# Patient Record
Sex: Female | Born: 1950 | Race: White | Hispanic: No | Marital: Married | Smoking: Former smoker
Health system: Southern US, Community
[De-identification: ages and names within clinical notes are randomized; demographics above are authoritative.]

## PROBLEM LIST (undated history)

## (undated) DIAGNOSIS — I519 Heart disease, unspecified: Secondary | ICD-10-CM

## (undated) DIAGNOSIS — E079 Disorder of thyroid, unspecified: Secondary | ICD-10-CM

## (undated) HISTORY — PX: PACEMAKER PLACEMENT: SHX43

## (undated) HISTORY — PX: CARDIAC DEFIBRILLATOR PLACEMENT: SHX171

## (undated) HISTORY — DX: Heart disease, unspecified: I51.9

## (undated) HISTORY — DX: Disorder of thyroid, unspecified: E07.9

## (undated) HISTORY — PX: CARDIAC ELECTROPHYSIOLOGY STUDY AND ABLATION: SHX1294

---

## 2008-07-22 ENCOUNTER — Ambulatory Visit: Payer: Self-pay | Admitting: Diagnostic Radiology

## 2008-07-22 ENCOUNTER — Emergency Department (HOSPITAL_BASED_OUTPATIENT_CLINIC_OR_DEPARTMENT_OTHER): Admission: EM | Admit: 2008-07-22 | Discharge: 2008-07-22 | Payer: Self-pay | Admitting: Emergency Medicine

## 2009-05-12 LAB — TSH: TSH: 2.08 u[IU]/mL (ref 0.41–5.90)

## 2009-05-12 LAB — LIPID PANEL: Triglycerides: 91 mg/dL (ref 40–160)

## 2009-05-12 LAB — HEPATIC FUNCTION PANEL
ALT: 22 U/L (ref 7–35)
AST: 18 U/L (ref 13–35)

## 2010-06-05 DIAGNOSIS — M79609 Pain in unspecified limb: Secondary | ICD-10-CM | POA: Insufficient documentation

## 2010-06-05 DIAGNOSIS — G609 Hereditary and idiopathic neuropathy, unspecified: Secondary | ICD-10-CM | POA: Insufficient documentation

## 2010-06-05 LAB — HEMOGLOBIN A1C: Hgb A1c MFr Bld: 5.9 % (ref 4.0–6.0)

## 2010-06-06 DIAGNOSIS — E559 Vitamin D deficiency, unspecified: Secondary | ICD-10-CM | POA: Insufficient documentation

## 2011-12-02 ENCOUNTER — Ambulatory Visit (INDEPENDENT_AMBULATORY_CARE_PROVIDER_SITE_OTHER): Payer: Medicare Other | Admitting: Family Medicine

## 2011-12-02 ENCOUNTER — Encounter: Payer: Self-pay | Admitting: Family Medicine

## 2011-12-02 VITALS — BP 120/73 | HR 91 | Ht 64.0 in | Wt 237.0 lb

## 2011-12-02 DIAGNOSIS — I519 Heart disease, unspecified: Secondary | ICD-10-CM

## 2011-12-02 DIAGNOSIS — E559 Vitamin D deficiency, unspecified: Secondary | ICD-10-CM

## 2011-12-02 DIAGNOSIS — F341 Dysthymic disorder: Secondary | ICD-10-CM

## 2011-12-02 DIAGNOSIS — E039 Hypothyroidism, unspecified: Secondary | ICD-10-CM

## 2011-12-02 DIAGNOSIS — F418 Other specified anxiety disorders: Secondary | ICD-10-CM

## 2011-12-02 DIAGNOSIS — Z13220 Encounter for screening for lipoid disorders: Secondary | ICD-10-CM

## 2011-12-02 DIAGNOSIS — Z950 Presence of cardiac pacemaker: Secondary | ICD-10-CM

## 2011-12-02 MED ORDER — FLUOXETINE HCL 20 MG PO CAPS: 20.0000 mg | ORAL_CAPSULE | Freq: Every day | ORAL | Status: DC

## 2011-12-02 NOTE — Patient Instructions (Signed)
Please go for your labs in June. Fast for 8-9 hours before you go Schedule your welcome to medicare physical in June.

## 2011-12-02 NOTE — Progress Notes (Signed)
Subjective:    Patient ID: Robin Murillo, female    DOB: 03/24/1951, 61 y.o.   MRN: [id number]  HPI Says in Dec was having syncopal episodes and went to hosp and tranferred to [facility]. Had a cardioversion adn says she now has pacemeker and defibrillator.  Sees. Dr. Milas Gain at [facility].  Now has Medicare and would like to establish care.  Anxiety/Depression - Ran out of fluoxetine one month ago and has been irritable and hard to live with. They inc her xanax to TID when hosp in Dec says now just takes it about once a day.    Review of Systems  BP 120/73  Pulse 91  Ht 5\' 4"  (1.626 m)  Wt 237 lb (107.502 kg)  BMI 40.68 kg/m2    No Known Allergies  Past Medical History  Diagnosis Date  . Heart disease     pacemaker/defiberlator  . Thyroid disease     Past Surgical History  Procedure Date  . Pacemaker placement     06/2011 at [facility]  . Cardiac defibrillator placement     06/2011 at [facility]  . Cardiac electrophysiology study and ablation     06/2011    History   Social History  . Marital Status: Married    Spouse Name: Maurine Minister    Number of Children: 3  . Years of Education: GED   Occupational History  . disability    Social History Main Topics  . Smoking status: Former Smoker    Quit date: 04/04/2003  . Smokeless tobacco: Not on file  . Alcohol Use: No  . Drug Use: No  . Sexually Active: Not on file   Other Topics Concern  . Not on file   Social History Narrative   1 caffeine dirink per day. No regular exercise.     Family History  Problem Relation Age of Onset  . Heart attack Brother   . Heart disease Sister     Murmur  . Thyroid disease Sister     Outpatient Encounter Prescriptions as of 12/02/2011  Medication Sig Dispense Refill  . ALPRAZolam (XANAX) 0.5 MG tablet Take 0.5 mg by mouth 3 (three) times daily as needed.      Marland Kitchen aspirin 81 MG tablet Take 81 mg by mouth daily.      . Cholecalciferol (VITAMIN D PO) Take 2,000 Int'l Units  by mouth daily.       Marland Kitchen FLUoxetine (PROZAC) 20 MG capsule Take 1 capsule (20 mg total) by mouth daily.  30 capsule  6  . levothyroxine (SYNTHROID, LEVOTHROID) 112 MCG tablet Take 112 mcg by mouth daily.      . magnesium oxide (MAG-OX) 400 MG tablet Take 400 mg by mouth 2 (two) times daily.      . metoprolol (LOPRESSOR) 50 MG tablet Take 50 mg by mouth 2 (two) times daily.      Marland Kitchen DISCONTD: FLUoxetine (PROZAC) 20 MG capsule Take 20 mg by mouth daily.      Marland Kitchen DISCONTD: metoprolol succinate (TOPROL-XL) 50 MG 24 hr tablet Take 50 mg by mouth daily. Take with or immediately following a meal.              Objective:   Physical Exam  Constitutional: She is oriented to person, place, and time. She appears well-developed and well-nourished.  HENT:  Head: Normocephalic and atraumatic.  Neck: Neck supple. No thyromegaly present.  Cardiovascular: Normal rate, regular rhythm and normal heart sounds.  No carotid bruits.   Pulmonary/Chest: Effort normal and breath sounds normal.  Musculoskeletal: She exhibits no edema.  Lymphadenopathy:    She has no cervical adenopathy.  Neurological: She is alert and oriented to person, place, and time.  Skin: Skin is warm and dry.  Psychiatric: She has a normal mood and affect. Her behavior is normal.          Assessment & Plan:  Anxiety/Depression  -- will restart her fluxoetine. Adjust dose is needed at her f/u in June.  She uses her xanax about once a day on average  Hypothyroid - Due to recheck TSH. She is not sure if dose is correct or not.   Did encourage her to schedule a Medicare wellness exam so we can catch her up on her preventative care including mammogram and vaccines. The last time she had her office visit was about a year and half ago.  I will hopefully get records and the next month regarding her cardiac condition. We may end up needing to call [facility] cardiology to get better last records secondary define her condition.  She  also has a prior history of vitamin D deficiency. She was supposed to take 2000 international units daily. She is due to recheck that to that we can see if she would be a candidate for maintenance therapy.

## 2011-12-04 ENCOUNTER — Encounter: Payer: Self-pay | Admitting: Family Medicine

## 2011-12-04 DIAGNOSIS — E119 Type 2 diabetes mellitus without complications: Secondary | ICD-10-CM | POA: Insufficient documentation

## 2011-12-04 LAB — VITAMIN D 25 HYDROXY (VIT D DEFICIENCY, FRACTURES): Vit D, 25-Hydroxy: 15.1

## 2011-12-06 ENCOUNTER — Encounter: Payer: Self-pay | Admitting: *Deleted

## 2011-12-20 LAB — COMPLETE METABOLIC PANEL WITH GFR
Albumin: 4.2 g/dL (ref 3.5–5.2)
Alkaline Phosphatase: 221 U/L — ABNORMAL HIGH (ref 39–117)
CO2: 26 mEq/L (ref 19–32)
GFR, Est Non African American: 72 mL/min
Glucose, Bld: 97 mg/dL (ref 70–99)
Potassium: 4.4 mEq/L (ref 3.5–5.3)
Sodium: 139 mEq/L (ref 135–145)
Total Protein: 7.7 g/dL (ref 6.0–8.3)

## 2011-12-20 LAB — TSH: TSH: 3.23 u[IU]/mL (ref 0.350–4.500)

## 2011-12-20 LAB — VITAMIN D 25 HYDROXY (VIT D DEFICIENCY, FRACTURES): Vit D, 25-Hydroxy: 39 ng/mL (ref 30–89)

## 2011-12-20 LAB — LIPID PANEL: Total CHOL/HDL Ratio: 4 Ratio

## 2011-12-24 ENCOUNTER — Telehealth: Payer: Self-pay | Admitting: *Deleted

## 2011-12-24 ENCOUNTER — Ambulatory Visit (INDEPENDENT_AMBULATORY_CARE_PROVIDER_SITE_OTHER): Payer: Medicare Other | Admitting: Family Medicine

## 2011-12-24 ENCOUNTER — Encounter: Payer: Self-pay | Admitting: Family Medicine

## 2011-12-24 VITALS — BP 122/68 | HR 72 | Ht 64.0 in | Wt 235.0 lb

## 2011-12-24 DIAGNOSIS — Z0000 Encounter for general adult medical examination without abnormal findings: Secondary | ICD-10-CM

## 2011-12-24 DIAGNOSIS — Z1239 Encounter for other screening for malignant neoplasm of breast: Secondary | ICD-10-CM

## 2011-12-24 DIAGNOSIS — Z950 Presence of cardiac pacemaker: Secondary | ICD-10-CM | POA: Insufficient documentation

## 2011-12-24 DIAGNOSIS — F418 Other specified anxiety disorders: Secondary | ICD-10-CM

## 2011-12-24 DIAGNOSIS — Z1211 Encounter for screening for malignant neoplasm of colon: Secondary | ICD-10-CM

## 2011-12-24 DIAGNOSIS — Z9181 History of falling: Secondary | ICD-10-CM

## 2011-12-24 DIAGNOSIS — E039 Hypothyroidism, unspecified: Secondary | ICD-10-CM

## 2011-12-24 MED ORDER — AMBULATORY NON FORMULARY MEDICATION: Status: DC

## 2011-12-24 MED ORDER — ALPRAZOLAM 0.5 MG PO TABS: 0.5000 mg | ORAL_TABLET | Freq: Three times a day (TID) | ORAL | Status: DC | PRN

## 2011-12-24 MED ORDER — LEVOTHYROXINE SODIUM 112 MCG PO TABS: 112.0000 ug | ORAL_TABLET | Freq: Every day | ORAL | Status: DC

## 2011-12-24 NOTE — Progress Notes (Signed)
Subjective:    Robin Murillo is a 61 y.o. female who presents for Medicare Annual/Subsequent preventive examination.  Preventive Screening-Counseling & Management  Tobacco History  Smoking status  . Former Smoker  . Quit date: 04/04/2003  Smokeless tobacco  . Not on file     Problems Prior to Visit 1. Numbness in leg.   2. she is currently being followed by cardiology. She says she has a followup appointment in one to 2 weeks. They're monitoring her pacemaker.  Current Problems (verified) Patient Active Problem List  Diagnoses  . Hypothyroid  . Depression with anxiety  . Obesity, Class III, BMI 40-49.9 (morbid obesity)  . IFG (impaired fasting glucose)    Medications Prior to Visit Current Outpatient Prescriptions on File Prior to Visit  Medication Sig Dispense Refill  . ALPRAZolam (XANAX) 0.5 MG tablet Take 0.5 mg by mouth 3 (three) times daily as needed.      Marland Kitchen aspirin 81 MG tablet Take 81 mg by mouth daily.      . Cholecalciferol (VITAMIN D PO) Take 2,000 Int'l Units by mouth daily.       Marland Kitchen FLUoxetine (PROZAC) 20 MG capsule Take 1 capsule (20 mg total) by mouth daily.  30 capsule  6  . levothyroxine (SYNTHROID, LEVOTHROID) 112 MCG tablet Take 112 mcg by mouth daily.      . magnesium oxide (MAG-OX) 400 MG tablet Take 400 mg by mouth 2 (two) times daily.      . metoprolol (LOPRESSOR) 50 MG tablet Take 50 mg by mouth 2 (two) times daily.        Current Medications (verified) Current Outpatient Prescriptions  Medication Sig Dispense Refill  . ALPRAZolam (XANAX) 0.5 MG tablet Take 0.5 mg by mouth 3 (three) times daily as needed.      Marland Kitchen aspirin 81 MG tablet Take 81 mg by mouth daily.      . Cholecalciferol (VITAMIN D PO) Take 2,000 Int'l Units by mouth daily.       Marland Kitchen FLUoxetine (PROZAC) 20 MG capsule Take 1 capsule (20 mg total) by mouth daily.  30 capsule  6  . levothyroxine (SYNTHROID, LEVOTHROID) 112 MCG tablet Take 112 mcg by mouth daily.      . magnesium oxide  (MAG-OX) 400 MG tablet Take 400 mg by mouth 2 (two) times daily.      . metoprolol (LOPRESSOR) 50 MG tablet Take 50 mg by mouth 2 (two) times daily.         Allergies (verified) Review of patient's allergies indicates no known allergies.   PAST HISTORY  Family History Family History  Problem Relation Age of Onset  . Heart attack Brother   . Heart disease Sister     Murmur  . Thyroid disease Sister     Social History History  Substance Use Topics  . Smoking status: Former Smoker    Quit date: 04/04/2003  . Smokeless tobacco: Not on file  . Alcohol Use: No     Are there smokers in your home (other than you)? No  Risk Factors Current exercise habits: walks 2 x per [facility] for 30 min  Dietary issues discussed: none   Cardiac risk factors: obesity (BMI >= 30 kg/m2).  Depression Screen (Note: if answer to either of the following is "Yes", a more complete depression screening is indicated)   Over the past two weeks, have you felt down, depressed or hopeless? Yes  Over the past two weeks, have you felt little interest or pleasure  in doing things? Yes  Have you lost interest or pleasure in daily life? Yes  Do you often feel hopeless? Yes  Do you cry easily over simple problems? No  Activities of Daily Living In your present state of health, do you have any difficulty performing the following activities?:  Driving? No Managing money?  No Feeding yourself? No Getting from bed to chair? No Climbing a flight of stairs? No Preparing food and eating?: No Bathing or showering? No Getting dressed: No Getting to the toilet? No Using the toilet:No Moving around from place to place: No In the past year have you fallen or had a near fall?:No   Are you sexually active?  No  Do you have more than one partner?  No  Hearing Difficulties: No Do you often ask people to speak up or repeat themselves? No Do you experience ringing or noises in your ears? Yes Do you have difficulty  understanding soft or whispered voices? No   Do you feel that you have a problem with memory? Yes  Do you often misplace items? No  Do you feel safe at home?  Yes  Cognitive Testing  Alert? Yes  Normal Appearance?Yes  Oriented to person? Yes  Place? Yes   Time? Yes  Recall of three objects?  Yes  Can perform simple calculations? Yes  Displays appropriate judgment?Yes  Can read the correct time from a watch face?Yes    List the Names of Other Physician/Practitioners you currently use: 1.  She has a cardiologist. Not sure of name.  Indicate any recent Medical Services you may have received from other than [facility] providers in the past year (date may be approximate).  Immunization History  Administered Date(s) Administered  . Tdap 05/10/2008    Screening Tests Health Maintenance  Topic Date Due  . Colonoscopy  10/23/2000  . Zostavax  10/24/2010  . Mammogram  07/18/2011  . Influenza Vaccine  04/14/2012  . Tetanus/tdap  05/10/2018    All answers were reviewed with the patient and necessary referrals were made:  Robin Struthers, MD   12/24/2011   History reviewed: allergies, current medications, past family history, past medical history, past social history, past surgical history and problem list  Review of Systems A comprehensive review of systems was negative.    Objective:     Vision by Snellen chart: right eye:20/40, left eye:20/40  Body mass index is 40.34 kg/(m^2). BP 122/68  Pulse 72  Ht 5\' 4"  (1.626 m)  Wt 235 lb (106.595 kg)  BMI 40.34 kg/m2  SpO2 92%  BP 122/68  Pulse 72  Ht 5\' 4"  (1.626 m)  Wt 235 lb (106.595 kg)  BMI 40.34 kg/m2  SpO2 92% General appearance: alert, cooperative, appears stated age and mildly obese Head: Normocephalic, without obvious abnormality, atraumatic Eyes: conj clear, EOMi PEERLA Ears: normal TM's and external ear canals both ears Nose: Nares normal. Septum midline. Mucosa normal. No drainage or sinus tenderness. Throat:  lips, mucosa, and tongue normal; teeth and gums normal Neck: no adenopathy, no carotid bruit, no JVD, supple, symmetrical, trachea midline and thyroid not enlarged, symmetric, no tenderness/mass/nodules Back: symmetric, no curvature. ROM normal. No CVA tenderness. Lungs: clear to auscultation bilaterally Breasts: normal appearance, no masses or tenderness, top of the pacemaker on left upper chest. Incision well-healed. It is tender. Heart: regular rate and rhythm, S1, S2 normal, no murmur, click, rub or gallop Abdomen: soft, non-tender; bowel sounds normal; no masses,  no organomegaly Extremities: extremities normal, atraumatic, no cyanosis  or edema Pulses: 2+ and symmetric Skin: Skin color, texture, turgor normal. No rashes or lesions Lymph nodes: Cervical, supraclavicular, and axillary nodes normal. Neurologic: Alert and oriented X 3, normal strength and tone. Normal symmetric reflexes. Normal coordination and gait     Assessment:     Annual wellness medicare exam     Plan:     During the course of the visit the patient was educated and counseled about appropriate screening and preventive services including:    Screening mammography  Colorectal cancer screening  Shingles vaccine.  Prescription given to that she can get the vaccine at the pharmacy or Medicare part D.  Screen + for depression. PHQ- 9 score of 12 (moderate depression).  We discussed the options of counseling versus possibly a medication. I encouraged her strongly think about the counseling. She is going through some medical problems currently and her husband is as well Mrs. been very stressful for her. She says she will think about it. She does have Xanax to use as needed. Though she may benefit from an SSRI for her more depressive type symptoms but she wants to hold off at this time.  I aksed her to please have her cardioloist send records since we have none on file.   Diet review for nutrition referral? Yes ____   Not Indicated __x__   Patient Instructions (the written plan) was given to the patient.  Medicare Attestation I have personally reviewed: The patient's medical and social history Their use of alcohol, tobacco or illicit drugs Their current medications and supplements The patient's functional ability including ADLs,fall risks, home safety risks, cognitive, and hearing and visual impairment Diet and physical activities Evidence for depression or mood disorders  The patient's weight, height, BMI, and visual acuity have been recorded in the chart.  I have made referrals, counseling, and provided education to the patient based on review of the above and I have provided the patient with a written personalized care plan for preventive services.     Robin Bellanger, MD   12/24/2011

## 2011-12-24 NOTE — Patient Instructions (Addendum)
Please have your cardiologist send [place] a copy of the note.   We will call you with your mammogram.

## 2011-12-24 NOTE — Telephone Encounter (Signed)
Pt states that she needs refills on her xanax and synthroid. States they were previously filled by [facility]. Please advise if you will give refill. Pt states her provider there retired in January.

## 2011-12-24 NOTE — Telephone Encounter (Signed)
OK to fill

## 2012-02-25 ENCOUNTER — Telehealth: Payer: Self-pay | Admitting: *Deleted

## 2012-02-25 NOTE — Telephone Encounter (Signed)
Pt called at 11:20 am and states that she wants [place] to send an rx for an inhaler to pharmacy b/c she has been having extreme SHOB. Informed pt she would need an appt and I could get her in right after lunch. Pt stated she couldn't come today so we made appt for tomorrow. Pt has just called and cancelled appt for tomorrow.

## 2012-02-26 ENCOUNTER — Ambulatory Visit: Payer: BLUE CROSS/BLUE SHIELD | Admitting: Family Medicine

## 2012-03-24 ENCOUNTER — Other Ambulatory Visit: Payer: Self-pay | Admitting: Family Medicine

## 2012-06-22 ENCOUNTER — Other Ambulatory Visit: Payer: Self-pay | Admitting: Family Medicine

## 2012-06-30 ENCOUNTER — Other Ambulatory Visit: Payer: Self-pay | Admitting: Family Medicine

## 2012-09-18 ENCOUNTER — Other Ambulatory Visit: Payer: Self-pay | Admitting: Family Medicine

## 2012-09-21 ENCOUNTER — Other Ambulatory Visit: Payer: Self-pay

## 2012-09-21 MED ORDER — LEVOTHYROXINE SODIUM 112 MCG PO TABS: 112.0000 ug | ORAL_TABLET | Freq: Every day | ORAL | Status: DC

## 2012-09-21 NOTE — Telephone Encounter (Signed)
[name] called to schedule an appointment. Medication sent to pharmacy.

## 2012-09-25 ENCOUNTER — Ambulatory Visit: Payer: BLUE CROSS/BLUE SHIELD

## 2012-09-25 ENCOUNTER — Encounter: Payer: Self-pay | Admitting: Family Medicine

## 2012-09-25 ENCOUNTER — Ambulatory Visit (INDEPENDENT_AMBULATORY_CARE_PROVIDER_SITE_OTHER): Payer: Medicare Other | Admitting: Family Medicine

## 2012-09-25 VITALS — BP 118/62 | HR 73 | Ht 64.0 in | Wt 237.0 lb

## 2012-09-25 DIAGNOSIS — Z1231 Encounter for screening mammogram for malignant neoplasm of breast: Secondary | ICD-10-CM

## 2012-09-25 DIAGNOSIS — R7301 Impaired fasting glucose: Secondary | ICD-10-CM

## 2012-09-25 DIAGNOSIS — J309 Allergic rhinitis, unspecified: Secondary | ICD-10-CM

## 2012-09-25 DIAGNOSIS — E039 Hypothyroidism, unspecified: Secondary | ICD-10-CM

## 2012-09-25 MED ORDER — ALPRAZOLAM 0.5 MG PO TABS: 0.5000 mg | ORAL_TABLET | Freq: Three times a day (TID) | ORAL | Status: DC | PRN

## 2012-09-25 MED ORDER — FLUOXETINE HCL 20 MG PO CAPS: 20.0000 mg | ORAL_CAPSULE | Freq: Every day | ORAL | Status: DC

## 2012-09-25 MED ORDER — LEVOTHYROXINE SODIUM 112 MCG PO TABS: 112.0000 ug | ORAL_TABLET | Freq: Every day | ORAL | Status: DC

## 2012-09-25 MED ORDER — MOMETASONE FUROATE 50 MCG/ACT NA SUSP: 2.0000 | Freq: Every day | NASAL | Status: DC

## 2012-09-25 NOTE — Progress Notes (Signed)
  Subjective:    Patient ID: Robin Murillo, female    DOB: 17-Apr-1951, 62 y.o.   MRN: [id number]  HPI Hypothyroid - no weight, skin or hair changes.  Taking synthroid in the AM before breakfast on an empty stomach. No problems with the medication.  IFG - Due to recheck A1C. Brother is now diagnosed with DM.   Depression with anxiety- Needs refill on her xanax today. Overall she feels she's doing well and that her mood is stable. No recent symptoms or complaints.  AR- in the past given nasonex and worked well. Wants a rx.  She wants and if there's something stronger than Allegra that she can use. She has not been using it recently for allergies.   Review of Systems     Objective:   Physical Exam  Constitutional: She is oriented to person, place, and time. She appears well-developed and well-nourished.  HENT:  Head: Normocephalic and atraumatic.  Neck: Neck supple. No thyromegaly present.  Cardiovascular: Normal rate, regular rhythm and normal heart sounds.   Pulmonary/Chest: Effort normal and breath sounds normal.  Lymphadenopathy:    She has no cervical adenopathy.  Neurological: She is alert and oriented to person, place, and time.  Skin: Skin is warm and dry.  Psychiatric: She has a normal mood and affect. Her behavior is normal.          Assessment & Plan:  Hypothyroid - Recheck TSH.  She's doing well. His repeat TSH is normal then repeat in one year.  IFG - Due to recheck A1C today. Avoid concentrated sweets and watch portions on carbs.  Wil recheck in 6 months.   Lab Results  Component Value Date   HGBA1C 6.1 09/25/2012   Obesity - encouraged more regular exercise and weigh tloss.   AR- Can restart Allegra and will call in nasonex. Given coupon.   Depression with anxiety-doing well on her Xanax. I did refill it today. No changes to her regimen.  Overdue for mammogram. Ordered mammogram screening.  She declined a colonoscopy. We made a referral but then she said  she got scared and wanted to it. She was fearful because she has a pacemaker. I explained her that it is more light sedation but certainly we could to do the stool cards for now and discuss again at her well exam in June.

## 2012-09-25 NOTE — Patient Instructions (Addendum)
Check with your insurance to see if the shingles vaccine is covered.

## 2012-10-01 ENCOUNTER — Other Ambulatory Visit: Payer: Self-pay | Admitting: Family Medicine

## 2012-10-01 ENCOUNTER — Ambulatory Visit (INDEPENDENT_AMBULATORY_CARE_PROVIDER_SITE_OTHER): Payer: Medicare Other

## 2012-10-01 DIAGNOSIS — Z1239 Encounter for other screening for malignant neoplasm of breast: Secondary | ICD-10-CM

## 2012-10-01 DIAGNOSIS — Z1231 Encounter for screening mammogram for malignant neoplasm of breast: Secondary | ICD-10-CM

## 2012-10-01 MED ORDER — LEVOTHYROXINE SODIUM 125 MCG PO TABS: 125.0000 ug | ORAL_TABLET | Freq: Every day | ORAL | Status: DC

## 2012-10-02 ENCOUNTER — Telehealth: Payer: Self-pay | Admitting: *Deleted

## 2012-10-02 NOTE — Telephone Encounter (Signed)
Returning call to pt and she wanted to verify that her rx had been sent to [facility] in [place].Laureen Ochs, Viann Shove

## 2012-10-15 ENCOUNTER — Other Ambulatory Visit: Payer: Self-pay | Admitting: Family Medicine

## 2012-10-15 NOTE — Telephone Encounter (Signed)
Looked in meds and xanax seems early not due until 4/14. Please check behind med. Thanks. Barry Dienes, LPN

## 2012-10-15 NOTE — Telephone Encounter (Signed)
I agree. 90 she actually lost her way more than a month. The last prescription of 90 lasted her over 6 months. I will decline the medication.

## 2012-12-01 ENCOUNTER — Other Ambulatory Visit: Payer: Self-pay | Admitting: *Deleted

## 2012-12-01 ENCOUNTER — Telehealth: Payer: Self-pay | Admitting: *Deleted

## 2012-12-01 DIAGNOSIS — E039 Hypothyroidism, unspecified: Secondary | ICD-10-CM

## 2012-12-01 LAB — TSH: TSH: 0.788 u[IU]/mL (ref 0.350–4.500)

## 2012-12-01 NOTE — Telephone Encounter (Signed)
Order faxed to lab.Robin Murillo [place]

## 2012-12-02 NOTE — Progress Notes (Signed)
Quick Note:  All labs are normal. ______ 

## 2013-01-13 ENCOUNTER — Ambulatory Visit (INDEPENDENT_AMBULATORY_CARE_PROVIDER_SITE_OTHER): Payer: Medicare Other | Admitting: Family Medicine

## 2013-01-13 ENCOUNTER — Encounter: Payer: Self-pay | Admitting: Family Medicine

## 2013-01-13 ENCOUNTER — Other Ambulatory Visit: Payer: Self-pay | Admitting: Family Medicine

## 2013-01-13 VITALS — BP 126/73 | HR 86 | Ht 64.0 in | Wt 235.0 lb

## 2013-01-13 DIAGNOSIS — R7301 Impaired fasting glucose: Secondary | ICD-10-CM

## 2013-01-13 DIAGNOSIS — R0609 Other forms of dyspnea: Secondary | ICD-10-CM

## 2013-01-13 DIAGNOSIS — R0683 Snoring: Secondary | ICD-10-CM

## 2013-01-13 DIAGNOSIS — F341 Dysthymic disorder: Secondary | ICD-10-CM

## 2013-01-13 DIAGNOSIS — R5381 Other malaise: Secondary | ICD-10-CM

## 2013-01-13 DIAGNOSIS — I10 Essential (primary) hypertension: Secondary | ICD-10-CM

## 2013-01-13 DIAGNOSIS — F418 Other specified anxiety disorders: Secondary | ICD-10-CM

## 2013-01-13 DIAGNOSIS — Z0000 Encounter for general adult medical examination without abnormal findings: Secondary | ICD-10-CM

## 2013-01-13 DIAGNOSIS — R0989 Other specified symptoms and signs involving the circulatory and respiratory systems: Secondary | ICD-10-CM

## 2013-01-13 LAB — LIPID PANEL
Cholesterol: 176 mg/dL (ref 0–200)
Total CHOL/HDL Ratio: 3.5 Ratio

## 2013-01-13 LAB — COMPLETE METABOLIC PANEL WITH GFR
Albumin: 4.3 g/dL (ref 3.5–5.2)
Alkaline Phosphatase: 285 U/L — ABNORMAL HIGH (ref 39–117)
BUN: 11 mg/dL (ref 6–23)
CO2: 28 mEq/L (ref 19–32)
Calcium: 9.9 mg/dL (ref 8.4–10.5)
Chloride: 103 mEq/L (ref 96–112)
GFR, Est Non African American: 65 mL/min
Glucose, Bld: 90 mg/dL (ref 70–99)
Potassium: 5.1 mEq/L (ref 3.5–5.3)
Sodium: 140 mEq/L (ref 135–145)
Total Protein: 7.8 g/dL (ref 6.0–8.3)

## 2013-01-13 LAB — POCT GLYCOSYLATED HEMOGLOBIN (HGB A1C): Hemoglobin A1C: 5.8

## 2013-01-13 MED ORDER — FLUOXETINE HCL 40 MG PO CAPS: 40.0000 mg | ORAL_CAPSULE | Freq: Every day | ORAL | Status: DC

## 2013-01-13 NOTE — Progress Notes (Signed)
Subjective:    Robin Murillo is a 62 y.o. female who presents for Medicare Annual/Subsequent preventive examination.  Preventive Screening-Counseling & Management  Tobacco History  Smoking status  . Former Smoker  . Quit date: 04/04/2003  Smokeless tobacco  . Not on file     Problems Prior to Visit 1.   Current Problems (verified) Patient Active Problem List   Diagnosis Date Noted  . Pacemaker 12/24/2011  . Obesity, Class III, BMI 40-49.9 (morbid obesity) 12/04/2011  . IFG (impaired fasting glucose) 12/04/2011  . Hypothyroid 12/02/2011  . Depression with anxiety 12/02/2011    Medications Prior to Visit Current Outpatient Prescriptions on File Prior to Visit  Medication Sig Dispense Refill  . ALPRAZolam (XANAX) 0.5 MG tablet Take 1 tablet (0.5 mg total) by mouth 3 (three) times daily as needed.  90 tablet  0  . AMBULATORY NON FORMULARY MEDICATION Medication Name: Shingles vaccine IM x 1.  1 vial  0  . aspirin 81 MG tablet Take 81 mg by mouth daily.      . Cholecalciferol (VITAMIN D PO) Take 2,000 Int'l Units by mouth daily.       Marland Kitchen FLUoxetine (PROZAC) 20 MG capsule Take 1 capsule (20 mg total) by mouth daily.  90 capsule  1  . levothyroxine (SYNTHROID, LEVOTHROID) 125 MCG tablet TAKE 1 TABLET DAILY.  90 tablet  1  . magnesium oxide (MAG-OX) 400 MG tablet Take 400 mg by mouth 2 (two) times daily.      . metoprolol (LOPRESSOR) 50 MG tablet Take 50 mg by mouth 2 (two) times daily.      . mometasone (NASONEX) 50 MCG/ACT nasal spray Place 2 sprays into the nose daily.  17 g  12   No current facility-administered medications on file prior to visit.    Current Medications (verified) Current Outpatient Prescriptions  Medication Sig Dispense Refill  . ALPRAZolam (XANAX) 0.5 MG tablet Take 1 tablet (0.5 mg total) by mouth 3 (three) times daily as needed.  90 tablet  0  . AMBULATORY NON FORMULARY MEDICATION Medication Name: Shingles vaccine IM x 1.  1 vial  0  . aspirin 81 MG  tablet Take 81 mg by mouth daily.      . Cholecalciferol (VITAMIN D PO) Take 2,000 Int'l Units by mouth daily.       Marland Kitchen FLUoxetine (PROZAC) 20 MG capsule Take 1 capsule (20 mg total) by mouth daily.  90 capsule  1  . levothyroxine (SYNTHROID, LEVOTHROID) 125 MCG tablet TAKE 1 TABLET DAILY.  90 tablet  1  . magnesium oxide (MAG-OX) 400 MG tablet Take 400 mg by mouth 2 (two) times daily.      . metoprolol (LOPRESSOR) 50 MG tablet Take 50 mg by mouth 2 (two) times daily.      . mometasone (NASONEX) 50 MCG/ACT nasal spray Place 2 sprays into the nose daily.  17 g  12   No current facility-administered medications for this visit.     Allergies (verified) Review of patient's allergies indicates no known allergies.   PAST HISTORY  Family History Family History  Problem Relation Age of Onset  . Heart attack Brother   . Heart disease Sister     Murmur  . Thyroid disease Sister   . Diabetes Brother     Social History History  Substance Use Topics  . Smoking status: Former Smoker    Quit date: 04/04/2003  . Smokeless tobacco: Not on file  . Alcohol Use: No  Are there smokers in your home (other than you)? No  Risk Factors Current exercise habits: walking  Dietary issues discussed: none   Cardiac risk factors: obesity (BMI >= 30 kg/m2) and sedentary lifestyle.  Depression Screen (Note: if answer to either of the following is "Yes", a more complete depression screening is indicated)   Over the past two weeks, have you felt down, depressed or hopeless? Yes  Over the past two weeks, have you felt little interest or pleasure in doing things? Yes  Have you lost interest or pleasure in daily life? No  Do you often feel hopeless? No  Do you cry easily over simple problems? No  Activities of Daily Living In your present state of health, do you have any difficulty performing the following activities?:  Driving? No Managing money?  No Feeding yourself? No Getting from bed to  chair? Yes Climbing a flight of stairs? Yes Preparing food and eating?: Yes Bathing or showering? No Getting dressed: No Getting to the toilet? No Using the toilet:No Moving around from place to place: No In the past year have you fallen or had a near fall?:Yes   Are you sexually active?  No  Do you have more than one partner?  No  Hearing Difficulties: No Do you often ask people to speak up or repeat themselves? No Do you experience ringing or noises in your ears? No Do you have difficulty understanding soft or whispered voices? No   Do you feel that you have a problem with memory? Yes  Do you often misplace items? No  Do you feel safe at home?  Yes  Cognitive Testing  Alert? Yes  Normal Appearance?Yes  Oriented to person? Yes  Place? Yes   Time? Yes  Recall of three objects?  Yes  Can perform simple calculations? Yes  Displays appropriate judgment?Yes  Can read the correct time from a watch face?Yes   Advanced Directives have been discussed with the patient? No  List the Names of Other Physician/Practitioners you currently use: 1.  Dr Clovis Riley, [facility] cardiology)  Indicate any recent Medical Services you may have received from other than [facility] providers in the past year (date may be approximate).  Immunization History  Administered Date(s) Administered  . Td 05/10/2008  . Tdap 05/10/2008  . Zoster 10/01/2012    Screening Tests Health Maintenance  Topic Date Due  . Colonoscopy  09/25/2013  . Influenza Vaccine  03/15/2013  . Mammogram  10/02/2014  . Tetanus/tdap  05/10/2018  . Zostavax  Completed    All answers were reviewed with the patient and necessary referrals were made:  Jariel Drost, MD   01/13/2013   History reviewed: allergies, current medications, past family history, past medical history, past social history, past surgical history and problem list  Review of Systems A comprehensive review of systems was negative.    Objective:     Vision  done 8 mo ago.  Body mass index is 40.32 kg/(m^2). BP 126/73  Pulse 86  Ht 5\' 4"  (1.626 m)  Wt 235 lb (106.595 kg)  BMI 40.32 kg/m2  BP 126/73  Pulse 86  Ht 5\' 4"  (1.626 m)  Wt 235 lb (106.595 kg)  BMI 40.32 kg/m2  General Appearance:    Alert, cooperative, no distress, appears stated age  Head:    Normocephalic, without obvious abnormality, atraumatic  Eyes:    PERRL, conjunctiva/corneas clear, EOM's intact, both eyes  Ears:    Normal TM's and external ear canals, both ears  Nose:   Nares normal, septum midline, mucosa normal, no drainage    or sinus tenderness  Throat:   Lips, mucosa, and tongue normal; teeth and gums normal  Neck:   Supple, symmetrical, trachea midline, no adenopathy;    thyroid:  no enlargement/tenderness/nodules; no carotid   bruit or JVD  Back:     Symmetric, no curvature, ROM normal, no CVA tenderness  Lungs:     Clear to auscultation bilaterally, respirations unlabored  Chest Wall:    No tenderness or deformity   Heart:    Regular rate and rhythm, S1 and S2 normal, no murmur, rub   or gallop  Breast Exam:    No tenderness, masses, or nipple abnormality  Abdomen:     Soft, non-tender, bowel sounds active all four quadrants,    no masses, no organomegaly  Genitalia:    Not peformed  Rectal:    Not performed  Extremities:   Extremities normal, atraumatic, no cyanosis or edema  Pulses:   2+ and symmetric all extremities  Skin:   Skin color, texture, turgor normal, no rashes or lesions  Lymph nodes:   Cervical, supraclavicular, and axillary nodes normal  Neurologic:   CNII-XII intact, normal strength, sensation and reflexes    throughout       Assessment:     Annual Wellness Exam      Plan:     During the course of the visit the patient was educated and counseled about appropriate screening and preventive services including:    Colorectal cancer screening - she doesn't want to do the scolonoscopy so will give her school cards  Anxiety - her  anixety is uncontrolled and having some depressive sxs.  Will inc prozac to 40mg . Having to use her xanax more lately as her anxiety is uncontrolls. F/u in 4-6 weeks.   Snoring  - she wonders if she has sleep apnea.  STOP BANG questionnaire score of +5/8.  Next conference was 40.64 cm. Recommend referral for sleep study.  IFG- A1c is better today. Encouraged more walking and weight loss\  Need for advanced directives. Encouraged her to have been instructed in place.  Hyperthyroid-her TSH level was at goal in may  Pacemaker-has followup with cardiology later this month.   Former smoker, recommend spirometry.  Diet review for nutrition referral? Yes ____  Not Indicated __x_   Patient Instructions (the written plan) was given to the patient.  Medicare Attestation I have personally reviewed: The patient's medical and social history Their use of alcohol, tobacco or illicit drugs Their current medications and supplements The patient's functional ability including ADLs,fall risks, home safety risks, cognitive, and hearing and visual impairment Diet and physical activities Evidence for depression or mood disorders  The patient's weight, height, BMI, and visual acuity have been recorded in the chart.  I have made referrals, counseling, and provided education to the patient based on review of the above and I have provided the patient with a written personalized care plan for preventive services.     Chibuike Fleek, MD   01/13/2013

## 2013-01-13 NOTE — Patient Instructions (Addendum)
Keep up a regular exercise program and make sure you are eating a healthy diet Try to eat 4 servings of dairy a day, or if you are lactose intolerant take a calcium with vitamin D daily.  Your vaccines are up to date.   

## 2013-01-14 ENCOUNTER — Other Ambulatory Visit: Payer: Self-pay | Admitting: Family Medicine

## 2013-01-14 DIAGNOSIS — R748 Abnormal levels of other serum enzymes: Secondary | ICD-10-CM

## 2013-01-20 ENCOUNTER — Other Ambulatory Visit: Payer: Self-pay | Admitting: *Deleted

## 2013-01-20 DIAGNOSIS — R5383 Other fatigue: Secondary | ICD-10-CM

## 2013-01-20 DIAGNOSIS — R0683 Snoring: Secondary | ICD-10-CM

## 2013-01-20 DIAGNOSIS — I10 Essential (primary) hypertension: Secondary | ICD-10-CM

## 2013-01-20 DIAGNOSIS — R5381 Other malaise: Secondary | ICD-10-CM

## 2013-01-21 ENCOUNTER — Telehealth: Payer: Self-pay | Admitting: Family Medicine

## 2013-01-21 NOTE — Telephone Encounter (Signed)
Patient spouse called and stated that patient cancelled her appt with [facility] and wants to go to [facility] here in [place] so I have faxed the referral to their office at [facility] [phone] and requested they schedule with patient for a good appt time. Thanks

## 2013-01-28 LAB — AMYLASE: Amylase: 42 U/L (ref 0–105)

## 2013-01-29 LAB — PTH, INTACT AND CALCIUM: Calcium: 10 mg/dL (ref 8.4–10.5)

## 2013-02-03 ENCOUNTER — Other Ambulatory Visit: Payer: Self-pay | Admitting: *Deleted

## 2013-02-03 DIAGNOSIS — Z1211 Encounter for screening for malignant neoplasm of colon: Secondary | ICD-10-CM

## 2013-02-04 LAB — POC HEMOCCULT BLD/STL (HOME/3-CARD/SCREEN)
Card #1 Date: 7142014
Card #2 Date: 7162014
Card #2 Fecal Occult Blod, POC: NEGATIVE
Card #3 Date: 7172014
Card #3 Fecal Occult Blood, POC: NEGATIVE
Fecal Occult Blood, POC: NEGATIVE

## 2013-02-09 DIAGNOSIS — R0600 Dyspnea, unspecified: Secondary | ICD-10-CM | POA: Insufficient documentation

## 2013-02-10 ENCOUNTER — Encounter: Payer: Self-pay | Admitting: Family Medicine

## 2013-02-10 ENCOUNTER — Ambulatory Visit (INDEPENDENT_AMBULATORY_CARE_PROVIDER_SITE_OTHER): Payer: Medicare Other | Admitting: Family Medicine

## 2013-02-10 VITALS — BP 116/64 | HR 89 | Wt 234.0 lb

## 2013-02-10 DIAGNOSIS — E039 Hypothyroidism, unspecified: Secondary | ICD-10-CM

## 2013-02-10 DIAGNOSIS — R0609 Other forms of dyspnea: Secondary | ICD-10-CM

## 2013-02-10 DIAGNOSIS — F418 Other specified anxiety disorders: Secondary | ICD-10-CM

## 2013-02-10 DIAGNOSIS — F341 Dysthymic disorder: Secondary | ICD-10-CM

## 2013-02-10 DIAGNOSIS — E559 Vitamin D deficiency, unspecified: Secondary | ICD-10-CM

## 2013-02-10 DIAGNOSIS — R0683 Snoring: Secondary | ICD-10-CM

## 2013-02-10 NOTE — Progress Notes (Signed)
  Subjective:    Patient ID: Robin Murillo, female    DOB: 1950/12/24, 62 y.o.   MRN: [id number]  HPI Depression and Anxiety -- She is feeling better on the higher dose or prozac. No SE on the higher dose. She has not had ot use her xanax. She feels that her depressive symptoms are a little bit better.  Vit d def - hx of deficiency. Has been on 2000IU dialy. She wants to know if this is the correct dose or if she should try something different as she is almost out.  Nightime urinary incontinence - Will get urgency and not make it.  She will get up to urinate 3-4 times at night. Has been going on for 1-2 years. No fever or chills.  Occ leaks with coughing and sneezing. She has no problems initiating urination but says that her stream does not have a lot of force. But she says she's been that way her whole life. No dysuria or low back pain with this. No change in color or odor of the urine. She is a former smoker.  Has echo scheduled for next Friday for her SOB. Saw her cardiologist last week.  Note, she has her sleep study scheduled for next week.  Review of Systems     Objective:   Physical Exam  Constitutional: She is oriented to person, place, and time. She appears well-developed and well-nourished.  HENT:  Head: Normocephalic and atraumatic.  Right Ear: External ear normal.  Left Ear: External ear normal.  Nose: Nose normal.  Mouth/Throat: Oropharynx is clear and moist.  TMs and canals are clear.   Eyes: Conjunctivae and EOM are normal. Pupils are equal, round, and reactive to light.  Neck: Neck supple. No thyromegaly present.  Cardiovascular: Normal rate, regular rhythm and normal heart sounds.   Pulmonary/Chest: Effort normal and breath sounds normal. She has no wheezes.  Lymphadenopathy:    She has no cervical adenopathy.  Neurological: She is alert and oriented to person, place, and time.  Skin: Skin is warm and dry.  Psychiatric: She has a normal mood and affect.           Assessment & Plan:  Depression and anxiety-improved. Gad 7 score of 7, PHQ 9 score of  4 .  We'll continue current regimen for now. Call if any palms or side effects. Otherwise I would like to see her back in about 3 months for mood.   Hypothyroidism-due to recheck TSH today. Lab slip given.  Urge incontinence- we'll perform a urinalysis today. Explained that she can also work on limiting fluid intake at bedtime. Also can work on drinking less before bedtime and Kegel exercises to help give her a few more seconds to get to the bathroom so that she doesn't have any accidents. Given handout on this. We could refer her to more formal physical therapy for this if needed. There are medications that can be helpful as well but she is not interested in taking a pill at this time. And we can also consider sending her to urology for further evaluation to take a look at the bladder. Since it does sound like she also has decreased force of stream as well.   Vit d de - will recheck level today.  When runs out can change to maintaince dose .   Snoring-sleep study scheduled for next week. I'll have her followup once I have the results available. Thus I did not schedule a visit a specific followup time.

## 2013-02-10 NOTE — Patient Instructions (Addendum)
Kegel Exercises The goal of Kegel exercises is to isolate and exercise your pelvic floor muscles. These muscles act as a hammock that supports the rectum, vagina, small intestine, and uterus. As the muscles weaken, the hammock sags and these organs are displaced from their normal positions. Kegel exercises can strengthen your pelvic floor muscles and help you to improve bladder and bowel control, improve sexual response, and help reduce many problems and some discomfort during pregnancy. Kegel exercises can be done anywhere and at any time. HOW TO PERFORM KEGEL EXERCISES 1. Locate your pelvic floor muscles. To do this, squeeze (contract) the muscles that you use when you try to stop the flow of urine. You will feel a tightness in the vaginal area (women) and a tight lift in the rectal area (men and women). 2. When you begin, contract your pelvic muscles tight for 2 5 seconds, then relax them for 2 5 seconds. This is one set. Do 4 5 sets with a short pause in between. 3. Contract your pelvic muscles for 8 10 seconds, then relax them for 8 10 seconds. Do 4 5 sets. If you cannot contract your pelvic muscles for 8 10 seconds, try 5 7 seconds and work your way up to 8 10 seconds. Your goal is 4 5 sets of 10 contractions each day. Keep your stomach, buttocks, and legs relaxed during the exercises. Perform sets of both short and long contractions. Vary your positions. Perform these contractions 3 4 times per day. Perform sets while you are:   Lying in bed in the morning.  Standing at lunch.  Sitting in the late afternoon.  Lying in bed at night. You should do 40 50 contractions per day. Do not perform more Kegel exercises per day than recommended. Overexercising can cause muscle fatigue. Continue these exercises for for at least 15 20 weeks or as directed by your caregiver. Document Released: 06/17/2012 Document Reviewed: 03/26/2012 [facility] Patient Information 2014 [place], [place]. Urinary  Incontinence Your doctor wants you to have this information about urinary incontinence. This is the inability to keep urine in your body until you decide to release it. CAUSES  Prostate gland enlargement is a common cause of urinary incontinence. But there are many different causes for losing urinary control. They include:  Medicines.  Infections.  Prostate problems.  Surgery.  Neurological diseases.  Emotional factors. DIAGNOSIS  Evaluating the cause of incontinence is important in choosing the best treatment. This may require:  An ultrasound exam.  Kidney and bladder X-rays.  Cystoscopy. This is an exam of the bladder using a narrow scope. TREATMENT  For incontinent patients, normal daily hygiene and using changing pads or adult diapers regularly will prevent offensive odors and skin damage from the moisture. Changing your medicines may help control incontinence. Your caregiver may prescribe some medicines to help you regain control. Avoid caffeine. It can over-stimulate the bladder. Use the bathroom regularly. Try about every 2 to 3 hours even if you do not feel the need. Take time to empty your bladder completely. After urinating, wait a minute. Then try to urinate again. External devices used to catch urine or an indwelling urine catheter (Foley catheter) may be needed as well. Some prostate gland problems require surgery to correct. Call your caregiver for more information. Document Released: 08/08/2004 Document Revised: 09/23/2011 Document Reviewed: 08/03/2008 [facility] Patient Information 2014 [place], [place].

## 2013-02-21 ENCOUNTER — Encounter (HOSPITAL_BASED_OUTPATIENT_CLINIC_OR_DEPARTMENT_OTHER): Payer: Medicare Other

## 2013-02-22 ENCOUNTER — Encounter: Payer: Self-pay | Admitting: Family Medicine

## 2013-02-22 DIAGNOSIS — Z8679 Personal history of other diseases of the circulatory system: Secondary | ICD-10-CM | POA: Insufficient documentation

## 2013-02-26 ENCOUNTER — Encounter: Payer: Self-pay | Admitting: Family Medicine

## 2013-02-26 DIAGNOSIS — G4733 Obstructive sleep apnea (adult) (pediatric): Secondary | ICD-10-CM | POA: Insufficient documentation

## 2013-03-01 ENCOUNTER — Other Ambulatory Visit: Payer: Self-pay | Admitting: Family Medicine

## 2013-04-21 ENCOUNTER — Ambulatory Visit (INDEPENDENT_AMBULATORY_CARE_PROVIDER_SITE_OTHER): Payer: Medicare Other | Admitting: Family Medicine

## 2013-04-21 ENCOUNTER — Encounter: Payer: Self-pay | Admitting: Family Medicine

## 2013-04-21 VITALS — BP 112/76 | HR 96 | Temp 98.5°F | Wt 236.0 lb

## 2013-04-21 DIAGNOSIS — J069 Acute upper respiratory infection, unspecified: Secondary | ICD-10-CM

## 2013-04-21 DIAGNOSIS — J449 Chronic obstructive pulmonary disease, unspecified: Secondary | ICD-10-CM

## 2013-04-21 DIAGNOSIS — Z23 Encounter for immunization: Secondary | ICD-10-CM

## 2013-04-21 DIAGNOSIS — J441 Chronic obstructive pulmonary disease with (acute) exacerbation: Secondary | ICD-10-CM

## 2013-04-21 MED ORDER — AZITHROMYCIN 250 MG PO TABS: ORAL_TABLET | ORAL | Status: DC

## 2013-04-21 NOTE — Addendum Note (Signed)
Addended by: Deno Etienne on: 04/21/2013 01:29 PM   Modules accepted: Orders, SmartSet

## 2013-04-21 NOTE — Patient Instructions (Signed)
Okay to start an antibiotic this weekend if you're not better or sooner if you or suddenly feeling worse.If  You're not better in one week then please let us know.

## 2013-04-21 NOTE — Progress Notes (Signed)
  Subjective:    Patient ID: Robin Murillo, female    DOB: 1950-10-09, 62 y.o.   MRN: [id number]  HPI 5 days of fatigue, myalgias and cough. No fever, chills or sweats.  No sinus pressure.  + post nasal drip/thick.  Really ST last night  Not tried anything OTC. Let ear hurting some.  No GI sxs.  She was recently diagnosed with sleep apnea as well as COPD. She's been started on Advair and has insulin which she uses for rescue. She has not had an increase in shortness of breath since her symptoms started 5 days ago.   Review of Systems     Objective:   Physical Exam  Constitutional: She is oriented to person, place, and time. She appears well-developed and well-nourished.  HENT:  Head: Normocephalic and atraumatic.  Right Ear: External ear normal.  Left Ear: External ear normal.  Nose: Nose normal.  Mouth/Throat: Oropharynx is clear and moist.  TMs and canals are clear.   Eyes: Conjunctivae and EOM are normal. Pupils are equal, round, and reactive to light.  Neck: Neck supple. No thyromegaly present.  Cardiovascular: Normal rate, regular rhythm and normal heart sounds.   Pulmonary/Chest: Effort normal and breath sounds normal. She has no wheezes.  Lymphadenopathy:    She has no cervical adenopathy.  Neurological: She is alert and oriented to person, place, and time.  Skin: Skin is warm and dry.  Psychiatric: She has a normal mood and affect.          Assessment & Plan:  URI - likely viral but she does have underlying COPD which was recently diagnosed. We'll go ahead and give her prescription for azithromycin to fill the next couple days if she suddenly feels worse or she feels it's starting to affect her lungs. Or if she spikes a fever. Otherwise anchored to wait for this weekend before filling her back if she's not better. Call if not significantly better in one week where she feels that her COPD is exacerbated. She has up-to-date and new inhalers at home.  She also needs a form  completed for [employer]. Requesting that may not turn off her electricity since she does have CPAP which she uses for sleep apnea as well as being able to do more information from her pacemaker for her cardiologist.

## 2013-05-10 ENCOUNTER — Other Ambulatory Visit: Payer: Self-pay | Admitting: Family Medicine

## 2013-07-13 ENCOUNTER — Other Ambulatory Visit: Payer: Self-pay | Admitting: *Deleted

## 2013-07-13 MED ORDER — ALPRAZOLAM 0.5 MG PO TABS: 0.5000 mg | ORAL_TABLET | Freq: Three times a day (TID) | ORAL | Status: DC | PRN

## 2013-07-27 ENCOUNTER — Other Ambulatory Visit: Payer: Self-pay | Admitting: Family Medicine

## 2013-08-18 ENCOUNTER — Other Ambulatory Visit: Payer: Self-pay | Admitting: *Deleted

## 2013-08-18 MED ORDER — FLUOXETINE HCL 40 MG PO CAPS: ORAL_CAPSULE | ORAL | Status: DC

## 2013-08-26 ENCOUNTER — Ambulatory Visit: Payer: Medicare Other | Admitting: Family Medicine

## 2013-09-21 ENCOUNTER — Ambulatory Visit (INDEPENDENT_AMBULATORY_CARE_PROVIDER_SITE_OTHER): Payer: Commercial Managed Care - HMO | Admitting: Family Medicine

## 2013-09-21 ENCOUNTER — Encounter: Payer: Self-pay | Admitting: Family Medicine

## 2013-09-21 VITALS — BP 115/69 | HR 73 | Temp 98.0°F | Ht 64.0 in | Wt 234.0 lb

## 2013-09-21 DIAGNOSIS — J309 Allergic rhinitis, unspecified: Secondary | ICD-10-CM | POA: Insufficient documentation

## 2013-09-21 DIAGNOSIS — R7301 Impaired fasting glucose: Secondary | ICD-10-CM

## 2013-09-21 DIAGNOSIS — Z0000 Encounter for general adult medical examination without abnormal findings: Secondary | ICD-10-CM

## 2013-09-21 DIAGNOSIS — E039 Hypothyroidism, unspecified: Secondary | ICD-10-CM

## 2013-09-21 LAB — COMPLETE METABOLIC PANEL WITH GFR
ALBUMIN: 3.8 g/dL (ref 3.5–5.2)
ALT: 16 U/L (ref 0–35)
AST: 17 U/L (ref 0–37)
Alkaline Phosphatase: 248 U/L — ABNORMAL HIGH (ref 39–117)
BUN: 13 mg/dL (ref 6–23)
CALCIUM: 9.6 mg/dL (ref 8.4–10.5)
CHLORIDE: 104 meq/L (ref 96–112)
CO2: 25 mEq/L (ref 19–32)
Creat: 0.92 mg/dL (ref 0.50–1.10)
GFR, Est African American: 77 mL/min
GFR, Est Non African American: 67 mL/min
Glucose, Bld: 106 mg/dL — ABNORMAL HIGH (ref 70–99)
POTASSIUM: 4.5 meq/L (ref 3.5–5.3)
SODIUM: 136 meq/L (ref 135–145)
TOTAL PROTEIN: 7.4 g/dL (ref 6.0–8.3)
Total Bilirubin: 0.4 mg/dL (ref 0.2–1.2)

## 2013-09-21 LAB — LIPID PANEL
Cholesterol: 174 mg/dL (ref 0–200)
HDL: 45 mg/dL (ref >39)
LDL CALC: 108 mg/dL — AB (ref 0–99)
TRIGLYCERIDES: 105 mg/dL (ref <150)
Total CHOL/HDL Ratio: 3.9 Ratio
VLDL: 21 mg/dL (ref 0–40)

## 2013-09-21 LAB — HEMOGLOBIN A1C
Hgb A1c MFr Bld: 6.1 % — ABNORMAL HIGH (ref <5.7)
MEAN PLASMA GLUCOSE: 128 mg/dL — AB (ref <117)

## 2013-09-21 LAB — TSH: TSH: 1.553 u[IU]/mL (ref 0.350–4.500)

## 2013-09-21 MED ORDER — FLUOXETINE HCL 40 MG PO CAPS: ORAL_CAPSULE | ORAL | Status: DC

## 2013-09-21 MED ORDER — ALPRAZOLAM 0.5 MG PO TABS: 0.5000 mg | ORAL_TABLET | Freq: Three times a day (TID) | ORAL | Status: DC | PRN

## 2013-09-21 MED ORDER — LEVOTHYROXINE SODIUM 125 MCG PO TABS: ORAL_TABLET | ORAL | Status: DC

## 2013-09-21 NOTE — Progress Notes (Signed)
Subjective:    Robin Murillo is a 63 y.o. female who presents for Medicare Annual/Subsequent preventive examination.  Preventive Screening-Counseling & Management  Tobacco History  Smoking status  . Former Smoker  . Quit date: 04/04/2003  Smokeless tobacco  . Not on file     Problems Prior to Visit 1.   Current Problems (verified) Patient Active Problem List   Diagnosis Date Noted  . AR (allergic rhinitis) 09/21/2013  . COPD (chronic obstructive pulmonary disease) 04/21/2013  . Obstructive sleep apnea 02/26/2013  . History of ventricular fibrillation 02/22/2013  . Pacemaker 12/24/2011  . Obesity, Class III, BMI 40-49.9 (morbid obesity) 12/04/2011  . IFG (impaired fasting glucose) 12/04/2011  . Hypothyroid 12/02/2011  . Depression with anxiety 12/02/2011    Medications Prior to Visit Current Outpatient Prescriptions on File Prior to Visit  Medication Sig Dispense Refill  . albuterol (VENTOLIN HFA) 108 (90 BASE) MCG/ACT inhaler Inhale 2 puffs into the lungs every 6 (six) hours as needed for wheezing or shortness of breath.      . AMBULATORY NON FORMULARY MEDICATION Medication Name: Shingles vaccine IM x 1.  1 vial  0  . aspirin 81 MG tablet Take 81 mg by mouth daily.      . Cholecalciferol (VITAMIN D PO) Take 2,000 Int'l Units by mouth daily.       . Fluticasone-Salmeterol (ADVAIR) 100-50 MCG/DOSE AEPB Inhale 1 puff into the lungs every 12 (twelve) hours.      Marland Kitchen levothyroxine (SYNTHROID, LEVOTHROID) 125 MCG tablet TAKE 1 TABLET DAILY.  30 tablet  4  . magnesium oxide (MAG-OX) 400 MG tablet Take 400 mg by mouth 2 (two) times daily.      . metoprolol (LOPRESSOR) 50 MG tablet Take 50 mg by mouth 2 (two) times daily.      . mometasone (NASONEX) 50 MCG/ACT nasal spray Place 2 sprays into the nose daily.  17 g  12   No current facility-administered medications on file prior to visit.    Current Medications (verified) Current Outpatient Prescriptions  Medication Sig  Dispense Refill  . albuterol (VENTOLIN HFA) 108 (90 BASE) MCG/ACT inhaler Inhale 2 puffs into the lungs every 6 (six) hours as needed for wheezing or shortness of breath.      . ALPRAZolam (XANAX) 0.5 MG tablet Take 1 tablet (0.5 mg total) by mouth 3 (three) times daily as needed.  90 tablet  1  . AMBULATORY NON FORMULARY MEDICATION Medication Name: Shingles vaccine IM x 1.  1 vial  0  . aspirin 81 MG tablet Take 81 mg by mouth daily.      . Cholecalciferol (VITAMIN D PO) Take 2,000 Int'l Units by mouth daily.       Marland Kitchen FLUoxetine (PROZAC) 40 MG capsule TAKE 1 CAPSULE DAILY.  90 capsule  2  . Fluticasone-Salmeterol (ADVAIR) 100-50 MCG/DOSE AEPB Inhale 1 puff into the lungs every 12 (twelve) hours.      Marland Kitchen levothyroxine (SYNTHROID, LEVOTHROID) 125 MCG tablet TAKE 1 TABLET DAILY.  30 tablet  4  . magnesium oxide (MAG-OX) 400 MG tablet Take 400 mg by mouth 2 (two) times daily.      . metoprolol (LOPRESSOR) 50 MG tablet Take 50 mg by mouth 2 (two) times daily.      . mometasone (NASONEX) 50 MCG/ACT nasal spray Place 2 sprays into the nose daily.  17 g  12   No current facility-administered medications for this visit.     Allergies (verified) Review of patient's allergies  indicates no known allergies.   PAST HISTORY  Family History Family History  Problem Relation Age of Onset  . Heart attack Brother   . Heart disease Sister     Murmur  . Thyroid disease Sister   . Diabetes Brother     Social History History  Substance Use Topics  . Smoking status: Former Smoker    Quit date: 04/04/2003  . Smokeless tobacco: Not on file  . Alcohol Use: No     Are there smokers in your home (other than you)? Yes, son smokes in her home. She wants him to quit.   Risk Factors Current exercise habits: The patient does not participate in regular exercise at present.  Dietary issues discussed: None   Cardiac risk factors: obesity (BMI >= 30 kg/m2) and sedentary lifestyle.  Depression Screen (Note:  if answer to either of the following is "Yes", a more complete depression screening is indicated)   Over the past two weeks, have you felt down, depressed or hopeless? Yes  Over the past two weeks, have you felt little interest or pleasure in doing things? No  Have you lost interest or pleasure in daily life? No  Do you often feel hopeless? No  Do you cry easily over simple problems? No  Activities of Daily Living In your present state of health, do you have any difficulty performing the following activities?:  Driving? No Managing money?  No Feeding yourself? No Getting from bed to chair? No Climbing a flight of stairs? Yes,  says she gets short of breath climbing stairs because of her COPD. Though well controlled recently. Preparing food and eating?: No Bathing or showering? No Getting dressed: No Getting to the toilet? Yes, has OAB but doesn't want to treat Using the toilet:No Moving around from place to place: No In the past year have you fallen or had a near fall?:Yes   Are you sexually active?  No  Do you have more than one partner?  No  Hearing Difficulties: No Do you often ask people to speak up or repeat themselves? No Do you experience ringing or noises in your ears? No Do you have difficulty understanding soft or whispered voices? No   Do you feel that you have a problem with memory? Yes  Do you often misplace items? No  Do you feel safe at home?  Yes  Cognitive Testing  Alert? Yes  Normal Appearance?Yes  Oriented to person? Yes  Place? Yes   Time? Yes  Recall of three objects?  Yes  Can perform simple calculations? Yes  Displays appropriate judgment?Yes  Can read the correct time from a watch face?Yes   Advanced Directives have been discussed with the patient? No  List the Names of Other Physician/Practitioners you currently use: 1.  Dr. Clovis RileyMitchell 2. Dr. Jacky KindleJaavad   ceived from other than [facility] providers in the past year (date may be  approximate).  Immunization History  Administered Date(s) Administered  . Influenza-Unspecified 04/08/2013  . Pneumococcal Polysaccharide-23 04/21/2013  . Td 05/10/2008  . Tdap 05/10/2008  . Zoster 10/01/2012    Screening Tests Health Maintenance  Topic Date Due  . Colonoscopy  09/25/2013 (Originally 10/23/2000)  . Influenza Vaccine  02/12/2014  . Mammogram  10/02/2014  . Tetanus/tdap  05/10/2018  . Zostavax  Completed    All answers were reviewed with the patient and necessary referrals were made:  Emmalea Treanor, MD   09/21/2013   History reviewed: allergies, current medications, past family history, past medical  history, past social history, past surgical history and problem list  Review of Systems A comprehensive review of systems was negative.    Objective:     Vision by Snellen chart:  Last eye exam two years ago.  Encouraged her to schedule this year.   Body mass index is 40.15 kg/(m^2). BP 115/69  Pulse 73  Temp(Src) 98 F (36.7 C)  Ht 5\' 4"  (1.626 m)  Wt 234 lb (106.142 kg)  BMI 40.15 kg/m2  SpO2 98%  BP 115/69  Pulse 73  Temp(Src) 98 F (36.7 C)  Ht 5\' 4"  (1.626 m)  Wt 234 lb (106.142 kg)  BMI 40.15 kg/m2  SpO2 98% General appearance: alert, cooperative and appears stated age Head: Normocephalic, without obvious abnormality, atraumatic Eyes: conj clear, EOMI, PEERLA Ears: normal TM's and external ear canals both ears Nose: Nares normal. Septum midline. Mucosa normal. No drainage or sinus tenderness. Throat: lips, mucosa, and tongue normal; teeth and gums normal Neck: no adenopathy, no carotid bruit, no JVD, supple, symmetrical, trachea midline and thyroid not enlarged, symmetric, no tenderness/mass/nodules Back: symmetric, no curvature. ROM normal. No CVA tenderness. Lungs: clear to auscultation bilaterally Breasts: normal appearance, no masses or tenderness Heart: regular rate and rhythm, S1, S2 normal, no murmur, click, rub or  gallop Abdomen: soft, non-tender; bowel sounds normal; no masses,  no organomegaly Extremities: extremities normal, atraumatic, no cyanosis or edema Pulses: 2+ and symmetric Skin: Skin color, texture, turgor normal. No rashes or lesions + skin tags around neck and breast tissues.  Lymph nodes: Cervical, supraclavicular, and axillary nodes normal. Neurologic: Alert and oriented X 3, normal strength and tone. Normal symmetric reflexes. Normal coordination and gait     Assessment:     Annual Wellness Exam     Plan:     During the course of the visit the patient was educated and counseled about appropriate screening and preventive services including:    Advanced directives: has NO advanced directive  - add't info requested. Referral to SW: not applicable  Hypothyroid - recheck TSH  Due for CMP and lipids as well  Reminded her to get UTD eye exam this year.   REfills send to pharamcy for thyroid and moods  Depression/anxiety-she feels her meds been down a little bit recently because she actually ran out of her fluoxetine. She was post followup last month but had to reschedule. She would like to restart it and get back on it. She also feels like she gets a little bit more down during the wintertime.  Diet review for nutrition referral? Yes ____  Not Indicated __x_   Patient Instructions (the written plan) was given to the patient.  Medicare Attestation I have personally reviewed: The patient's medical and social history Their use of alcohol, tobacco or illicit drugs Their current medications and supplements The patient's functional ability including ADLs,fall risks, home safety risks, cognitive, and hearing and visual impairment Diet and physical activities Evidence for depression or mood disorders  The patient's weight, height, BMI, and visual acuity have been recorded in the chart.  I have made referrals, counseling, and provided education to the patient based on review of  the above and I have provided the patient with a written personalized care plan for preventive services.     Anniyah Mood, MD   09/21/2013

## 2013-09-21 NOTE — Patient Instructions (Signed)
Keep up a regular exercise program and make sure you are eating a healthy diet Try to eat 4 servings of dairy a day, or if you are lactose intolerant take a calcium with vitamin D daily.  Your vaccines are up to date.   

## 2013-10-13 ENCOUNTER — Other Ambulatory Visit: Payer: Self-pay | Admitting: *Deleted

## 2013-10-13 MED ORDER — FLUOXETINE HCL 40 MG PO CAPS: ORAL_CAPSULE | ORAL | Status: DC

## 2013-10-13 MED ORDER — LEVOTHYROXINE SODIUM 125 MCG PO TABS: ORAL_TABLET | ORAL | Status: DC

## 2013-12-22 ENCOUNTER — Telehealth: Payer: Self-pay

## 2013-12-22 DIAGNOSIS — Z8679 Personal history of other diseases of the circulatory system: Secondary | ICD-10-CM

## 2013-12-22 DIAGNOSIS — Z95810 Presence of automatic (implantable) cardiac defibrillator: Secondary | ICD-10-CM

## 2013-12-22 DIAGNOSIS — J449 Chronic obstructive pulmonary disease, unspecified: Secondary | ICD-10-CM

## 2013-12-22 DIAGNOSIS — G4730 Sleep apnea, unspecified: Secondary | ICD-10-CM

## 2013-12-22 DIAGNOSIS — Z950 Presence of cardiac pacemaker: Secondary | ICD-10-CM

## 2013-12-22 NOTE — Telephone Encounter (Signed)
Robin Murillo, The referrals the patients needs has to be placed BEFORE i can Submit to [facility] for Auth. Thanks

## 2013-12-22 NOTE — Telephone Encounter (Signed)
Referrals placed. Thank you Victorino Dike.

## 2013-12-22 NOTE — Telephone Encounter (Signed)
Nakaila now has [facility]. She will need a referral for each.  [facility] Dr. Sherry Ruffing, MD [address] [place], [place] [address]. She sees him for COPD and Sleep Apnea.  Dr. Si Raider [facility] [address] [place], [place] [address]. She sees him for history of ventricular fibrillation and she has a pacemaker and defibrillator.

## 2014-01-04 DIAGNOSIS — R059 Cough, unspecified: Secondary | ICD-10-CM | POA: Insufficient documentation

## 2014-01-04 DIAGNOSIS — R05 Cough: Secondary | ICD-10-CM | POA: Insufficient documentation

## 2014-03-08 DIAGNOSIS — R9431 Abnormal electrocardiogram [ECG] [EKG]: Secondary | ICD-10-CM | POA: Insufficient documentation

## 2014-04-27 ENCOUNTER — Ambulatory Visit (INDEPENDENT_AMBULATORY_CARE_PROVIDER_SITE_OTHER): Payer: Commercial Managed Care - HMO | Admitting: Family Medicine

## 2014-04-27 ENCOUNTER — Encounter: Payer: Self-pay | Admitting: *Deleted

## 2014-04-27 VITALS — Temp 97.9°F

## 2014-04-27 DIAGNOSIS — Z23 Encounter for immunization: Secondary | ICD-10-CM

## 2014-04-27 NOTE — Progress Notes (Signed)
   Subjective:    Patient ID: Robin Murillo, female    DOB: Mar 22, 1951, 63 y.o.   MRN: [id number] Pt here for flu shot.  Given RD' no complications. Donne AnonAmber Ailee Murillo, CMA HPI    Review of Systems     Objective:   Physical Exam        Assessment & Plan:

## 2014-05-02 ENCOUNTER — Other Ambulatory Visit: Payer: Self-pay | Admitting: Family Medicine

## 2014-05-18 ENCOUNTER — Ambulatory Visit (INDEPENDENT_AMBULATORY_CARE_PROVIDER_SITE_OTHER): Payer: Commercial Managed Care - HMO | Admitting: Family Medicine

## 2014-05-18 ENCOUNTER — Encounter: Payer: Self-pay | Admitting: Family Medicine

## 2014-05-18 VITALS — BP 123/73 | HR 83 | Temp 97.8°F | Ht 64.0 in | Wt 239.0 lb

## 2014-05-18 DIAGNOSIS — R109 Unspecified abdominal pain: Secondary | ICD-10-CM

## 2014-05-18 DIAGNOSIS — L723 Sebaceous cyst: Secondary | ICD-10-CM

## 2014-05-18 DIAGNOSIS — R7301 Impaired fasting glucose: Secondary | ICD-10-CM

## 2014-05-18 LAB — POCT URINALYSIS DIPSTICK
Bilirubin, UA: NEGATIVE
Blood, UA: NEGATIVE
Glucose, UA: NEGATIVE
Ketones, UA: NEGATIVE
LEUKOCYTES UA: NEGATIVE
Nitrite, UA: NEGATIVE
PH UA: 6
PROTEIN UA: NEGATIVE
Spec Grav, UA: 1.025
Urobilinogen, UA: 0.2

## 2014-05-18 LAB — POCT GLYCOSYLATED HEMOGLOBIN (HGB A1C): HEMOGLOBIN A1C: 6.1

## 2014-05-18 NOTE — Progress Notes (Signed)
   Subjective:    Patient ID: Robin Murillo, female    DOB: 1951-05-05, 63 y.o.   MRN: [id number]  HPI She is having left flank pain for almost 2 months. She did feel like it increased in intensity about about 4 days ago. She also started to notice some urinary frequency that started that day as well.  Says feel like a an ache or soreness. Says was in tears over the weekend.  No hematuria.  No fever, chills or sweats.  Some nausea with it.  Hx of kidney stone back in 1970s.  Says would feel a little better if layed down. Never took medicine for it.  No back pain.she denies any abdominal pain or changes with her bowels. She denies any constipation.  She also complains of a lesion on the skin underneath the right breast. She said it tends to get irritated with her breast tissue rubbing it. She said that it had some stuff in it. Her husband squeezed it about 3 weeks ago and got some stuff out but it was very painful. It has been red and irritated since then. No red streaking. No fevers chills or sweats.  Impaired fasting glucose-she no she really needs to work on weight loss. She says her weight keeps going up and down.  Review of Systems     Objective:   Physical Exam  Constitutional: She appears well-developed and well-nourished.  HENT:  Head: Normocephalic and atraumatic.  Abdominal: Soft. Bowel sounds are normal. She exhibits no distension and no mass. There is no tenderness. There is no rebound and no guarding.  Musculoskeletal:  Normal lumbar flexion,m exteions, rotation and right and left.  nontender over the lumbar spine or SI joints. She's a little bit tender just above the hip bone laterally, and a C shape on the low side of the waist.  Skin: Skin is warm and dry.  Psychiatric: She has a normal mood and affect. Her behavior is normal.          Assessment & Plan:  Left flank pain-unclear etiology at this point. Pain doesn't really sound intense enough to be a stone but certainly  could be. She has no hematuria on urinalysis today and she's actually feeling significantly better. Infection was canceled her appointment. The urinalysis is negative for infection but we will send for culture just for confirmation. Consider that the pain can also be coming from her back. She can surly try ibuprofen if her pain increases. Otherwise call us back if she feels like she is getting worse or develops a fever etc.  The lesion under the right breast-most likely a sebaceous cyst. If it starts return was not healing and please let me know and would recommend excision of the lesion itself.  IFG - hemoglobin A1c is stable today at 6.1. Same as previous. Continue work on diet and exercise and repeat in 6 months.

## 2014-08-08 ENCOUNTER — Other Ambulatory Visit: Payer: Self-pay | Admitting: Family Medicine

## 2014-08-08 NOTE — Telephone Encounter (Signed)
F/u appt needed before future refills 

## 2014-09-19 DIAGNOSIS — I472 Ventricular tachycardia: Secondary | ICD-10-CM | POA: Diagnosis not present

## 2014-09-19 DIAGNOSIS — Z95810 Presence of automatic (implantable) cardiac defibrillator: Secondary | ICD-10-CM | POA: Diagnosis not present

## 2014-09-28 ENCOUNTER — Telehealth: Payer: Self-pay | Admitting: Family Medicine

## 2014-09-28 DIAGNOSIS — E559 Vitamin D deficiency, unspecified: Secondary | ICD-10-CM

## 2014-09-28 DIAGNOSIS — Z114 Encounter for screening for human immunodeficiency virus [HIV]: Secondary | ICD-10-CM

## 2014-09-28 DIAGNOSIS — Z13220 Encounter for screening for lipoid disorders: Secondary | ICD-10-CM

## 2014-09-28 DIAGNOSIS — Z0000 Encounter for general adult medical examination without abnormal findings: Secondary | ICD-10-CM

## 2014-09-28 DIAGNOSIS — R7301 Impaired fasting glucose: Secondary | ICD-10-CM

## 2014-09-28 DIAGNOSIS — E039 Hypothyroidism, unspecified: Secondary | ICD-10-CM

## 2014-09-28 DIAGNOSIS — Z1231 Encounter for screening mammogram for malignant neoplasm of breast: Secondary | ICD-10-CM

## 2014-09-28 DIAGNOSIS — Z1159 Encounter for screening for other viral diseases: Secondary | ICD-10-CM

## 2014-09-29 ENCOUNTER — Other Ambulatory Visit: Payer: Self-pay | Admitting: Family Medicine

## 2014-09-29 DIAGNOSIS — E039 Hypothyroidism, unspecified: Secondary | ICD-10-CM | POA: Diagnosis not present

## 2014-09-29 DIAGNOSIS — D649 Anemia, unspecified: Secondary | ICD-10-CM | POA: Diagnosis not present

## 2014-09-29 DIAGNOSIS — Z114 Encounter for screening for human immunodeficiency virus [HIV]: Secondary | ICD-10-CM | POA: Diagnosis not present

## 2014-09-29 DIAGNOSIS — Z13220 Encounter for screening for lipoid disorders: Secondary | ICD-10-CM | POA: Diagnosis not present

## 2014-09-29 DIAGNOSIS — Z1159 Encounter for screening for other viral diseases: Secondary | ICD-10-CM | POA: Diagnosis not present

## 2014-09-29 DIAGNOSIS — R7301 Impaired fasting glucose: Secondary | ICD-10-CM | POA: Diagnosis not present

## 2014-09-29 DIAGNOSIS — E559 Vitamin D deficiency, unspecified: Secondary | ICD-10-CM | POA: Diagnosis not present

## 2014-09-29 DIAGNOSIS — Z0000 Encounter for general adult medical examination without abnormal findings: Secondary | ICD-10-CM | POA: Diagnosis not present

## 2014-09-29 NOTE — Telephone Encounter (Signed)
Patient called advised that she called you 2 wks ago with a question and u never called her back. I adv her if you did not answer to leave a message you are seeing patients. Thanks

## 2014-09-29 NOTE — Telephone Encounter (Signed)
Pt called and stated that she will need a 90 day supply for her medications. I spoke w/ her son and advised him that she would need to have a f/u appt she will need labs drawn. I will send the refills for her medications and advised that she WILL need to f/u because we cannot continue to fill her meds without her being seen. He voiced understanding lab orders faxed.Loralee PacasBarkley, Elysha Daw EchelonLynetta

## 2014-09-30 LAB — TSH: TSH: 1.569 u[IU]/mL (ref 0.350–4.500)

## 2014-09-30 LAB — COMPLETE METABOLIC PANEL WITH GFR
ALT: 18 U/L (ref 0–35)
AST: 17 U/L (ref 0–37)
Albumin: 3.6 g/dL (ref 3.5–5.2)
Alkaline Phosphatase: 354 U/L — ABNORMAL HIGH (ref 39–117)
BILIRUBIN TOTAL: 0.4 mg/dL (ref 0.2–1.2)
BUN: 10 mg/dL (ref 6–23)
CO2: 25 mEq/L (ref 19–32)
Calcium: 9.1 mg/dL (ref 8.4–10.5)
Chloride: 106 mEq/L (ref 96–112)
Creat: 0.76 mg/dL (ref 0.50–1.10)
GFR, Est Non African American: 84 mL/min
Glucose, Bld: 101 mg/dL — ABNORMAL HIGH (ref 70–99)
Potassium: 4.9 mEq/L (ref 3.5–5.3)
Sodium: 141 mEq/L (ref 135–145)
Total Protein: 7.2 g/dL (ref 6.0–8.3)

## 2014-09-30 LAB — LIPID PANEL
Cholesterol: 143 mg/dL (ref 0–200)
HDL: 45 mg/dL — ABNORMAL LOW (ref >=46)
LDL Cholesterol: 88 mg/dL (ref 0–99)
TRIGLYCERIDES: 52 mg/dL (ref <150)
Total CHOL/HDL Ratio: 3.2 Ratio
VLDL: 10 mg/dL (ref 0–40)

## 2014-10-01 LAB — HEMOGLOBIN A1C
Hgb A1c MFr Bld: 6.3 % — ABNORMAL HIGH (ref <5.7)
MEAN PLASMA GLUCOSE: 134 mg/dL — AB (ref <117)

## 2014-10-01 LAB — HIV ANTIBODY (ROUTINE TESTING W REFLEX): HIV 1&2 Ab, 4th Generation: NONREACTIVE

## 2014-10-01 LAB — HEPATITIS C ANTIBODY: HCV Ab: NEGATIVE

## 2014-10-01 LAB — VITAMIN D 25 HYDROXY (VIT D DEFICIENCY, FRACTURES): Vit D, 25-Hydroxy: 28 ng/mL — ABNORMAL LOW (ref 30–100)

## 2014-10-05 ENCOUNTER — Other Ambulatory Visit: Payer: Self-pay | Admitting: *Deleted

## 2014-10-05 DIAGNOSIS — E559 Vitamin D deficiency, unspecified: Secondary | ICD-10-CM

## 2014-10-06 LAB — FERRITIN: Ferritin: 165 ng/mL (ref 10–291)

## 2014-10-06 LAB — GAMMA GT: GGT: 31 U/L (ref 7–51)

## 2014-10-12 ENCOUNTER — Ambulatory Visit (INDEPENDENT_AMBULATORY_CARE_PROVIDER_SITE_OTHER): Payer: Commercial Managed Care - HMO | Admitting: Family Medicine

## 2014-10-12 ENCOUNTER — Encounter: Payer: Self-pay | Admitting: Family Medicine

## 2014-10-12 VITALS — BP 124/73 | HR 94 | Ht 64.0 in | Wt 241.0 lb

## 2014-10-12 DIAGNOSIS — K219 Gastro-esophageal reflux disease without esophagitis: Secondary | ICD-10-CM | POA: Insufficient documentation

## 2014-10-12 DIAGNOSIS — Z1231 Encounter for screening mammogram for malignant neoplasm of breast: Secondary | ICD-10-CM

## 2014-10-12 DIAGNOSIS — Z0000 Encounter for general adult medical examination without abnormal findings: Secondary | ICD-10-CM | POA: Diagnosis not present

## 2014-10-12 DIAGNOSIS — K2100 Gastro-esophageal reflux disease with esophagitis, without bleeding: Secondary | ICD-10-CM

## 2014-10-12 DIAGNOSIS — E559 Vitamin D deficiency, unspecified: Secondary | ICD-10-CM | POA: Diagnosis not present

## 2014-10-12 MED ORDER — ALPRAZOLAM 0.5 MG PO TABS: 0.5000 mg | ORAL_TABLET | Freq: Three times a day (TID) | ORAL | Status: DC | PRN

## 2014-10-12 NOTE — Progress Notes (Signed)
Subjective:    Robin Murillo is a 64 y.o. female who presents for Medicare Annual/Subsequent preventive examination.  Preventive Screening-Counseling & Management  Tobacco History  Smoking status  . Former Smoker  . Quit date: 04/04/2003  Smokeless tobacco  . Not on file     Problems Prior to Visit 1.   Current Problems (verified) Patient Active Problem List   Diagnosis Date Noted  . AR (allergic rhinitis) 09/21/2013  . COPD (chronic obstructive pulmonary disease) 04/21/2013  . Obstructive sleep apnea 02/26/2013  . History of ventricular fibrillation 02/22/2013  . Pacemaker 12/24/2011  . Obesity, Class III, BMI 40-49.9 (morbid obesity) 12/04/2011  . IFG (impaired fasting glucose) 12/04/2011  . Hypothyroid 12/02/2011  . Depression with anxiety 12/02/2011    Medications Prior to Visit Current Outpatient Prescriptions on File Prior to Visit  Medication Sig Dispense Refill  . albuterol (VENTOLIN HFA) 108 (90 BASE) MCG/ACT inhaler Inhale 2 puffs into the lungs every 6 (six) hours as needed for wheezing or shortness of breath.    . ALPRAZolam (XANAX) 0.5 MG tablet Take 1 tablet (0.5 mg total) by mouth 3 (three) times daily as needed. 90 tablet 1  . aspirin 81 MG tablet Take 81 mg by mouth daily.    . Cholecalciferol (VITAMIN D PO) Take 2,000 Int'l Units by mouth daily.     Marland Kitchen FLUoxetine (PROZAC) 40 MG capsule TAKE 1 CAPSULE EVERY DAY 90 capsule 0  . Fluticasone-Salmeterol (ADVAIR) 100-50 MCG/DOSE AEPB Inhale 1 puff into the lungs every 12 (twelve) hours.    Marland Kitchen levothyroxine (SYNTHROID, LEVOTHROID) 125 MCG tablet TAKE 1 TABLET DAILY. 90 tablet 0  . magnesium oxide (MAG-OX) 400 MG tablet Take 400 mg by mouth 2 (two) times daily.    . metoprolol (LOPRESSOR) 50 MG tablet Take 50 mg by mouth 2 (two) times daily.     No current facility-administered medications on file prior to visit.    Current Medications (verified) Current Outpatient Prescriptions  Medication Sig Dispense  Refill  . albuterol (VENTOLIN HFA) 108 (90 BASE) MCG/ACT inhaler Inhale 2 puffs into the lungs every 6 (six) hours as needed for wheezing or shortness of breath.    . ALPRAZolam (XANAX) 0.5 MG tablet Take 1 tablet (0.5 mg total) by mouth 3 (three) times daily as needed. 90 tablet 1  . aspirin 81 MG tablet Take 81 mg by mouth daily.    . Cholecalciferol (VITAMIN D PO) Take 2,000 Int'l Units by mouth daily.     Marland Kitchen FLUoxetine (PROZAC) 40 MG capsule TAKE 1 CAPSULE EVERY DAY 90 capsule 0  . Fluticasone-Salmeterol (ADVAIR) 100-50 MCG/DOSE AEPB Inhale 1 puff into the lungs every 12 (twelve) hours.    Marland Kitchen levothyroxine (SYNTHROID, LEVOTHROID) 125 MCG tablet TAKE 1 TABLET DAILY. 90 tablet 0  . magnesium oxide (MAG-OX) 400 MG tablet Take 400 mg by mouth 2 (two) times daily.    . metoprolol (LOPRESSOR) 50 MG tablet Take 50 mg by mouth 2 (two) times daily.     No current facility-administered medications for this visit.     Allergies (verified) Morphine and related   PAST HISTORY  Family History Family History  Problem Relation Age of Onset  . Heart attack Brother   . Heart disease Sister     Murmur  . Thyroid disease Sister   . Diabetes Brother     Social History History  Substance Use Topics  . Smoking status: Former Smoker    Quit date: 04/04/2003  . Smokeless tobacco: Not  on file  . Alcohol Use: No     Are there smokers in your home (other than you)? Yes  Risk Factors Current exercise habits: The patient does not participate in regular exercise at present.  Dietary issues discussed: None   Cardiac risk factors: obesity (BMI >= 30 kg/m2) and sedentary lifestyle.  Depression Screen (Note: if answer to either of the following is "Yes", a more complete depression screening is indicated)   Over the past two weeks, have you felt down, depressed or hopeless? No  Over the past two weeks, have you felt little interest or pleasure in doing things? No  Have you lost interest or pleasure  in daily life? No  Do you often feel hopeless? No  Do you cry easily over simple problems? Yes  Activities of Daily Living In your present state of health, do you have any difficulty performing the following activities?:  Driving? No Managing money?  No Feeding yourself? No Getting from bed to chair? No  Climbing a flight of stairs? Yes Preparing food and eating?: No Bathing or showering? Yes, gets SOB Getting dressed: No Getting to the toilet? No Using the toilet:No Moving around from place to place: No In the past year have you fallen or had a near fall?:Yes   Are you sexually active?  No  Do you have more than one partner?  No  Hearing Difficulties: No Do you often ask people to speak up or repeat themselves? No Do you experience ringing or noises in your ears? Yes Do you have difficulty understanding soft or whispered voices? No   Do you feel that you have a problem with memory? No  Do you often misplace items? No  Do you feel safe at home?  Yes  Cognitive Testing  Alert? Yes  Normal Appearance?Yes  Oriented to person? Yes  Place? Yes   Time? Yes  Recall of three objects?  Yes  Can perform simple calculations? Yes  Displays appropriate judgment?Yes  Can read the correct time from a watch face?Yes   6 CIt core of 0/28 today, normal    Advanced Directives have been discussed with the patient? Yes  List the Names of Other Physician/Practitioners you currently use: 1.  Dr. Jerre Simon 2. Dr. Emmit Pomfret any recent Medical Services you may have received from other than [facility] providers in the past year (date may be approximate).  Immunization History  Administered Date(s) Administered  . Influenza,inj,Quad PF,36+ Mos 04/27/2014  . Influenza-Unspecified 04/08/2013  . Pneumococcal Polysaccharide-23 04/21/2013  . Td 05/10/2008  . Tdap 05/10/2008  . Zoster 10/01/2012    Screening Tests Health Maintenance  Topic Date Due  . COLONOSCOPY  10/23/2000  .  MAMMOGRAM  10/02/2014  . INFLUENZA VACCINE  02/13/2015  . TETANUS/TDAP  05/10/2018  . ZOSTAVAX  Completed  . Hepatitis C Screening  Completed  . HIV Screening  Completed    All answers were reviewed with the patient and necessary referrals were made:  Robin Thede, MD   10/12/2014   History reviewed: allergies, current medications, past family history, past medical history, past social history, past surgical history and problem list  Review of Systems A comprehensive review of systems was negative.    Objective:     Vision by Snellen chart: right eye:20/25, left eye:20/25  Body mass index is 41.35 kg/(m^2). BP 124/73 mmHg  Pulse 94  Ht  (1.626 m)  Wt 241 lb (109.317 kg)  BMI 41.35 kg/m2  BP 124/73 mmHg  Pulse 94  Ht 5\' 4"  (1.626 m)  Wt 241 lb (109.317 kg)  BMI 41.35 kg/m2 General appearance: alert, cooperative and appears stated age Head: Normocephalic, without obvious abnormality, atraumatic Eyes: conj clear, EOMi, PEERLA Ears: normal TM's and external ear canals both ears Nose: Nares normal. Septum midline. Mucosa normal. No drainage or sinus tenderness. Throat: lips, mucosa, and tongue normal; teeth and gums normal Neck: no adenopathy, no carotid bruit, no JVD, supple, symmetrical, trachea midline and thyroid not enlarged, symmetric, no tenderness/mass/nodules Back: symmetric, no curvature. ROM normal. No CVA tenderness. Lungs: clear to auscultation bilaterally Breasts: normal appearance, no masses or tenderness Heart: regular rate and rhythm, S1, S2 normal, no murmur, click, rub or gallop Abdomen: soft, non-tender; bowel sounds normal; no masses,  no organomegaly Pelvic: deferred Extremities: extremities normal, atraumatic, no cyanosis or edema Pulses: 2+ and symmetric Skin: Skin color, texture, turgor normal. No rashes or lesions Lymph nodes: Cervical, supraclavicular, and axillary nodes normal. Neurologic: Alert and oriented X 3, normal strength and  tone. Normal symmetric reflexes. Normal coordination and gait     Assessment:     Medicare Annual Wellness Exam       Plan:     During the course of the visit the patient was educated and counseled about appropriate screening and preventive services including:    Screening mammography  Colorectal cancer screening   Last eye exam was 2 years ago.   GERD - discussed risk of long term PPI use. She has already dec her PPI every other day.   Work on adequate calcium and vitamin D.   Diet review for nutrition referral? Yes ____  Not Indicated _X__   Patient Instructions (the written plan) was given to the patient.  Medicare Attestation I have personally reviewed: The patient's medical and social history Their use of alcohol, tobacco or illicit drugs Their current medications and supplements The patient's functional ability including ADLs,fall risks, home safety risks, cognitive, and hearing and visual impairment Diet and physical activities Evidence for depression or mood disorders  The patient's weight, height, BMI, and visual acuity have been recorded in the chart.  I have made referrals, counseling, and provided education to the patient based on review of the above and I have provided the patient with a written personalized care plan for preventive services.     Robin Forrest, MD   10/12/2014

## 2014-10-12 NOTE — Addendum Note (Signed)
Addended by: Deno EtienneBARKLEY, Pa Tennant L on: 10/12/2014 04:45 PM   Modules accepted: Orders

## 2014-10-12 NOTE — Patient Instructions (Signed)
Keep up a regular exercise program and make sure you are eating a healthy diet Try to eat 4 servings of dairy a day, or if you are lactose intolerant take a calcium with vitamin D daily.  Your vaccines are up to date.    Food Choices for Gastroesophageal Reflux Disease When you have gastroesophageal reflux disease (GERD), the foods you eat and your eating habits are very important. Choosing the right foods can help ease the discomfort of GERD. WHAT GENERAL GUIDELINES DO I NEED TO FOLLOW?  Choose fruits, vegetables, whole grains, low-fat dairy products, and low-fat meat, fish, and poultry.  Limit fats such as oils, salad dressings, butter, nuts, and avocado.  Keep a food diary to identify foods that cause symptoms.  Avoid foods that cause reflux. These may be different for different people.  Eat frequent small meals instead of three large meals each day.  Eat your meals slowly, in a relaxed setting.  Limit fried foods.  Cook foods using methods other than frying.  Avoid drinking alcohol.  Avoid drinking large amounts of liquids with your meals.  Avoid bending over or lying down until 2-3 hours after eating. WHAT FOODS ARE NOT RECOMMENDED? The following are some foods and drinks that may worsen your symptoms: Vegetables Tomatoes. Tomato juice. Tomato and spaghetti sauce. Chili peppers. Onion and garlic. Horseradish. Fruits Oranges, grapefruit, and lemon (fruit and juice). Meats High-fat meats, fish, and poultry. This includes hot dogs, ribs, ham, sausage, salami, and bacon. Dairy Whole milk and chocolate milk. Sour cream. Cream. Butter. Ice cream. Cream cheese.  Beverages Coffee and tea, with or without caffeine. Carbonated beverages or energy drinks. Condiments Hot sauce. Barbecue sauce.  Sweets/Desserts Chocolate and cocoa. Donuts. Peppermint and spearmint. Fats and Oils High-fat foods, including [place] fries and potato chips. Other Vinegar. Strong spices, such as  black pepper, white pepper, red pepper, cayenne, curry powder, cloves, ginger, and chili powder. The items listed above may not be a complete list of foods and beverages to avoid. Contact your dietitian for more information. Document Released: 07/01/2005 Document Revised: 07/06/2013 Document Reviewed: 05/05/2013 [facility] Patient Information 2015 [place] [place]. This information is not intended to replace advice given to you by your health care provider. Make sure you discuss any questions you have with your health care provider.

## 2014-10-13 LAB — PTH, INTACT AND CALCIUM
CALCIUM: 9.3 mg/dL (ref 8.4–10.5)
PTH: 119 pg/mL — AB (ref 14–64)

## 2014-10-14 ENCOUNTER — Other Ambulatory Visit: Payer: Self-pay | Admitting: Family Medicine

## 2014-10-14 DIAGNOSIS — R748 Abnormal levels of other serum enzymes: Secondary | ICD-10-CM

## 2014-10-14 DIAGNOSIS — E349 Endocrine disorder, unspecified: Secondary | ICD-10-CM

## 2014-10-20 ENCOUNTER — Ambulatory Visit (INDEPENDENT_AMBULATORY_CARE_PROVIDER_SITE_OTHER): Payer: Commercial Managed Care - HMO

## 2014-10-20 DIAGNOSIS — Z1231 Encounter for screening mammogram for malignant neoplasm of breast: Secondary | ICD-10-CM | POA: Diagnosis not present

## 2014-10-31 DIAGNOSIS — E559 Vitamin D deficiency, unspecified: Secondary | ICD-10-CM | POA: Diagnosis not present

## 2014-10-31 DIAGNOSIS — E039 Hypothyroidism, unspecified: Secondary | ICD-10-CM | POA: Diagnosis not present

## 2014-10-31 DIAGNOSIS — N2581 Secondary hyperparathyroidism of renal origin: Secondary | ICD-10-CM | POA: Diagnosis not present

## 2014-10-31 DIAGNOSIS — R7309 Other abnormal glucose: Secondary | ICD-10-CM | POA: Diagnosis not present

## 2014-10-31 DIAGNOSIS — R748 Abnormal levels of other serum enzymes: Secondary | ICD-10-CM | POA: Diagnosis not present

## 2014-10-31 DIAGNOSIS — M25552 Pain in left hip: Secondary | ICD-10-CM | POA: Diagnosis not present

## 2014-11-02 DIAGNOSIS — R748 Abnormal levels of other serum enzymes: Secondary | ICD-10-CM | POA: Diagnosis not present

## 2014-11-02 DIAGNOSIS — E559 Vitamin D deficiency, unspecified: Secondary | ICD-10-CM | POA: Diagnosis not present

## 2014-11-02 DIAGNOSIS — N2581 Secondary hyperparathyroidism of renal origin: Secondary | ICD-10-CM | POA: Diagnosis not present

## 2014-11-18 DIAGNOSIS — R748 Abnormal levels of other serum enzymes: Secondary | ICD-10-CM | POA: Diagnosis not present

## 2014-11-18 DIAGNOSIS — N2581 Secondary hyperparathyroidism of renal origin: Secondary | ICD-10-CM | POA: Diagnosis not present

## 2014-11-18 DIAGNOSIS — E559 Vitamin D deficiency, unspecified: Secondary | ICD-10-CM | POA: Diagnosis not present

## 2014-11-21 DIAGNOSIS — Z0100 Encounter for examination of eyes and vision without abnormal findings: Secondary | ICD-10-CM | POA: Diagnosis not present

## 2014-11-21 LAB — HM DIABETES EYE EXAM

## 2014-11-23 DIAGNOSIS — G4733 Obstructive sleep apnea (adult) (pediatric): Secondary | ICD-10-CM | POA: Diagnosis not present

## 2014-11-24 DIAGNOSIS — J449 Chronic obstructive pulmonary disease, unspecified: Secondary | ICD-10-CM | POA: Diagnosis not present

## 2014-11-24 DIAGNOSIS — G4733 Obstructive sleep apnea (adult) (pediatric): Secondary | ICD-10-CM | POA: Diagnosis not present

## 2014-11-24 DIAGNOSIS — R0602 Shortness of breath: Secondary | ICD-10-CM | POA: Diagnosis not present

## 2014-12-05 DIAGNOSIS — G4733 Obstructive sleep apnea (adult) (pediatric): Secondary | ICD-10-CM | POA: Diagnosis not present

## 2014-12-06 DIAGNOSIS — H5213 Myopia, bilateral: Secondary | ICD-10-CM | POA: Diagnosis not present

## 2014-12-06 DIAGNOSIS — H52209 Unspecified astigmatism, unspecified eye: Secondary | ICD-10-CM | POA: Diagnosis not present

## 2014-12-06 DIAGNOSIS — Z0100 Encounter for examination of eyes and vision without abnormal findings: Secondary | ICD-10-CM | POA: Diagnosis not present

## 2014-12-06 DIAGNOSIS — H524 Presbyopia: Secondary | ICD-10-CM | POA: Diagnosis not present

## 2014-12-22 ENCOUNTER — Other Ambulatory Visit: Payer: Self-pay | Admitting: *Deleted

## 2014-12-22 DIAGNOSIS — Z950 Presence of cardiac pacemaker: Secondary | ICD-10-CM

## 2014-12-22 DIAGNOSIS — Z95810 Presence of automatic (implantable) cardiac defibrillator: Secondary | ICD-10-CM

## 2014-12-22 DIAGNOSIS — E559 Vitamin D deficiency, unspecified: Secondary | ICD-10-CM | POA: Diagnosis not present

## 2014-12-22 DIAGNOSIS — G4730 Sleep apnea, unspecified: Secondary | ICD-10-CM

## 2014-12-22 DIAGNOSIS — N2581 Secondary hyperparathyroidism of renal origin: Secondary | ICD-10-CM | POA: Diagnosis not present

## 2014-12-22 DIAGNOSIS — I4901 Ventricular fibrillation: Secondary | ICD-10-CM

## 2014-12-22 DIAGNOSIS — R748 Abnormal levels of other serum enzymes: Secondary | ICD-10-CM | POA: Diagnosis not present

## 2014-12-22 DIAGNOSIS — R82994 Hypercalciuria: Secondary | ICD-10-CM

## 2014-12-22 DIAGNOSIS — J449 Chronic obstructive pulmonary disease, unspecified: Secondary | ICD-10-CM

## 2014-12-22 DIAGNOSIS — M880 Osteitis deformans of skull: Secondary | ICD-10-CM | POA: Diagnosis not present

## 2014-12-22 MED ORDER — FLUOXETINE HCL 40 MG PO CAPS: 40.0000 mg | ORAL_CAPSULE | Freq: Every day | ORAL | Status: DC

## 2014-12-22 MED ORDER — LEVOTHYROXINE SODIUM 125 MCG PO TABS: 125.0000 ug | ORAL_TABLET | Freq: Every day | ORAL | Status: DC

## 2014-12-26 DIAGNOSIS — I472 Ventricular tachycardia: Secondary | ICD-10-CM | POA: Diagnosis not present

## 2014-12-26 DIAGNOSIS — Z95810 Presence of automatic (implantable) cardiac defibrillator: Secondary | ICD-10-CM | POA: Diagnosis not present

## 2014-12-27 DIAGNOSIS — Z87891 Personal history of nicotine dependence: Secondary | ICD-10-CM | POA: Diagnosis not present

## 2014-12-27 DIAGNOSIS — K219 Gastro-esophageal reflux disease without esophagitis: Secondary | ICD-10-CM | POA: Diagnosis not present

## 2014-12-27 DIAGNOSIS — Z13820 Encounter for screening for osteoporosis: Secondary | ICD-10-CM | POA: Diagnosis not present

## 2014-12-27 DIAGNOSIS — M889 Osteitis deformans of unspecified bone: Secondary | ICD-10-CM | POA: Diagnosis not present

## 2014-12-27 DIAGNOSIS — Z79899 Other long term (current) drug therapy: Secondary | ICD-10-CM | POA: Diagnosis not present

## 2014-12-27 LAB — HM DEXA SCAN

## 2015-01-10 DIAGNOSIS — I472 Ventricular tachycardia: Secondary | ICD-10-CM | POA: Diagnosis not present

## 2015-01-10 DIAGNOSIS — I4901 Ventricular fibrillation: Secondary | ICD-10-CM | POA: Diagnosis not present

## 2015-01-10 DIAGNOSIS — Z95810 Presence of automatic (implantable) cardiac defibrillator: Secondary | ICD-10-CM | POA: Diagnosis not present

## 2015-01-10 DIAGNOSIS — R9431 Abnormal electrocardiogram [ECG] [EKG]: Secondary | ICD-10-CM | POA: Diagnosis not present

## 2015-01-17 ENCOUNTER — Encounter: Payer: Self-pay | Admitting: Family Medicine

## 2015-01-17 DIAGNOSIS — Z95810 Presence of automatic (implantable) cardiac defibrillator: Secondary | ICD-10-CM | POA: Insufficient documentation

## 2015-02-02 DIAGNOSIS — R748 Abnormal levels of other serum enzymes: Secondary | ICD-10-CM | POA: Diagnosis not present

## 2015-02-02 DIAGNOSIS — E559 Vitamin D deficiency, unspecified: Secondary | ICD-10-CM | POA: Diagnosis not present

## 2015-02-02 DIAGNOSIS — N2581 Secondary hyperparathyroidism of renal origin: Secondary | ICD-10-CM | POA: Diagnosis not present

## 2015-02-06 DIAGNOSIS — M880 Osteitis deformans of skull: Secondary | ICD-10-CM | POA: Diagnosis not present

## 2015-02-06 DIAGNOSIS — E559 Vitamin D deficiency, unspecified: Secondary | ICD-10-CM | POA: Diagnosis not present

## 2015-02-06 DIAGNOSIS — E039 Hypothyroidism, unspecified: Secondary | ICD-10-CM | POA: Diagnosis not present

## 2015-02-07 ENCOUNTER — Encounter: Payer: Self-pay | Admitting: Family Medicine

## 2015-02-07 DIAGNOSIS — M880 Osteitis deformans of skull: Secondary | ICD-10-CM | POA: Insufficient documentation

## 2015-03-27 DIAGNOSIS — I472 Ventricular tachycardia: Secondary | ICD-10-CM | POA: Diagnosis not present

## 2015-03-27 DIAGNOSIS — Z95810 Presence of automatic (implantable) cardiac defibrillator: Secondary | ICD-10-CM | POA: Diagnosis not present

## 2015-04-14 ENCOUNTER — Ambulatory Visit: Payer: Commercial Managed Care - HMO | Admitting: Family Medicine

## 2015-05-03 DIAGNOSIS — G4733 Obstructive sleep apnea (adult) (pediatric): Secondary | ICD-10-CM | POA: Diagnosis not present

## 2015-05-05 ENCOUNTER — Encounter: Payer: Self-pay | Admitting: Family Medicine

## 2015-05-05 ENCOUNTER — Ambulatory Visit (INDEPENDENT_AMBULATORY_CARE_PROVIDER_SITE_OTHER): Payer: Commercial Managed Care - HMO | Admitting: Family Medicine

## 2015-05-05 VITALS — BP 110/92 | HR 83 | Ht 64.0 in | Wt 242.0 lb

## 2015-05-05 DIAGNOSIS — Z6841 Body Mass Index (BMI) 40.0 and over, adult: Secondary | ICD-10-CM

## 2015-05-05 DIAGNOSIS — R7301 Impaired fasting glucose: Secondary | ICD-10-CM

## 2015-05-05 DIAGNOSIS — F418 Other specified anxiety disorders: Secondary | ICD-10-CM | POA: Diagnosis not present

## 2015-05-05 DIAGNOSIS — E559 Vitamin D deficiency, unspecified: Secondary | ICD-10-CM | POA: Diagnosis not present

## 2015-05-05 DIAGNOSIS — Z23 Encounter for immunization: Secondary | ICD-10-CM

## 2015-05-05 LAB — POCT GLYCOSYLATED HEMOGLOBIN (HGB A1C): HEMOGLOBIN A1C: 6

## 2015-05-05 NOTE — Progress Notes (Signed)
Subjective:    Patient ID: Robin Murillo, female    DOB: 09/16/1950, 64 y.o.   MRN: [id number]  HPI IFG - Not doing well with her diet.  She has gained about 2 lbs. She hasn't been exercising.  No increased thirst or urination.  Depression/Anxiety -she is fluoxetine 40mg . She says sometimes she gets irritable esp with her husband.  She occ uses a xanax.  She reports feeling down or depressed several days of the week and feeling like she has low energy more than half of the days. She also reports low self-esteem. She reports feeling more nervous and on edge more than half the days and feeling like she worries too much. She also feels like she has increased irritability almost every day.  obesity- Not doing well with her diet.  She has gained about 2 lbs. She hasn't been exercising.    Vitamin D deficiency-due to recheck levels. Vitamin D level was low at 28 in March.she has been taking a supplement.   Review of Systems      BP 110/92 mmHg  Pulse 83  Ht 5\' 4"  (1.626 m)  Wt 242 lb (109.77 kg)  BMI 41.52 kg/m2    Allergies  Allergen Reactions  . Morphine And Related Swelling    Swelling in face    Past Medical History  Diagnosis Date  . Heart disease     pacemaker/defiberlator  . Thyroid disease     Past Surgical History  Procedure Laterality Date  . Pacemaker placement      [facility]  . Cardiac [facility]illator [facility]      [facility]  . Cardiac electrophysiology study and ablation      06/2010    Social History   Social History  . Marital Status: Married    Spouse Name: Maurine MinisterDennis  . Number of Children: 3  . Years of Education: GED   Occupational History  . disability    Social History Main Topics  . Smoking status: Former Smoker    Quit date: 04/04/2003  . Smokeless tobacco: Not on file  . Alcohol Use: No  . Drug Use: No  . Sexual Activity: Not on file   Other Topics Concern  . Not on file   Social History Narrative   1  caffeine dirink per day. No regular exercise.     Family History  Problem Relation Age of Onset  . Heart attack Brother   . Heart disease Sister     Murmur  . Thyroid disease Sister   . Diabetes Brother     Outpatient Encounter Prescriptions as of 05/05/2015  Medication Sig  . albuterol (VENTOLIN HFA) 108 (90 BASE) MCG/ACT inhaler Inhale 2 puffs into the lungs every 6 (six) hours as needed for wheezing or shortness of breath.  . ALPRAZolam (XANAX) 0.5 MG tablet Take 1 tablet (0.5 mg total) by mouth 3 (three) times daily as needed.  Marland Kitchen. aspirin 81 MG tablet Take 81 mg by mouth daily.  . Cholecalciferol (VITAMIN D PO) Take 2,000 Int'l Units by mouth daily.   Marland Kitchen. FLUoxetine (PROZAC) 40 MG capsule Take 1 capsule (40 mg total) by mouth daily.  . Fluticasone-Salmeterol (ADVAIR) 100-50 MCG/DOSE AEPB Inhale 1 puff into the lungs every 12 (twelve) hours.  Marland Kitchen. levothyroxine (SYNTHROID, LEVOTHROID) 125 MCG tablet Take 1 tablet (125 mcg total) by mouth daily.  . magnesium oxide (MAG-OX) 400 MG tablet Take 400 mg by mouth 2 (two) times daily.  .Marland Kitchen  metoprolol (LOPRESSOR) 50 MG tablet Take 50 mg by mouth 2 (two) times daily.   No facility-administered encounter medications on file as of 05/05/2015.       Objective:   Physical Exam  Constitutional: She is oriented to person, place, and time. She appears well-developed and well-nourished.  HENT:  Head: Normocephalic and atraumatic.  Cardiovascular: Normal rate, regular rhythm and normal heart sounds.   Pulmonary/Chest: Effort normal and breath sounds normal.  Neurological: She is alert and oriented to person, place, and time.  Skin: Skin is warm and dry.  Psychiatric: She has a normal mood and affect. Her behavior is normal.          Assessment & Plan:  IFG - well controlled at 6.0. Stable. Recheck in 6 months.   Depression /anxiety - not well controlled.   PHQ 9 score of  11    An gad 7 score of  11 .  She rates her sxs as somewhat  difficulty.  Offered to increase her fluoxetine today. She seemed very hesistant to do that.  Encouraged her to call me at any point if she changes her mind and decide she is okay with increasing the dose.   Discussed need for colon cancer screening. Declines colonsocpy but willing to do the cologuard.   BMI /BMI 41- strongly encourage her to get back on track with diet and exercise.   Vitamin D def - will recheck levels.

## 2015-05-06 LAB — VITAMIN D 25 HYDROXY (VIT D DEFICIENCY, FRACTURES): VIT D 25 HYDROXY: 32 ng/mL (ref 30–100)

## 2015-06-26 DIAGNOSIS — Z95810 Presence of automatic (implantable) cardiac defibrillator: Secondary | ICD-10-CM | POA: Diagnosis not present

## 2015-06-26 DIAGNOSIS — I472 Ventricular tachycardia: Secondary | ICD-10-CM | POA: Diagnosis not present

## 2015-06-26 DIAGNOSIS — R9431 Abnormal electrocardiogram [ECG] [EKG]: Secondary | ICD-10-CM | POA: Diagnosis not present

## 2015-06-27 ENCOUNTER — Telehealth: Payer: Self-pay

## 2015-06-27 DIAGNOSIS — G4730 Sleep apnea, unspecified: Secondary | ICD-10-CM

## 2015-06-27 DIAGNOSIS — J449 Chronic obstructive pulmonary disease, unspecified: Secondary | ICD-10-CM

## 2015-06-27 NOTE — Telephone Encounter (Signed)
Patient has an appointment in December with Dr Lawerance CruelJavaids.

## 2015-06-28 ENCOUNTER — Other Ambulatory Visit: Payer: Self-pay | Admitting: Family Medicine

## 2015-06-29 DIAGNOSIS — Z1212 Encounter for screening for malignant neoplasm of rectum: Secondary | ICD-10-CM | POA: Diagnosis not present

## 2015-06-29 DIAGNOSIS — Z1211 Encounter for screening for malignant neoplasm of colon: Secondary | ICD-10-CM | POA: Diagnosis not present

## 2015-06-30 LAB — COLOGUARD: Cologuard: NEGATIVE

## 2015-07-10 DIAGNOSIS — G4733 Obstructive sleep apnea (adult) (pediatric): Secondary | ICD-10-CM | POA: Diagnosis not present

## 2015-07-11 ENCOUNTER — Telehealth: Payer: Self-pay | Admitting: Family Medicine

## 2015-07-11 DIAGNOSIS — R062 Wheezing: Secondary | ICD-10-CM | POA: Diagnosis not present

## 2015-07-11 DIAGNOSIS — J449 Chronic obstructive pulmonary disease, unspecified: Secondary | ICD-10-CM | POA: Diagnosis not present

## 2015-07-11 DIAGNOSIS — Z95810 Presence of automatic (implantable) cardiac defibrillator: Secondary | ICD-10-CM | POA: Diagnosis not present

## 2015-07-11 DIAGNOSIS — J441 Chronic obstructive pulmonary disease with (acute) exacerbation: Secondary | ICD-10-CM | POA: Diagnosis not present

## 2015-07-11 DIAGNOSIS — G4733 Obstructive sleep apnea (adult) (pediatric): Secondary | ICD-10-CM | POA: Diagnosis not present

## 2015-07-11 DIAGNOSIS — R0602 Shortness of breath: Secondary | ICD-10-CM | POA: Diagnosis not present

## 2015-07-11 NOTE — Telephone Encounter (Signed)
Please call patient: Cologuard test was negative. We will need to repeat colon cancer screening in 3 years.

## 2015-07-11 NOTE — Telephone Encounter (Signed)
Patient aware of cologaurd results and recommendations. 

## 2015-07-13 DIAGNOSIS — R9431 Abnormal electrocardiogram [ECG] [EKG]: Secondary | ICD-10-CM | POA: Diagnosis not present

## 2015-07-13 DIAGNOSIS — R3981 Functional urinary incontinence: Secondary | ICD-10-CM | POA: Diagnosis not present

## 2015-07-13 DIAGNOSIS — E039 Hypothyroidism, unspecified: Secondary | ICD-10-CM | POA: Diagnosis not present

## 2015-07-13 DIAGNOSIS — Z0000 Encounter for general adult medical examination without abnormal findings: Secondary | ICD-10-CM | POA: Diagnosis not present

## 2015-07-13 DIAGNOSIS — F330 Major depressive disorder, recurrent, mild: Secondary | ICD-10-CM | POA: Diagnosis not present

## 2015-07-13 DIAGNOSIS — J410 Simple chronic bronchitis: Secondary | ICD-10-CM | POA: Diagnosis not present

## 2015-07-13 DIAGNOSIS — I5030 Unspecified diastolic (congestive) heart failure: Secondary | ICD-10-CM | POA: Diagnosis not present

## 2015-07-21 LAB — LIPID PANEL
Cholesterol: 165 mg/dL (ref 0–200)
HDL: 50 mg/dL (ref 35–70)
LDL Cholesterol: 96 mg/dL
TC/HDL: 3.3
TRIGLYCERIDES: 93 mg/dL (ref 40–160)

## 2015-07-21 LAB — HEMOGLOBIN A1C: HEMOGLOBIN A1C: 5.4

## 2015-08-02 ENCOUNTER — Telehealth: Payer: Self-pay | Admitting: Family Medicine

## 2015-08-02 DIAGNOSIS — R82994 Hypercalciuria: Secondary | ICD-10-CM

## 2015-08-02 NOTE — Telephone Encounter (Signed)
OK, to place new referral.

## 2015-08-02 NOTE — Telephone Encounter (Signed)
New referral placed.

## 2015-08-02 NOTE — Telephone Encounter (Signed)
Patient called and wants another referral to endo. Thanks

## 2015-08-03 DIAGNOSIS — G4733 Obstructive sleep apnea (adult) (pediatric): Secondary | ICD-10-CM | POA: Diagnosis not present

## 2015-08-03 DIAGNOSIS — J449 Chronic obstructive pulmonary disease, unspecified: Secondary | ICD-10-CM | POA: Diagnosis not present

## 2015-08-03 DIAGNOSIS — R0602 Shortness of breath: Secondary | ICD-10-CM | POA: Diagnosis not present

## 2015-08-15 ENCOUNTER — Encounter: Payer: Self-pay | Admitting: Family Medicine

## 2015-08-16 ENCOUNTER — Encounter: Payer: Self-pay | Admitting: Family Medicine

## 2015-08-21 ENCOUNTER — Ambulatory Visit: Payer: Commercial Managed Care - HMO | Admitting: Family Medicine

## 2015-08-29 ENCOUNTER — Other Ambulatory Visit: Payer: Self-pay | Admitting: Family Medicine

## 2015-08-31 ENCOUNTER — Ambulatory Visit: Payer: Commercial Managed Care - HMO | Admitting: Family Medicine

## 2015-09-01 ENCOUNTER — Other Ambulatory Visit: Payer: Self-pay | Admitting: *Deleted

## 2015-09-01 ENCOUNTER — Other Ambulatory Visit: Payer: Self-pay | Admitting: Family Medicine

## 2015-09-01 MED ORDER — ALPRAZOLAM 0.5 MG PO TABS: ORAL_TABLET | ORAL | Status: DC

## 2015-09-25 DIAGNOSIS — R9431 Abnormal electrocardiogram [ECG] [EKG]: Secondary | ICD-10-CM | POA: Diagnosis not present

## 2015-09-25 DIAGNOSIS — I472 Ventricular tachycardia: Secondary | ICD-10-CM | POA: Diagnosis not present

## 2015-09-25 DIAGNOSIS — Z95810 Presence of automatic (implantable) cardiac defibrillator: Secondary | ICD-10-CM | POA: Diagnosis not present

## 2015-10-17 ENCOUNTER — Telehealth: Payer: Self-pay

## 2015-10-17 NOTE — Telephone Encounter (Signed)
She doesn't have any conditions to preclude her from jury duty.

## 2015-10-17 NOTE — Telephone Encounter (Signed)
Notified patient.

## 2015-11-06 ENCOUNTER — Ambulatory Visit: Payer: Commercial Managed Care - HMO | Admitting: Family Medicine

## 2015-11-30 ENCOUNTER — Telehealth: Payer: Self-pay

## 2015-11-30 NOTE — Telephone Encounter (Signed)
Robin Murillo would like to have lab work tomorrow for her appointment on Tuesday. Which labs would you like me to order?

## 2015-12-01 NOTE — Telephone Encounter (Signed)
Tsh, vit D, CMP, A1C

## 2015-12-04 ENCOUNTER — Telehealth: Payer: Self-pay

## 2015-12-04 DIAGNOSIS — F329 Major depressive disorder, single episode, unspecified: Secondary | ICD-10-CM | POA: Diagnosis not present

## 2015-12-04 DIAGNOSIS — R7301 Impaired fasting glucose: Secondary | ICD-10-CM | POA: Diagnosis not present

## 2015-12-04 DIAGNOSIS — E039 Hypothyroidism, unspecified: Secondary | ICD-10-CM

## 2015-12-04 DIAGNOSIS — F32A Depression, unspecified: Secondary | ICD-10-CM

## 2015-12-04 DIAGNOSIS — Z1321 Encounter for screening for nutritional disorder: Secondary | ICD-10-CM | POA: Diagnosis not present

## 2015-12-04 NOTE — Telephone Encounter (Signed)
Ordered and patient notified.  

## 2015-12-05 ENCOUNTER — Encounter: Payer: Self-pay | Admitting: Family Medicine

## 2015-12-05 ENCOUNTER — Ambulatory Visit (INDEPENDENT_AMBULATORY_CARE_PROVIDER_SITE_OTHER): Payer: Commercial Managed Care - HMO | Admitting: Family Medicine

## 2015-12-05 VITALS — BP 122/66 | HR 84 | Wt 248.0 lb

## 2015-12-05 DIAGNOSIS — F418 Other specified anxiety disorders: Secondary | ICD-10-CM | POA: Diagnosis not present

## 2015-12-05 DIAGNOSIS — Z0000 Encounter for general adult medical examination without abnormal findings: Secondary | ICD-10-CM

## 2015-12-05 DIAGNOSIS — I4901 Ventricular fibrillation: Secondary | ICD-10-CM | POA: Diagnosis not present

## 2015-12-05 LAB — COMPLETE METABOLIC PANEL WITH GFR
ALBUMIN: 3.9 g/dL (ref 3.6–5.1)
ALK PHOS: 169 U/L — AB (ref 33–130)
ALT: 16 U/L (ref 6–29)
AST: 18 U/L (ref 10–35)
BUN: 11 mg/dL (ref 7–25)
CALCIUM: 9.4 mg/dL (ref 8.6–10.4)
CHLORIDE: 103 mmol/L (ref 98–110)
CO2: 27 mmol/L (ref 20–31)
CREATININE: 0.84 mg/dL (ref 0.50–0.99)
GFR, Est African American: 84 mL/min (ref >=60)
GFR, Est Non African American: 73 mL/min (ref >=60)
Glucose, Bld: 116 mg/dL — ABNORMAL HIGH (ref 65–99)
Potassium: 4.7 mmol/L (ref 3.5–5.3)
Sodium: 138 mmol/L (ref 135–146)
TOTAL PROTEIN: 7.3 g/dL (ref 6.1–8.1)
Total Bilirubin: 0.4 mg/dL (ref 0.2–1.2)

## 2015-12-05 LAB — HEMOGLOBIN A1C
HEMOGLOBIN A1C: 6.6 % — AB (ref <5.7)
Mean Plasma Glucose: 143 mg/dL

## 2015-12-05 LAB — TSH: TSH: 1.72 mIU/L

## 2015-12-05 MED ORDER — FLUOXETINE HCL 40 MG PO CAPS: 40.0000 mg | ORAL_CAPSULE | Freq: Every day | ORAL | Status: DC

## 2015-12-05 MED ORDER — FLUOXETINE HCL 60 MG PO TABS: 60.0000 | ORAL_TABLET | Freq: Every day | ORAL | Status: DC

## 2015-12-05 MED ORDER — LEVOTHYROXINE SODIUM 125 MCG PO TABS: 125.0000 ug | ORAL_TABLET | Freq: Every day | ORAL | Status: DC

## 2015-12-05 NOTE — Progress Notes (Addendum)
Subjective:   Robin Murillo is a 65 y.o. female who presents for Medicare Annual (Subsequent) preventive examination.  Review of Systems:  Negative.         Objective:     Vitals: BP 122/66 mmHg  Pulse 84  Wt 248 lb (112.492 kg)  SpO2 96%  Body mass index is 42.55 kg/(m^2).   Tobacco History  Smoking status  . Former Smoker  . Quit date: 04/04/2003  Smokeless tobacco  . Not on file     Counseling given: Not Answered   Past Medical History  Diagnosis Date  . Heart disease     pacemaker/defiberlator  . Thyroid disease    Past Surgical History  Procedure Laterality Date  . Pacemaker placement      [facility]  . Cardiac [facility]      [facility]  . Card[facility] electrophys[facility] study and ablation      06/2010   Family History  Problem Relation Age of Onset  . Heart attack Brother   . Heart disease Sister     Murmur  . Thyroid disease Sister   . Diabetes Brother    History  Sexual Activity  . Sexual Activity: Not on file    Outpatient Encounter Prescriptions as of 12/05/2015  Medication Sig  . albuterol (VENTOLIN HFA) 108 (90 BASE) MCG/ACT inhaler Inhale 2 puffs into the lungs every 6 (six) hours as needed for wheezing or shortness of breath.  . ALPRAZolam (XANAX) 0.5 MG tablet TAKE ONE TABLET THREE TIMES DAILY AS NEEDED  . aspirin 81 MG tablet Take 81 mg by mouth daily.  . Cholecalciferol (VITAMIN D PO) Take 2,000 Int'l Units by mouth daily.   . Fluticasone-Salmeterol (ADVAIR) 100-50 MCG/DOSE AEPB Inhale 1 puff into the lungs every 12 (twelve) hours.  Marland Kitchen. levothyroxine (SYNTHROID, LEVOTHROID) 125 MCG tablet Take 1 tablet (125 mcg total) by mouth daily.  . magnesium oxide (MAG-OX) 400 MG tablet Take 400 mg by mouth 2 (two) times daily.  . metoprolol (LOPRESSOR) 50 MG tablet Take 50 mg by mouth 2 (two) times daily.  . [DISCONTINUED] FLUoxetine (PROZAC) 40 MG capsule TAKE 1 CAPSULE EVERY DAY  . [DISCONTINUED] FLUoxetine  (PROZAC) 40 MG capsule Take 1 capsule (40 mg total) by mouth daily.  . [DISCONTINUED] levothyroxine (SYNTHROID, LEVOTHROID) 125 MCG tablet TAKE 1 TABLET DAILY.  Marland Kitchen. FLUoxetine HCl 60 MG TABS Take 60 tablets by mouth daily.   No facility-administered encounter medications on file as of 12/05/2015.    Activities of Daily Living In your present state of health, do you have any difficulty performing the following activities: 12/07/2015  Hearing? N  Vision? N  Difficulty concentrating or making decisions? N  Walking or climbing stairs? Y  Dressing or bathing? N  Doing errands, shopping? N    Patient Care Team: Agapito Gamesatherine D Ossie Beltran, MD as PCP - General (Family Medicine) Linton RumpAdnan Javaids, MD as Referring Physician (Pulmonary Disease) Louie BunMark Anthony Mitchell, MD as Referring Physician (Cardiology)    Assessment:     Depression - PHQ 9 score of 11 today. Discussed increasing her fluoxetine to 60 mg. She feels like she's been more anxious lately and felt a little bit more down. No thoughts of harming herself. She said she was diagnosed with OCD years ago and feels like some of those thoughts are creeping back in more recently. She denies any increased stressors or triggers recently.   Exercise Activities and Dietary recommendations    Goals  None     Fall Risk Fall Risk  12/05/2015 10/12/2014 09/21/2013 09/21/2013 01/13/2013  Falls in the past year? Yes Yes Yes Yes Yes  Number falls in past yr: Injury with Fall? No No Yes Yes Yes  Risk for fall due to : Impaired mobility Impaired balance/gait - - -  Follow up Education provided Education provided - - -   Depression Screen PHQ 2/9 Scores 12/05/2015 10/12/2014 09/21/2013 09/21/2013  PHQ - 2 Score 4 0 3 4  PHQ- 9 Score 11 - 4 6     Cognitive Testing No flowsheet data found.  6 -CIT normal.   Immunization History  Administered Date(s) Administered  . Influenza,inj,Quad PF,36+ Mos 04/27/2014, 05/05/2015  . Influenza-Unspecified  04/08/2013  . Pneumococcal Polysaccharide-23 04/21/2013  . Td 05/10/2008  . Tdap 05/10/2008  . Zoster 10/01/2012   Screening Tests Health Maintenance  Topic Date Due  . FOOT EXAM  10/23/1960  . OPHTHALMOLOGY EXAM  10/23/1960  . DEXA SCAN  10/24/2015  . PNA vac Low Risk Adult (1 of 2 - PCV13) 10/24/2015  . INFLUENZA VACCINE  02/13/2016  . HEMOGLOBIN A1C  06/05/2016  . MAMMOGRAM  10/19/2016  . TETANUS/TDAP  05/10/2018  . Fecal DNA (Cologuard)  06/29/2018  . ZOSTAVAX  Completed  . Hepatitis C Screening  Completed  . HIV Screening  Completed      Plan:  Medicare Wellness Exam  During the course of the visit the patient was educated and counseled about the following appropriate screening and preventive services:   Vaccines to include Pneumoccal, Influenza, Hepatitis B, Td, Zostavax, HCV  Electrocardiogram - Rate of 69 bpm with left axis deviation with incomplete Right BBB.  No change from prior  Cardiovascular Disease  Colorectal cancer screening  Bone density screening - performed at [facility] last year. Will call to get a copy.  Diabetes screening  Glaucoma screening  Mammography/PAP  Nutrition counseling   Depression - her PHQ 9 score was elevated today at 11. Recommend increasing fluoxetine to 60 mg per LC her back in about 6-8 weeks to make sure that she is doing well.  Patient Instructions (the written plan) was given to the patient.   Marwan Lipe, MD  12/07/2015

## 2015-12-06 ENCOUNTER — Encounter: Payer: Self-pay | Admitting: Family Medicine

## 2015-12-06 LAB — VITAMIN D 25 HYDROXY (VIT D DEFICIENCY, FRACTURES): Vit D, 25-Hydroxy: 28 ng/mL — ABNORMAL LOW (ref 30–100)

## 2015-12-06 MED ORDER — ERGOCALCIFEROL 1.25 MG (50000 UT) PO CAPS: 50000.0000 [IU] | ORAL_CAPSULE | ORAL | Status: DC

## 2015-12-07 ENCOUNTER — Ambulatory Visit (INDEPENDENT_AMBULATORY_CARE_PROVIDER_SITE_OTHER): Payer: Commercial Managed Care - HMO | Admitting: Family Medicine

## 2015-12-07 VITALS — BP 129/42 | HR 83

## 2015-12-07 DIAGNOSIS — Z0000 Encounter for general adult medical examination without abnormal findings: Secondary | ICD-10-CM

## 2015-12-07 NOTE — Addendum Note (Signed)
Addended by: Nani GasserMETHENEY, Robyn Nohr D on: 12/07/2015 05:21 PM   Modules accepted: Kipp BroodSmartSet

## 2015-12-07 NOTE — Addendum Note (Signed)
Addended by: Nani GasserMETHENEY, CATHERINE D on: 12/07/2015 09:54 PM   Modules accepted: Kipp BroodSmartSet

## 2015-12-07 NOTE — Progress Notes (Signed)
   Subjective:    Patient ID: Robin Murillo, female    DOB: 05-15-1951, 65 y.o.   MRN: [id number] Pt in this morning for her welcome to medicare EKG.  Donne AnonAmber Rahel Carlton, CMA HPI    Review of Systems     Objective:   Physical Exam        Assessment & Plan:  Please see interpretation in Medicare wellness note.

## 2015-12-13 ENCOUNTER — Other Ambulatory Visit: Payer: Self-pay | Admitting: *Deleted

## 2015-12-13 DIAGNOSIS — F418 Other specified anxiety disorders: Secondary | ICD-10-CM

## 2015-12-13 MED ORDER — FLUOXETINE HCL 60 MG PO TABS: 60.0000 mg | ORAL_TABLET | Freq: Every day | ORAL | Status: DC

## 2015-12-15 ENCOUNTER — Telehealth: Payer: Self-pay | Admitting: *Deleted

## 2015-12-15 MED ORDER — FLUOXETINE HCL 20 MG PO CAPS: 60.0000 mg | ORAL_CAPSULE | Freq: Every day | ORAL | Status: DC

## 2015-12-15 NOTE — Telephone Encounter (Signed)
OK, changed back to 20mg  and can take 3 a day . Can take all at once.

## 2015-12-15 NOTE — Telephone Encounter (Signed)
Pt's husband called and stated that the 60 mg of fluoxetine is $131 which puts this medication in a tier 3 and he cannot afford this. He is asking if Dr. Linford ArnoldMetheney could lower the strength and increase the amount of tablets. He has called [employer] to stop the shipment of this medication. If Dr. Linford ArnoldMetheney is ok with this he would like for her to go ahead and send this into the mail order for her. Will fwd to pcp for f/u.Loralee PacasBarkley, Mikya Don [place]

## 2015-12-18 NOTE — Telephone Encounter (Signed)
Pt's husband informed.Loralee PacasBarkley, Shaila Gilchrest [place]

## 2016-01-15 ENCOUNTER — Encounter: Payer: Self-pay | Admitting: Family Medicine

## 2016-01-15 ENCOUNTER — Ambulatory Visit (INDEPENDENT_AMBULATORY_CARE_PROVIDER_SITE_OTHER): Payer: Commercial Managed Care - HMO | Admitting: Family Medicine

## 2016-01-15 VITALS — BP 125/51 | HR 89 | Wt 245.0 lb

## 2016-01-15 DIAGNOSIS — Z1231 Encounter for screening mammogram for malignant neoplasm of breast: Secondary | ICD-10-CM

## 2016-01-15 DIAGNOSIS — R809 Proteinuria, unspecified: Secondary | ICD-10-CM

## 2016-01-15 DIAGNOSIS — F418 Other specified anxiety disorders: Secondary | ICD-10-CM

## 2016-01-15 DIAGNOSIS — E1129 Type 2 diabetes mellitus with other diabetic kidney complication: Secondary | ICD-10-CM | POA: Insufficient documentation

## 2016-01-15 DIAGNOSIS — Z23 Encounter for immunization: Secondary | ICD-10-CM | POA: Diagnosis not present

## 2016-01-15 DIAGNOSIS — E119 Type 2 diabetes mellitus without complications: Secondary | ICD-10-CM

## 2016-01-15 LAB — POCT UA - MICROALBUMIN
CREATININE, POC: 200 mg/dL
MICROALBUMIN (UR) POC: 80 mg/L

## 2016-01-15 MED ORDER — ALPRAZOLAM 0.5 MG PO TABS: 0.5000 mg | ORAL_TABLET | Freq: Every day | ORAL | Status: DC | PRN

## 2016-01-15 NOTE — Progress Notes (Signed)
Subjective:    CC: Mood  HPI: Follow-up depression with anxiety - -she says she still feels down several days of the week. No thoughts of hurting herself. She does complain of low energy. She is doing really well on the increased fluoxetine dose. She's currently on 60 mg daily and says she feels like it's overall working really well. She has been a little bit down recently because her neighbor of 20 some years is currently on hospice so this has been stressful for her but otherwise she feels like she is handling things pretty well. She denies any side effects after the increased dose. She only uses her xanax about once a week.   Diabetes - no hypoglycemic events. No wounds or sores that are not healing well. No increased thirst or urination.    Past medical history, Surgical history, Family history not pertinant except as noted below, Social history, Allergies, and medications have been entered into the medical record, reviewed, and corrections made.   Review of Systems: No fevers, chills, night sweats, weight loss, chest pain, or shortness of breath.   Objective:    General: Well Developed, well nourished, and in no acute distress.  Neuro: Alert and oriented x3, extra-ocular muscles intact, sensation grossly intact.  HEENT: Normocephalic, atraumatic  Skin: Warm and dry, no rashes. Cardiac: Regular rate and rhythm, no murmurs rubs or gallops, no lower extremity edema.  Respiratory: Clear to auscultation bilaterally. Not using accessory muscles, speaking in full sentences.   Impression and Recommendations:    Depression with anxiety-PHQ 9 score of 3 today, previous of 11. She rates her symptoms as not difficult at all. Continue current regimen of 60 mg daily. Follow-up in 3 months.   DM- F/U in 2 months for Diabets.  Urine micro done today.  Reminded to get her eye exam.    Reminded her that she is due for her mammogram.

## 2016-01-17 DIAGNOSIS — R9431 Abnormal electrocardiogram [ECG] [EKG]: Secondary | ICD-10-CM | POA: Diagnosis not present

## 2016-01-17 DIAGNOSIS — Z4502 Encounter for adjustment and management of automatic implantable cardiac defibrillator: Secondary | ICD-10-CM | POA: Diagnosis not present

## 2016-01-17 DIAGNOSIS — I472 Ventricular tachycardia: Secondary | ICD-10-CM | POA: Diagnosis not present

## 2016-01-17 DIAGNOSIS — Z95810 Presence of automatic (implantable) cardiac defibrillator: Secondary | ICD-10-CM | POA: Diagnosis not present

## 2016-01-17 DIAGNOSIS — I4901 Ventricular fibrillation: Secondary | ICD-10-CM | POA: Diagnosis not present

## 2016-02-09 ENCOUNTER — Ambulatory Visit: Payer: Commercial Managed Care - HMO

## 2016-04-16 ENCOUNTER — Ambulatory Visit: Payer: Commercial Managed Care - HMO | Admitting: Family Medicine

## 2016-04-19 ENCOUNTER — Encounter: Payer: Self-pay | Admitting: Family Medicine

## 2016-04-19 ENCOUNTER — Ambulatory Visit (INDEPENDENT_AMBULATORY_CARE_PROVIDER_SITE_OTHER): Payer: Commercial Managed Care - HMO | Admitting: Family Medicine

## 2016-04-19 VITALS — BP 139/51 | HR 70 | Wt 249.0 lb

## 2016-04-19 DIAGNOSIS — F418 Other specified anxiety disorders: Secondary | ICD-10-CM | POA: Diagnosis not present

## 2016-04-19 DIAGNOSIS — Z23 Encounter for immunization: Secondary | ICD-10-CM

## 2016-04-19 DIAGNOSIS — E119 Type 2 diabetes mellitus without complications: Secondary | ICD-10-CM | POA: Diagnosis not present

## 2016-04-19 LAB — POCT GLYCOSYLATED HEMOGLOBIN (HGB A1C): Hemoglobin A1C: 6

## 2016-04-19 NOTE — Patient Instructions (Addendum)
Please come in fasting for your labs when you make your appointment for January as he will be due for cholesterol and blood work.   Diabetes and Exercise Exercising regularly is important. It is not just about losing weight. It has many health benefits, such as:  Improving your overall fitness, flexibility, and endurance.  Increasing your bone density.  Helping with weight control.  Decreasing your body fat.  Increasing your muscle strength.  Reducing stress and tension.  Improving your overall health. People with diabetes who exercise gain additional benefits because exercise:  Reduces appetite.  Improves the body's use of blood sugar (glucose).  Helps lower or control blood glucose.  Decreases blood pressure.  Helps control blood lipids (such as cholesterol and triglycerides).  Improves the body's use of the hormone insulin by:  Increasing the body's insulin sensitivity.  Reducing the body's insulin needs.  Decreases the risk for heart disease because exercising:  Lowers cholesterol and triglycerides levels.  Increases the levels of good cholesterol (such as high-density lipoproteins [HDL]) in the body.  Lowers blood glucose levels. YOUR ACTIVITY PLAN  Choose an activity that you enjoy, and set realistic goals. To exercise safely, you should begin practicing any new physical activity slowly, and gradually increase the intensity of the exercise over time. Your health care provider or diabetes educator can help create an activity plan that works for you. General recommendations include:  Encouraging children to engage in at least 60 minutes of physical activity each day.  Stretching and performing strength training exercises, such as yoga or weight lifting, at least 2 times per week.  Performing a total of at least 150 minutes of moderate-intensity exercise each week, such as brisk walking or water aerobics.  Exercising at least 3 days per week, making sure you  allow no more than 2 consecutive days to pass without exercising.  Avoiding long periods of inactivity (90 minutes or more). When you have to spend an extended period of time sitting down, take frequent breaks to walk or stretch. RECOMMENDATIONS FOR EXERCISING WITH TYPE 1 OR TYPE 2 DIABETES   Check your blood glucose before exercising. If blood glucose levels are greater than 240 mg/dL, check for urine ketones. Do not exercise if ketones are present.  Avoid injecting insulin into areas of the body that are going to be exercised. For example, avoid injecting insulin into:  The arms when playing tennis.  The legs when jogging.  Keep a record of:  Food intake before and after you exercise.  Expected peak times of insulin action.  Blood glucose levels before and after you exercise.  The type and amount of exercise you have done.  Review your records with your health care provider. Your health care provider will help you to develop guidelines for adjusting food intake and insulin amounts before and after exercising.  If you take insulin or oral hypoglycemic agents, watch for signs and symptoms of hypoglycemia. They include:  Dizziness.  Shaking.  Sweating.  Chills.  Confusion.  Drink plenty of water while you exercise to prevent dehydration or heat stroke. Body water is lost during exercise and must be replaced.  Talk to your health care provider before starting an exercise program to make sure it is safe for you. Remember, almost any type of activity is better than none.   This information is not intended to replace advice given to you by your health care provider. Make sure you discuss any questions you have with your health care provider.  Document Released: 09/21/2003 Document Revised: 11/15/2014 Document Reviewed: 12/08/2012 Elsevier Interactive Patient Education [employer].

## 2016-04-19 NOTE — Progress Notes (Signed)
Subjective:    CC:   HPI:  Diabetes - no hypoglycemic events. No wounds or sores that are not healing well. No increased thirst or urination. Checking glucose at home. Taking medications as prescribed without any side effects.She reports that her last eye exam was about a [facility]. We will call to get that report.  Depression w/ anxiety - last PHQ - 9 score was 3.  On prozac 60mg  daily.  She is happy with her regimen and says she is really not had any more emotional outbursts she put feels like it's really helped with that.  Past medical history, Surgical history, Family history not pertinant except as noted below, Social history, Allergies, and medications have been entered into the medical record, reviewed, and corrections made.   Review of Systems: No fevers, chills, night sweats, weight loss, chest pain, or shortness of breath.   Objective:    General: Well Developed, well nourished, and in no acute distress.  Neuro: Alert and oriented x3, extra-ocular muscles intact, sensation grossly intact.  HEENT: Normocephalic, atraumatic  Skin: Warm and dry, no rashes. Cardiac: Regular rate and rhythm, no murmurs rubs or gallops, no lower extremity edema.  Respiratory: Clear to auscultation bilaterally. Not using accessory muscles, speaking in full sentences.   Impression and Recommendations:    DM- Much improved. Hemoglobin A1c down to 6.0 from 6.6. Continue to work on healthy diet weight loss and exercise and let's see her back in 3 months. Explained that if we can keep it trending downward then we can keep her off of medication. Will call for recent diabetic eye exam.  Depression w/ anxiety - under fair control. PHQ 9 score of   9 today which is up from previous. But she feels like she is happy with her current regimen so again we will monitor this and reevaluate in 3 months.

## 2016-04-29 ENCOUNTER — Encounter: Payer: Self-pay | Admitting: Family Medicine

## 2016-07-10 ENCOUNTER — Other Ambulatory Visit: Payer: Self-pay | Admitting: Family Medicine

## 2016-07-18 ENCOUNTER — Ambulatory Visit (INDEPENDENT_AMBULATORY_CARE_PROVIDER_SITE_OTHER): Payer: Commercial Managed Care - HMO | Admitting: Family Medicine

## 2016-07-18 ENCOUNTER — Encounter: Payer: Self-pay | Admitting: Family Medicine

## 2016-07-18 VITALS — BP 112/54 | HR 76 | Ht 66.0 in | Wt 245.0 lb

## 2016-07-18 DIAGNOSIS — G4733 Obstructive sleep apnea (adult) (pediatric): Secondary | ICD-10-CM

## 2016-07-18 DIAGNOSIS — J449 Chronic obstructive pulmonary disease, unspecified: Secondary | ICD-10-CM | POA: Diagnosis not present

## 2016-07-18 DIAGNOSIS — E119 Type 2 diabetes mellitus without complications: Secondary | ICD-10-CM | POA: Diagnosis not present

## 2016-07-18 DIAGNOSIS — F418 Other specified anxiety disorders: Secondary | ICD-10-CM | POA: Diagnosis not present

## 2016-07-18 LAB — LIPID PANEL
CHOL/HDL RATIO: 4.1 ratio (ref <5.0)
Cholesterol: 167 mg/dL (ref <200)
HDL: 41 mg/dL — ABNORMAL LOW (ref >50)
LDL Cholesterol: 107 mg/dL — ABNORMAL HIGH (ref <100)
Triglycerides: 96 mg/dL (ref <150)
VLDL: 19 mg/dL (ref <30)

## 2016-07-18 LAB — COMPLETE METABOLIC PANEL WITH GFR
ALT: 17 U/L (ref 6–29)
AST: 19 U/L (ref 10–35)
Albumin: 3.9 g/dL (ref 3.6–5.1)
Alkaline Phosphatase: 155 U/L — ABNORMAL HIGH (ref 33–130)
BUN: 12 mg/dL (ref 7–25)
CHLORIDE: 104 mmol/L (ref 98–110)
CO2: 23 mmol/L (ref 20–31)
Calcium: 9.4 mg/dL (ref 8.6–10.4)
Creat: 0.94 mg/dL (ref 0.50–0.99)
GFR, Est African American: 74 mL/min (ref >=60)
GFR, Est Non African American: 64 mL/min (ref >=60)
Glucose, Bld: 112 mg/dL — ABNORMAL HIGH (ref 65–99)
POTASSIUM: 4.7 mmol/L (ref 3.5–5.3)
SODIUM: 137 mmol/L (ref 135–146)
Total Bilirubin: 0.5 mg/dL (ref 0.2–1.2)
Total Protein: 7.5 g/dL (ref 6.1–8.1)

## 2016-07-18 LAB — POCT GLYCOSYLATED HEMOGLOBIN (HGB A1C): HEMOGLOBIN A1C: 5.8

## 2016-07-18 MED ORDER — AMBULATORY NON FORMULARY MEDICATION
0 refills | Status: DC
Start: 2016-07-18 — End: 2019-05-19

## 2016-07-18 MED ORDER — ALBUTEROL SULFATE HFA 108 (90 BASE) MCG/ACT IN AERS: 2.0000 | INHALATION_SPRAY | Freq: Four times a day (QID) | RESPIRATORY_TRACT | 99 refills | Status: DC | PRN

## 2016-07-18 NOTE — Progress Notes (Signed)
Subjective:    CC: DM  HPI: Diabetes - no hypoglycemic events. No wounds or sores that are not healing well. No increased thirst or urination. Checking glucose at home. Taking medications as prescribed without any side effects.  OSA - wears her CPAP some.He says it doesn't wear it consistently. She says sometimes she wonders if her pressure might actually need to be increased. She thinks it currently set at 4 cm of water pressure. She uses [employer]  COPD  - no recent exacerbations. She's not had use her albuterol recently. She says she does use her Advair but again only occasionally. She says sometimes she just forgets to take it.  Past medical history, Surgical history, Family history not pertinant except as noted below, Social history, Allergies, and medications have been entered into the medical record, reviewed, and corrections made.   Review of Systems: No fevers, chills, night sweats, weight loss, chest pain, or shortness of breath.   Objective:    General: Well Developed, well nourished, and in no acute distress.  Neuro: Alert and oriented x3, extra-ocular muscles intact, sensation grossly intact.  HEENT: Normocephalic, atraumatic  Skin: Warm and dry, no rashes. Cardiac: Regular rate and rhythm, no murmurs rubs or gallops, no lower extremity edema.  Respiratory: Clear to auscultation bilaterally. Not using accessory muscles, speaking in full sentences.   Impression and Recommendations:    DM - Well controlled. Continue current regimen. Follow up in  3-4 months.    COPD -   table. Did encourage her to use her Advair more regularly. I certainly encouraged her to call me if she runs into any problems as far circumflex co-pay this year with her health insurance.  Sleep apnea - Not wearing it consistantly.  Will call [employer] for download. Her pressure needs to be adjusted. She says her machine does have a dialogue Encouraged her to go ahead and turned up to 6 minutes centimeters of water  pressure and try to wear it consistently for the next 2 weeks and we will call [facility] try to get a download.

## 2016-08-19 ENCOUNTER — Other Ambulatory Visit: Payer: Self-pay | Admitting: Family Medicine

## 2017-01-10 ENCOUNTER — Other Ambulatory Visit: Payer: Self-pay | Admitting: Family Medicine

## 2017-01-16 ENCOUNTER — Telehealth: Payer: Self-pay | Admitting: Family Medicine

## 2017-01-16 NOTE — Telephone Encounter (Signed)
Error

## 2017-01-16 NOTE — Telephone Encounter (Signed)
I called patient and left a message for pt to F/u with Dr.Metheney for Hypothyroid, Depression and Diabetes

## 2017-01-16 NOTE — Telephone Encounter (Signed)
Patient called and scheduled an appointment for Tuesday and she needs a new script for Prozac she takes 3 tabs a day and its mail order [employer], [facility] but it takes a week or so before she receives the med so wanted to put the request in now so she doesn't run out. Thanks

## 2017-01-17 DIAGNOSIS — Z4502 Encounter for adjustment and management of automatic implantable cardiac defibrillator: Secondary | ICD-10-CM | POA: Diagnosis not present

## 2017-01-17 DIAGNOSIS — I472 Ventricular tachycardia: Secondary | ICD-10-CM | POA: Diagnosis not present

## 2017-01-17 DIAGNOSIS — I4581 Long QT syndrome: Secondary | ICD-10-CM | POA: Insufficient documentation

## 2017-01-17 NOTE — Telephone Encounter (Signed)
On 01/11/17 a 3 month supply was sent to mail pharmacy [facility]

## 2017-01-21 ENCOUNTER — Ambulatory Visit (INDEPENDENT_AMBULATORY_CARE_PROVIDER_SITE_OTHER): Payer: Medicare HMO | Admitting: Family Medicine

## 2017-01-21 ENCOUNTER — Encounter: Payer: Self-pay | Admitting: Family Medicine

## 2017-01-21 VITALS — BP 119/58 | HR 70 | Ht 63.0 in | Wt 241.0 lb

## 2017-01-21 DIAGNOSIS — Z1231 Encounter for screening mammogram for malignant neoplasm of breast: Secondary | ICD-10-CM | POA: Diagnosis not present

## 2017-01-21 DIAGNOSIS — E119 Type 2 diabetes mellitus without complications: Secondary | ICD-10-CM | POA: Diagnosis not present

## 2017-01-21 DIAGNOSIS — E559 Vitamin D deficiency, unspecified: Secondary | ICD-10-CM

## 2017-01-21 DIAGNOSIS — E039 Hypothyroidism, unspecified: Secondary | ICD-10-CM | POA: Diagnosis not present

## 2017-01-21 DIAGNOSIS — F418 Other specified anxiety disorders: Secondary | ICD-10-CM

## 2017-01-21 NOTE — Progress Notes (Signed)
Subjective:    CC: thyroid, DM, mOod  HPI:  Hypothyroidism-no recent skin or hair changes. No significant changes in weight. That she says she has lost a little. She's been trying to walk more consistently. She feels like her medication is working well and she says she's taking it regularly.  Diabetes - no hypoglycemic events. No wounds or sores that are not healing well. No increased thirst or urination. Checking glucose at home. Taking medications as prescribed without any side effects. He thinks her last eye exam was last year it once tonight. Will call for most recent report.   Depression/Anxiety - has felt more down and sad and irritable to lately. She says sometimes even little things will get her very angry. She's currently on fluoxetine 60 mg daily. Needs Xanax as needed.  Vitamin D deficiency-still taking her 5000 unit caplets once per week. She says she only has a couple left.  Past medical history, Surgical history, Family history not pertinant except as noted below, Social history, Allergies, and medications have been entered into the medical record, reviewed, and corrections made.   Review of Systems: No fevers, chills, night sweats, weight loss, chest pain, or shortness of breath.   Objective:    General: Well Developed, well nourished, and in no acute distress.  Neuro: Alert and oriented x3, extra-ocular muscles intact, sensation grossly intact.  HEENT: Normocephalic, atraumatic  Skin: Warm and dry, no rashes. Cardiac: Regular rate and rhythm, no murmurs rubs or gallops, no lower extremity edema.  Respiratory: Clear to auscultation bilaterally. Not using accessory muscles, speaking in full sentences.   Impression and Recommendations:    Hypo-thyroidism-due to recheck TSH. Will call adjust medication as needed.  Diabetes-well controlled but A1c is up from previous. Today was 6.3. Previous was 5.8. Mixture working on healthy diet and regular exercise and weight loss and  recheck in 3 months. Due for BMP as well.  Depression/Anxiety - continue current regimen but offered to refer her to psychiatry discuss further treatment options. She says she will think about it and let me know.  Vitamin D deficiency-due to recheck levels.

## 2017-01-22 LAB — TSH: TSH: 3.24 mIU/L

## 2017-01-22 LAB — BASIC METABOLIC PANEL WITH GFR
BUN: 14 mg/dL (ref 7–25)
CHLORIDE: 104 mmol/L (ref 98–110)
CO2: 21 mmol/L (ref 20–31)
CREATININE: 0.95 mg/dL (ref 0.50–0.99)
Calcium: 9.6 mg/dL (ref 8.6–10.4)
GFR, Est African American: 72 mL/min (ref >=60)
GFR, Est Non African American: 63 mL/min (ref >=60)
Glucose, Bld: 107 mg/dL — ABNORMAL HIGH (ref 65–99)
Potassium: 4.5 mmol/L (ref 3.5–5.3)
Sodium: 137 mmol/L (ref 135–146)

## 2017-01-22 LAB — POCT GLYCOSYLATED HEMOGLOBIN (HGB A1C): HEMOGLOBIN A1C: 6.3

## 2017-01-22 LAB — VITAMIN D 25 HYDROXY (VIT D DEFICIENCY, FRACTURES): VIT D 25 HYDROXY: 39 ng/mL (ref 30–100)

## 2017-03-03 ENCOUNTER — Telehealth: Payer: Self-pay | Admitting: *Deleted

## 2017-03-03 NOTE — Telephone Encounter (Signed)
Pt was told to sign a ROI . Pt told to come by at anytime to sign.Loralee Pacas [place]

## 2017-03-19 ENCOUNTER — Other Ambulatory Visit: Payer: Self-pay | Admitting: Family Medicine

## 2017-04-21 DIAGNOSIS — Z4502 Encounter for adjustment and management of automatic implantable cardiac defibrillator: Secondary | ICD-10-CM | POA: Diagnosis not present

## 2017-04-21 DIAGNOSIS — Z95810 Presence of automatic (implantable) cardiac defibrillator: Secondary | ICD-10-CM | POA: Diagnosis not present

## 2017-04-24 ENCOUNTER — Other Ambulatory Visit: Payer: Self-pay | Admitting: Family Medicine

## 2017-04-24 ENCOUNTER — Ambulatory Visit (INDEPENDENT_AMBULATORY_CARE_PROVIDER_SITE_OTHER): Payer: Medicare HMO | Admitting: Family Medicine

## 2017-04-24 ENCOUNTER — Telehealth: Payer: Self-pay | Admitting: *Deleted

## 2017-04-24 ENCOUNTER — Encounter: Payer: Self-pay | Admitting: Family Medicine

## 2017-04-24 VITALS — BP 119/63 | HR 74 | Ht 63.0 in | Wt 245.0 lb

## 2017-04-24 DIAGNOSIS — E119 Type 2 diabetes mellitus without complications: Secondary | ICD-10-CM

## 2017-04-24 DIAGNOSIS — Z6841 Body Mass Index (BMI) 40.0 and over, adult: Secondary | ICD-10-CM

## 2017-04-24 DIAGNOSIS — E1129 Type 2 diabetes mellitus with other diabetic kidney complication: Secondary | ICD-10-CM

## 2017-04-24 DIAGNOSIS — R809 Proteinuria, unspecified: Secondary | ICD-10-CM

## 2017-04-24 DIAGNOSIS — F418 Other specified anxiety disorders: Secondary | ICD-10-CM

## 2017-04-24 DIAGNOSIS — Z1231 Encounter for screening mammogram for malignant neoplasm of breast: Secondary | ICD-10-CM

## 2017-04-24 DIAGNOSIS — Z23 Encounter for immunization: Secondary | ICD-10-CM

## 2017-04-24 LAB — POCT UA - MICROALBUMIN
CREATININE, POC: 300 mg/dL
MICROALBUMIN (UR) POC: 80 mg/L

## 2017-04-24 LAB — POCT GLYCOSYLATED HEMOGLOBIN (HGB A1C): HEMOGLOBIN A1C: 11.4

## 2017-04-24 MED ORDER — METFORMIN HCL ER 500 MG PO TB24: 500.0000 mg | ORAL_TABLET | Freq: Every day | ORAL | 0 refills | Status: DC

## 2017-04-24 NOTE — Telephone Encounter (Signed)
It will not interact with the tolteradine but I don't know what vibegron is.  She may have to call her study center and ask.

## 2017-04-24 NOTE — Telephone Encounter (Signed)
Pt taking medication for her bladder. And she wanted to know if it would interact with the metformin.  Tolterodine, vibegron (these medications are part of a study that she is in with [facility]).   Will fwd to pcp for advice.Loralee Pacas [place]

## 2017-04-24 NOTE — Patient Instructions (Signed)
If you have time, please go downstairs and schedule your mammogram. The last one was 2 years ago.

## 2017-04-24 NOTE — Progress Notes (Signed)
Subjective:    CC: DM  HPI: Diabetes - no hypoglycemic events. No wounds or sores that are not healing well. No increased thirst or urination. Checking glucose at home. She is not currently on medications to control her diabetes she is actually been diet controlled for several years now.   Follow-up depression/anxiety-she was struggling emotionally when I saw her back in July. We did not make any adjustments to her regimen at that time.She says overall she feels like she's doing better. She feels much less irritable. She does think that increasing her medication has helped.  BMI 43-she's not very active. In fact, she has gained about 4 pounds since she was last here in July. I suspect this is contributed to her recent bump in her hemoglobin A1c.  Depression screen [facility] 2/9 04/24/2017 01/21/2017 01/15/2016 12/05/2015 10/12/2014  Decreased Interest 1 1 0 2 0  Down, Depressed, Hopeless 1 1 - 2 0  PHQ - 2 Score 2 2 0 4 0  Altered sleeping 0 2 0 0 -  Tired, decreased energy -  Change in appetite -  Feeling bad or failure about yourself  -  Trouble concentrating 0 1 0 0 -  Moving slowly or fidgety/restless 1 1 0 1 -  Suicidal thoughts 0 1 0 0 -  PHQ-9 Score -  Difficult doing work/chores - - Not difficult at all Somewhat difficult -     Past medical history, Surgical history, Family history not pertinant except as noted below, Social history, Allergies, and medications have been entered into the medical record, reviewed, and corrections made.   Review of Systems: No fevers, chills, night sweats, weight loss, chest pain, or shortness of breath.   Objective:    General: Well Developed, well nourished, and in no acute distress.  Neuro: Alert and oriented x3, extra-ocular muscles intact, sensation grossly intact.  HEENT: Normocephalic, atraumatic  Skin: Warm and dry, no rashes. Cardiac: Regular rate and rhythm, no murmurs rubs or gallops, no lower extremity  edema.  Respiratory: Clear to auscultation bilaterally. Not using accessory muscles, speaking in full sentences.   Impression and Recommendations:    DM With microalbuminuria- Discussed starting medication. Will start with metformin. Discussed potential side effects. Follow-up in 3 months. Due for urine microalbumin and foot exam today. Also due for flu vaccine. Reminded to schedule her eye exam.  Depression/ anxiety -   PHQ 9 score down to 7 and GAD 7 score down to 6. It is improved with still some mild persistent symptoms. Continue with fluoxetine 60 mg daily.  BMI 43-we spent a fair amount of time discussing strategies around dietary changes and getting more active. Encouraged her to start a 10 minute a day walking program in addition to cutting back on portion sizes of carbohydrate sweets etc. She artery drinks water mostly which is fantastic. I asked her to try to lose at least 3 pounds by the time I see her back in 3 months think that would be a reasonable goal.  Encouraged her to schedule her mammogram.

## 2017-04-28 NOTE — Telephone Encounter (Signed)
lvm informing pt of recommendations and advice. Laureen Ochs, Viann Shove

## 2017-05-08 ENCOUNTER — Other Ambulatory Visit: Payer: Self-pay | Admitting: Family Medicine

## 2017-05-08 DIAGNOSIS — E119 Type 2 diabetes mellitus without complications: Secondary | ICD-10-CM

## 2017-05-08 NOTE — Progress Notes (Signed)
Pt advised. She is not taking a multivitamin, but will start. She would like to have her B12 checked, lab order placed.

## 2017-05-08 NOTE — Progress Notes (Signed)
I would not recommend necessarily taking extra B12.  If she Artie takes a multivitamin it should have the daily recommended amount of B12 which should be adequate.  And we can always check her level to make sure that she is not low.

## 2017-05-08 NOTE — Progress Notes (Signed)
Attempted to contact Pt, she will not be home until after 2:30 pm. Will call back.

## 2017-05-08 NOTE — Progress Notes (Signed)
Pt requested a referral for nutritional therapy for her DM management. Referral placed, she would prefer to stay in the [place] location.   Pt also questioned her metformin- she read it can cause her to have low B12. Wants to know if she should start taking a supplement. Pt does not currently take a multivitamin.

## 2017-05-09 ENCOUNTER — Ambulatory Visit (INDEPENDENT_AMBULATORY_CARE_PROVIDER_SITE_OTHER): Payer: Medicare HMO

## 2017-05-09 DIAGNOSIS — R7989 Other specified abnormal findings of blood chemistry: Secondary | ICD-10-CM | POA: Diagnosis not present

## 2017-05-09 DIAGNOSIS — Z1231 Encounter for screening mammogram for malignant neoplasm of breast: Secondary | ICD-10-CM | POA: Diagnosis not present

## 2017-05-09 DIAGNOSIS — E119 Type 2 diabetes mellitus without complications: Secondary | ICD-10-CM | POA: Diagnosis not present

## 2017-05-10 LAB — VITAMIN B12: Vitamin B-12: 1462 pg/mL — ABNORMAL HIGH (ref 200–1100)

## 2017-05-19 DIAGNOSIS — Z794 Long term (current) use of insulin: Secondary | ICD-10-CM | POA: Diagnosis not present

## 2017-05-19 DIAGNOSIS — E119 Type 2 diabetes mellitus without complications: Secondary | ICD-10-CM | POA: Diagnosis not present

## 2017-05-19 DIAGNOSIS — Z6835 Body mass index (BMI) 35.0-35.9, adult: Secondary | ICD-10-CM | POA: Diagnosis not present

## 2017-05-28 ENCOUNTER — Other Ambulatory Visit: Payer: Self-pay | Admitting: Family Medicine

## 2017-07-29 ENCOUNTER — Encounter: Payer: Self-pay | Admitting: Family Medicine

## 2017-07-29 ENCOUNTER — Ambulatory Visit (INDEPENDENT_AMBULATORY_CARE_PROVIDER_SITE_OTHER): Payer: Medicare HMO | Admitting: Family Medicine

## 2017-07-29 VITALS — BP 121/62 | HR 78 | Ht 63.0 in | Wt 232.0 lb

## 2017-07-29 DIAGNOSIS — E039 Hypothyroidism, unspecified: Secondary | ICD-10-CM

## 2017-07-29 DIAGNOSIS — E119 Type 2 diabetes mellitus without complications: Secondary | ICD-10-CM | POA: Diagnosis not present

## 2017-07-29 DIAGNOSIS — Z6841 Body Mass Index (BMI) 40.0 and over, adult: Secondary | ICD-10-CM

## 2017-07-29 DIAGNOSIS — F418 Other specified anxiety disorders: Secondary | ICD-10-CM

## 2017-07-29 LAB — POCT GLYCOSYLATED HEMOGLOBIN (HGB A1C): HEMOGLOBIN A1C: 5.9

## 2017-07-29 MED ORDER — ALPRAZOLAM 0.5 MG PO TABS: 0.5000 mg | ORAL_TABLET | Freq: Every day | ORAL | 0 refills | Status: DC | PRN

## 2017-07-29 NOTE — Progress Notes (Signed)
Subjective:    Patient ID: Robin Murillo, female    DOB: [birth year], 66 y.o.   MRN: [id number]  HPI Diabetes - no hypoglycemic events. No wounds or sores that are not healing well. No increased thirst or urination. Checking glucose at home. Taking medications as prescribed without any side effects.  She was a new start on metformin and has not had any concerns or issues with the medication.  She did see nutrition since I last saw her.   Follow-up hypothyroidism-doing well on current regimen.  She has had some significant weight loss but secondary to dietary changes.  No significant changes in hair or skin.  Depression/anxiety-currently on fluoxetine 20 mg daily.  She is requesting refill on her alprazolam.  BMI 41 - she has lost 13 lbs since she was last here.  She has changed her diet for the better and has been working on it.  Review of Systems  BP 121/62   Pulse 78   Ht 5\' 3"  (1.6 m)   Wt 232 lb (105.2 kg)   SpO2 95%   BMI 41.10 kg/m     Allergies  Allergen Reactions  . Morphine And Related Swelling    Swelling in face    Past Medical History:  Diagnosis Date  . Heart disease    pacemaker/defiberlator  . Thyroid disease     Past Surgical History:  Procedure Laterality Date  . CARDIAC DEFIBRILLATOR PLACEMENT     06/2010 at [facility]  . CARDIAC ELECTROPHYSIOLOGY STUDY AND ABLATION     06/2010  . PACEMAKER PLACEMENT     06/2010 at [facility]    Social History   Socioeconomic History  . Marital status: Married    Spouse name: Maurine Minister  . Number of children: 3  . Years of education: GED  . Highest education level: Not on file  Social Needs  . Financial resource strain: Not on file  . Food insecurity - worry: Not on file  . Food insecurity - inability: Not on file  . Transportation needs - medical: Not on file  . Transportation needs - non-medical: Not on file  Occupational History  . Occupation: disability  Tobacco Use  . Smoking status: Former Smoker     Last attempt to quit: 04/04/2003    Years since quitting: 14.3  . Smokeless tobacco: Never Used  Substance and Sexual Activity  . Alcohol use: No  . Drug use: No  . Sexual activity: Not on file  Other Topics Concern  . Not on file  Social History Narrative   1 caffeine dirink per day. No regular exercise.     Family History  Problem Relation Age of Onset  . Heart attack Brother   . Heart disease Sister        Murmur  . Thyroid disease Sister   . Diabetes Brother     Outpatient Encounter Medications as of 07/29/2017  Medication Sig  . albuterol (VENTOLIN HFA) 108 (90 Base) MCG/ACT inhaler Inhale 2 puffs into the lungs every 6 (six) hours as needed for wheezing or shortness of breath.  . ALPRAZolam (XANAX) 0.5 MG tablet Take 1 tablet (0.5 mg total) by mouth daily as needed for anxiety.  . AMBULATORY NON FORMULARY MEDICATION Need download from CPAP for last 2 weeks to 30 days. Fax to [employer].  Fax results to [phone].  Marland Kitchen aspirin 81 MG tablet Take 81 mg by mouth daily.  . Cholecalciferol (VITAMIN D PO) Take 2,000 Int'l Units by mouth  daily.   Marland Kitchen. FLUoxetine (PROZAC) 20 MG capsule Take 3 capsules (60 mg total) daily by mouth.  . Fluticasone-Salmeterol (ADVAIR) 100-50 MCG/DOSE AEPB Inhale 1 puff into the lungs every 12 (twelve) hours.  Marland Kitchen. levothyroxine (SYNTHROID, LEVOTHROID) 125 MCG tablet TAKE 1 TABLET EVERY DAY  . magnesium oxide (MAG-OX) 400 MG tablet Take 400 mg by mouth 2 (two) times daily.  . metFORMIN (GLUCOPHAGE-XR) 500 MG 24 hr tablet Take 1 tablet (500 mg total) by mouth daily with breakfast.  . metoprolol (LOPRESSOR) 50 MG tablet Take 50 mg by mouth 2 (two) times daily.  Marland Kitchen. tolterodine (DETROL) 1 MG tablet Take 1 mg by mouth 2 (two) times daily.  . [DISCONTINUED] ALPRAZolam (XANAX) 0.5 MG tablet Take 1 tablet (0.5 mg total) by mouth daily as needed for anxiety.   No facility-administered encounter medications on file as of 07/29/2017.          Objective:   Physical  Exam  Constitutional: She is oriented to person, place, and time. She appears well-developed and well-nourished.  HENT:  Head: Normocephalic and atraumatic.  Cardiovascular: Normal rate, regular rhythm and normal heart sounds.  Pulmonary/Chest: Effort normal and breath sounds normal.  Neurological: She is alert and oriented to person, place, and time.  Skin: Skin is warm and dry.  Psychiatric: She has a normal mood and affect. Her behavior is normal.          Assessment & Plan:  DM - Well controlled. Continue current regimen. Follow up in  4 months. A1C of 5.9.  Looks absolutely fantastic.  She is really made significant strides in dietary changes eating regularly, she did meet with a nutritionist once but did encourage her to go back and meet again.  Just encouraged her to keep working at it and hoping we can make some significant progress.  Hypothyroidism -due to recheck TSH.  Adjust medication if needed.  Depression/anxiety-PHQ 9 score of 6 today.  She is happy with her current regimen with the fluoxetine.  She really does not want to make any changes today though there certainly are a little room for improvement.  She is hoping that we will get better in the next couple of months.  BMI 41- congratulated her on the 13 lbs weight loss.  Encouraged her to keep it up.  Hopefully she can lose another 5-8 pounds the next time I see her.

## 2017-08-12 DIAGNOSIS — E119 Type 2 diabetes mellitus without complications: Secondary | ICD-10-CM | POA: Diagnosis not present

## 2017-08-12 DIAGNOSIS — E039 Hypothyroidism, unspecified: Secondary | ICD-10-CM | POA: Diagnosis not present

## 2017-08-12 LAB — COMPLETE METABOLIC PANEL WITH GFR
AG Ratio: 1.1 (calc) (ref 1.0–2.5)
ALKALINE PHOSPHATASE (APISO): 195 U/L — AB (ref 33–130)
ALT: 17 U/L (ref 6–29)
AST: 19 U/L (ref 10–35)
Albumin: 3.9 g/dL (ref 3.6–5.1)
BUN: 12 mg/dL (ref 7–25)
CO2: 25 mmol/L (ref 20–32)
CREATININE: 0.89 mg/dL (ref 0.50–0.99)
Calcium: 9.5 mg/dL (ref 8.6–10.4)
Chloride: 105 mmol/L (ref 98–110)
GFR, EST NON AFRICAN AMERICAN: 68 mL/min/{1.73_m2} (ref >=60)
GFR, Est African American: 78 mL/min/{1.73_m2} (ref >=60)
Globulin: 3.5 g/dL (calc) (ref 1.9–3.7)
Glucose, Bld: 104 mg/dL — ABNORMAL HIGH (ref 65–99)
POTASSIUM: 4.7 mmol/L (ref 3.5–5.3)
Sodium: 138 mmol/L (ref 135–146)
Total Bilirubin: 0.5 mg/dL (ref 0.2–1.2)
Total Protein: 7.4 g/dL (ref 6.1–8.1)

## 2017-08-12 LAB — LIPID PANEL
CHOLESTEROL: 169 mg/dL (ref <200)
HDL: 48 mg/dL — AB (ref >50)
LDL Cholesterol (Calc): 103 mg/dL (calc) — ABNORMAL HIGH
NON-HDL CHOLESTEROL (CALC): 121 mg/dL (ref <130)
TRIGLYCERIDES: 90 mg/dL (ref <150)
Total CHOL/HDL Ratio: 3.5 (calc) (ref <5.0)

## 2017-08-12 LAB — TSH: TSH: 1.42 mIU/L (ref 0.40–4.50)

## 2017-11-28 DIAGNOSIS — I472 Ventricular tachycardia, unspecified: Secondary | ICD-10-CM | POA: Insufficient documentation

## 2017-12-01 ENCOUNTER — Ambulatory Visit (INDEPENDENT_AMBULATORY_CARE_PROVIDER_SITE_OTHER): Payer: Medicare HMO | Admitting: Family Medicine

## 2017-12-01 ENCOUNTER — Encounter: Payer: Self-pay | Admitting: Family Medicine

## 2017-12-01 VITALS — BP 116/50 | HR 74 | Ht 63.0 in | Wt 222.0 lb

## 2017-12-01 DIAGNOSIS — Z794 Long term (current) use of insulin: Secondary | ICD-10-CM | POA: Diagnosis not present

## 2017-12-01 DIAGNOSIS — E1165 Type 2 diabetes mellitus with hyperglycemia: Secondary | ICD-10-CM | POA: Diagnosis not present

## 2017-12-01 DIAGNOSIS — J069 Acute upper respiratory infection, unspecified: Secondary | ICD-10-CM

## 2017-12-01 DIAGNOSIS — J449 Chronic obstructive pulmonary disease, unspecified: Secondary | ICD-10-CM | POA: Diagnosis not present

## 2017-12-01 LAB — POCT GLYCOSYLATED HEMOGLOBIN (HGB A1C): HEMOGLOBIN A1C: 5.6

## 2017-12-01 NOTE — Progress Notes (Signed)
Subjective:    CC: DM  HPI:  Diabetes - no hypoglycemic events. No wounds or sores that are not healing well. No increased thirst or urination. Checking glucose at home. Taking medications as prescribed without any side effects.  F/U COPD -he is currently on Advair.  She has had some cold symptoms over the last week with some sinus congestion a little bit of cough.  No increased shortness of breath.  No fevers chills or sweats.  She does feel little better after the weekend.   BMI 39 - she is doing well.  She has lost 10 lbs. she is doing great and has steadily worked on her diet.  She also bought a small pedal cycle that she uses while she watches TV.     Past medical history, Surgical history, Family history not pertinant except as noted below, Social history, Allergies, and medications have been entered into the medical record, reviewed, and corrections made.   Review of Systems: No fevers, chills, night sweats, weight loss, chest pain, or shortness of breath.   Objective:    General: Well Developed, well nourished, and in no acute distress.  Neuro: Alert and oriented x3, extra-ocular muscles intact, sensation grossly intact.  HEENT: Normocephalic, atraumatic oropharynx is clear, TMs and canals are clear bilaterally.  No significant cervical lymphadenopathy. Skin: Warm and dry, no rashes. Cardiac: Regular rate and rhythm, no murmurs rubs or gallops, no lower extremity edema.  Respiratory: Clear to auscultation bilaterally. Not using accessory muscles, speaking in full sentences.   Impression and Recommendations:    DM - Well controlled. Continue current regimen. Follow up in 4 months.  If A1c looks fantastic next time we might be able to discontinue the metformin.  BMI 39 - doing well. Continue to work on healthy diet and increased activity. She is down 10 lbs.    COPD- Stable. Recent URI but she is starting to feel better.    URI - likely viral. Call if not continuing to  feel better later this week.

## 2018-01-02 ENCOUNTER — Other Ambulatory Visit: Payer: Self-pay | Admitting: Family Medicine

## 2018-01-20 DIAGNOSIS — I472 Ventricular tachycardia: Secondary | ICD-10-CM | POA: Diagnosis not present

## 2018-01-20 DIAGNOSIS — Z95810 Presence of automatic (implantable) cardiac defibrillator: Secondary | ICD-10-CM | POA: Diagnosis not present

## 2018-01-20 DIAGNOSIS — I4581 Long QT syndrome: Secondary | ICD-10-CM | POA: Diagnosis not present

## 2018-03-04 ENCOUNTER — Telehealth: Payer: Self-pay | Admitting: Family Medicine

## 2018-03-04 NOTE — Telephone Encounter (Signed)
Call pt: because of her diabetes she reallhy needs to be on a stain to lower her risk of heart attack and stroke. Pleae see if she woiuld consider taking it.

## 2018-03-05 MED ORDER — ATORVASTATIN CALCIUM 20 MG PO TABS: 20.0000 mg | ORAL_TABLET | Freq: Every day | ORAL | 3 refills | Status: DC

## 2018-03-05 NOTE — Telephone Encounter (Signed)
Scription sent for atorvastatin.

## 2018-03-05 NOTE — Telephone Encounter (Signed)
Called patient and she is ok with you sending in a statin to her pharmacy. [facility] on [address] in [place]. KG LPN

## 2018-04-02 ENCOUNTER — Ambulatory Visit (INDEPENDENT_AMBULATORY_CARE_PROVIDER_SITE_OTHER): Payer: Medicare HMO | Admitting: Family Medicine

## 2018-04-02 ENCOUNTER — Encounter: Payer: Self-pay | Admitting: Family Medicine

## 2018-04-02 VITALS — BP 125/62 | HR 69 | Ht 63.0 in

## 2018-04-02 DIAGNOSIS — R809 Proteinuria, unspecified: Secondary | ICD-10-CM | POA: Diagnosis not present

## 2018-04-02 DIAGNOSIS — J449 Chronic obstructive pulmonary disease, unspecified: Secondary | ICD-10-CM | POA: Diagnosis not present

## 2018-04-02 DIAGNOSIS — E1129 Type 2 diabetes mellitus with other diabetic kidney complication: Secondary | ICD-10-CM

## 2018-04-02 DIAGNOSIS — E039 Hypothyroidism, unspecified: Secondary | ICD-10-CM

## 2018-04-02 DIAGNOSIS — Z780 Asymptomatic menopausal state: Secondary | ICD-10-CM

## 2018-04-02 DIAGNOSIS — N3281 Overactive bladder: Secondary | ICD-10-CM | POA: Diagnosis not present

## 2018-04-02 DIAGNOSIS — Z23 Encounter for immunization: Secondary | ICD-10-CM

## 2018-04-02 DIAGNOSIS — E119 Type 2 diabetes mellitus without complications: Secondary | ICD-10-CM | POA: Diagnosis not present

## 2018-04-02 LAB — POCT GLYCOSYLATED HEMOGLOBIN (HGB A1C): HEMOGLOBIN A1C: 5.7 % — AB (ref 4.0–5.6)

## 2018-04-02 MED ORDER — TOLTERODINE TARTRATE 1 MG PO TABS: 1.0000 mg | ORAL_TABLET | Freq: Two times a day (BID) | ORAL | 1 refills | Status: DC

## 2018-04-02 NOTE — Progress Notes (Signed)
Subjective:    CC:   HPI: Diabetes - no hypoglycemic events. No wounds or sores that are not healing well. No increased thirst or urination. Checking glucose at home. Taking medications as prescribed without any side effects.  F/U COPD - [place]. Using her albuterol every other day. No recent flares.   C/O of right ear pain.  Says feels like something in there. She has been scratching at it.  Fevers or chills.  But she has had some sinus pressure recently.  She will notice it more when she bends over she thinks is just fall allergies but she is not really taking any medication for it.  Hypothyroidism - Taking medication regularly in the AM away from food and vitamins, etc. No recent change to skin, hair, or energy levels.  Past medical history, Surgical history, Family history not pertinant except as noted below, Social history, Allergies, and medications have been entered into the medical record, reviewed, and corrections made.   Review of Systems: No fevers, chills, night sweats, weight loss, chest pain, or shortness of breath.   Objective:    General: Well Developed, well nourished, and in no acute distress.  Neuro: Alert and oriented x3, extra-ocular muscles intact, sensation grossly intact.  HEENT: Normocephalic, atraumatic oropharynx is clear, right TM is normal with no sign of fluid.  The canal just has some dry skin with some scabbing from scratching and excoriation. Skin: Warm and dry, no rashes. Cardiac: Regular rate and rhythm, no murmurs rubs or gallops, no lower extremity edema.  Respiratory: Clear to auscultation bilaterally. Not using accessory muscles, speaking in full sentences.   Impression and Recommendations:    DM - foot exam performed today.  Will control.  Hemoglobin A1c of 5.7 today.  Continue current regimen.  Follow-up in 4 months.  Microalbuminuria associated with diabetes-  COPD -stable.  No recent flares.  Really just using her albuterol every other day  instead of being on a controller like Advair.  Mostly because of cost.  Elevated alkaline phosphatase-in 2 continue to monitor this carefully.  Hypothyroidism-due to recheck TSH.  Right ear pain-it really just looks like she has some dry patches in her ear recommend a trial of topical hydrocortisone and try to avoid scratching at the ear.  OAB -requesting a refill on her Detrol today.

## 2018-04-06 ENCOUNTER — Other Ambulatory Visit: Payer: Self-pay | Admitting: Family Medicine

## 2018-04-06 ENCOUNTER — Telehealth: Payer: Self-pay

## 2018-04-06 DIAGNOSIS — Z1239 Encounter for other screening for malignant neoplasm of breast: Secondary | ICD-10-CM

## 2018-04-06 MED ORDER — OXYBUTYNIN CHLORIDE 5 MG PO TABS: 5.0000 mg | ORAL_TABLET | Freq: Two times a day (BID) | ORAL | 3 refills | Status: DC

## 2018-04-06 NOTE — Telephone Encounter (Signed)
Denis advised.

## 2018-04-06 NOTE — Telephone Encounter (Signed)
Pt's husband called stating that he tried to get the Tolterodine RX, but it was going to be over $400 and with a good RX it would still cost about $140.   Pt husband wants to know if mediation could be switched to something like Oxybutynin which is cheaper for him with [facility].  If ok to switch, requesting a paper RX be printed that he can come pick up and take to local pharmacy with best price.  Thanks!

## 2018-04-06 NOTE — Telephone Encounter (Signed)
New RX sent for oxybutynin printed. Pt ot pick up.  Only did a 30-day so that she can try it first and we can make sure that she is going to tolerate it well and not get symptoms including dry mouth etc. before we change to a 90-day.

## 2018-04-07 LAB — TSH: TSH: 0.7 mIU/L (ref 0.40–4.50)

## 2018-04-07 LAB — COMPLETE METABOLIC PANEL WITH GFR
AG Ratio: 1.3 (calc) (ref 1.0–2.5)
ALBUMIN MSPROF: 3.9 g/dL (ref 3.6–5.1)
ALT: 18 U/L (ref 6–29)
AST: 20 U/L (ref 10–35)
Alkaline phosphatase (APISO): 254 U/L — ABNORMAL HIGH (ref 33–130)
BILIRUBIN TOTAL: 0.5 mg/dL (ref 0.2–1.2)
BUN: 9 mg/dL (ref 7–25)
CHLORIDE: 106 mmol/L (ref 98–110)
CO2: 21 mmol/L (ref 20–32)
Calcium: 9.3 mg/dL (ref 8.6–10.4)
Creat: 0.82 mg/dL (ref 0.50–0.99)
GFR, EST AFRICAN AMERICAN: 86 mL/min/{1.73_m2} (ref >=60)
GFR, EST NON AFRICAN AMERICAN: 74 mL/min/{1.73_m2} (ref >=60)
GLUCOSE: 95 mg/dL (ref 65–99)
Globulin: 2.9 g/dL (calc) (ref 1.9–3.7)
Potassium: 4.7 mmol/L (ref 3.5–5.3)
SODIUM: 138 mmol/L (ref 135–146)
Total Protein: 6.8 g/dL (ref 6.1–8.1)

## 2018-04-07 LAB — GAMMA GT: GGT: 27 U/L (ref 3–65)

## 2018-04-15 ENCOUNTER — Other Ambulatory Visit: Payer: Self-pay | Admitting: *Deleted

## 2018-04-15 DIAGNOSIS — E559 Vitamin D deficiency, unspecified: Secondary | ICD-10-CM

## 2018-04-15 DIAGNOSIS — R748 Abnormal levels of other serum enzymes: Secondary | ICD-10-CM

## 2018-04-15 DIAGNOSIS — E039 Hypothyroidism, unspecified: Secondary | ICD-10-CM

## 2018-04-20 DIAGNOSIS — I472 Ventricular tachycardia: Secondary | ICD-10-CM | POA: Diagnosis not present

## 2018-04-21 DIAGNOSIS — E039 Hypothyroidism, unspecified: Secondary | ICD-10-CM | POA: Diagnosis not present

## 2018-04-21 DIAGNOSIS — E559 Vitamin D deficiency, unspecified: Secondary | ICD-10-CM | POA: Diagnosis not present

## 2018-04-21 DIAGNOSIS — R748 Abnormal levels of other serum enzymes: Secondary | ICD-10-CM | POA: Diagnosis not present

## 2018-04-22 LAB — VITAMIN D 25 HYDROXY (VIT D DEFICIENCY, FRACTURES): Vit D, 25-Hydroxy: 33 ng/mL (ref 30–100)

## 2018-04-22 LAB — CBC
HCT: 43.8 % (ref 35.0–45.0)
Hemoglobin: 14.3 g/dL (ref 11.7–15.5)
MCH: 29.6 pg (ref 27.0–33.0)
MCHC: 32.6 g/dL (ref 32.0–36.0)
MCV: 90.7 fL (ref 80.0–100.0)
MPV: 10.9 fL (ref 7.5–12.5)
PLATELETS: 313 10*3/uL (ref 140–400)
RBC: 4.83 10*6/uL (ref 3.80–5.10)
RDW: 12.4 % (ref 11.0–15.0)
WBC: 7.8 10*3/uL (ref 3.8–10.8)

## 2018-04-22 LAB — PARATHYROID HORMONE, INTACT (NO CA): PTH: 67 pg/mL — AB (ref 14–64)

## 2018-05-13 ENCOUNTER — Ambulatory Visit (INDEPENDENT_AMBULATORY_CARE_PROVIDER_SITE_OTHER): Payer: Medicare HMO

## 2018-05-13 DIAGNOSIS — M85852 Other specified disorders of bone density and structure, left thigh: Secondary | ICD-10-CM

## 2018-05-13 DIAGNOSIS — Z780 Asymptomatic menopausal state: Secondary | ICD-10-CM

## 2018-05-13 DIAGNOSIS — Z1231 Encounter for screening mammogram for malignant neoplasm of breast: Secondary | ICD-10-CM | POA: Diagnosis not present

## 2018-05-13 DIAGNOSIS — Z1239 Encounter for other screening for malignant neoplasm of breast: Secondary | ICD-10-CM

## 2018-05-14 ENCOUNTER — Encounter: Payer: Self-pay | Admitting: Family Medicine

## 2018-05-14 DIAGNOSIS — M8580 Other specified disorders of bone density and structure, unspecified site: Secondary | ICD-10-CM | POA: Insufficient documentation

## 2018-05-22 ENCOUNTER — Other Ambulatory Visit: Payer: Self-pay | Admitting: Family Medicine

## 2018-07-12 ENCOUNTER — Other Ambulatory Visit: Payer: Self-pay | Admitting: Family Medicine

## 2018-07-20 DIAGNOSIS — Z4502 Encounter for adjustment and management of automatic implantable cardiac defibrillator: Secondary | ICD-10-CM | POA: Diagnosis not present

## 2018-07-30 ENCOUNTER — Ambulatory Visit: Payer: Medicare HMO | Admitting: Family Medicine

## 2018-07-30 DIAGNOSIS — J449 Chronic obstructive pulmonary disease, unspecified: Secondary | ICD-10-CM | POA: Diagnosis not present

## 2018-07-30 DIAGNOSIS — Z95810 Presence of automatic (implantable) cardiac defibrillator: Secondary | ICD-10-CM | POA: Diagnosis not present

## 2018-07-30 DIAGNOSIS — I4581 Long QT syndrome: Secondary | ICD-10-CM | POA: Diagnosis not present

## 2018-07-30 DIAGNOSIS — I472 Ventricular tachycardia: Secondary | ICD-10-CM | POA: Diagnosis not present

## 2018-11-02 DIAGNOSIS — I472 Ventricular tachycardia: Secondary | ICD-10-CM | POA: Diagnosis not present

## 2018-11-17 ENCOUNTER — Other Ambulatory Visit: Payer: Self-pay | Admitting: Family Medicine

## 2019-01-22 DIAGNOSIS — I4581 Long QT syndrome: Secondary | ICD-10-CM | POA: Diagnosis not present

## 2019-01-22 DIAGNOSIS — I472 Ventricular tachycardia: Secondary | ICD-10-CM | POA: Diagnosis not present

## 2019-01-22 DIAGNOSIS — Z4502 Encounter for adjustment and management of automatic implantable cardiac defibrillator: Secondary | ICD-10-CM | POA: Diagnosis not present

## 2019-01-22 DIAGNOSIS — Z95810 Presence of automatic (implantable) cardiac defibrillator: Secondary | ICD-10-CM | POA: Diagnosis not present

## 2019-02-21 ENCOUNTER — Other Ambulatory Visit: Payer: Self-pay | Admitting: Family Medicine

## 2019-03-01 DIAGNOSIS — I472 Ventricular tachycardia: Secondary | ICD-10-CM | POA: Diagnosis not present

## 2019-04-15 ENCOUNTER — Other Ambulatory Visit: Payer: Self-pay | Admitting: Family Medicine

## 2019-04-15 DIAGNOSIS — Z1231 Encounter for screening mammogram for malignant neoplasm of breast: Secondary | ICD-10-CM

## 2019-05-03 NOTE — Progress Notes (Deleted)
Subjective:   Robin Murillo is a 68 y.o. female who presents for Medicare Annual (Subsequent) preventive examination.  Review of Systems:  No ROS.  Medicare Wellness Virtual Visit.  Visual/audio telehealth visit, UTA vital signs.   See social history for additional risk factors.      Sleep patterns:    Home Safety/Smoke Alarms: Feels safe in home. Smoke alarms in place.  Living environment;  Seat Belt Safety/Bike Helmet: Wears seat belt.   Female:   Pap- Aged out      Mammo-  Scheduled for 05/19/19     Dexa scan- UTD       CCS-     Objective:     Vitals: There were no vitals taken for this visit.  There is no height or weight on file to calculate BMI.  Advanced Directives 10/12/2014  Does Patient Have a Medical Advance Directive? No  Would patient like information on creating a medical advance directive? Yes - Transport plannerducational materials given    Tobacco Social History   Tobacco Use  Smoking Status Former Smoker  . Quit date: 04/04/2003  . Years since quitting: 16.0  Smokeless Tobacco Never Used     Counseling given: Not Answered   Clinical Intake:                       Past Medical History:  Diagnosis Date  . Heart disease    pacemaker/defiberlator  . Thyroid disease    Past Surgical History:  Procedure Laterality Date  . [facility]  . CARDIAC ELECTROPHYSIOLOGY STUDY AND ABLATION     06/2010  . PACEMAKER PLACEMENT     [facility]   Family History  Problem Relation Age of Onset  . Heart attack Brother   . Heart disease Sister        Murmur  . Thyroid disease Sister   . Diabetes Brother    Social History   Socioeconomic History  . Marital status: Married    Spouse name: Maurine MinisterDennis  . Number of children: 3  . Years of education: GED  . Highest education level: Not on file  Occupational History  . Occupation: disability  Social Needs  . Financial resource strain: Not on file   . Food insecurity    Worry: Not on file    Inability: Not on file  . Transportation needs    Medical: Not on file    Non-medical: Not on file  Tobacco Use  . Smoking status: Former Smoker    Quit date: 04/04/2003    Years since quitting: 16.0  . Smokeless tobacco: Never Used  Substance and Sexual Activity  . Alcohol use: No  . Drug use: No  . Sexual activity: Not on file  Lifestyle  . Physical activity    Days per week: Not on file    Minutes per session: Not on file  . Stress: Not on file  Relationships  . Social Musicianconnections    Talks on phone: Not on file    Gets together: Not on file    Attends religious service: Not on file    Active member of club or organization: Not on file    Attends meetings of clubs or organizations: Not on file    Relationship status: Not on file  Other Topics Concern  . Not on file  Social History Narrative   1 caffeine dirink per day. No regular  exercise.     Outpatient Encounter Medications as of 05/04/2019  Medication Sig  . albuterol (VENTOLIN HFA) 108 (90 Base) MCG/ACT inhaler Inhale 2 puffs into the lungs every 6 (six) hours as needed for wheezing or shortness of breath.  . ALPRAZolam (XANAX) 0.5 MG tablet Take 1 tablet (0.5 mg total) by mouth daily as needed for anxiety.  . AMBULATORY NON FORMULARY MEDICATION Need download from CPAP for last 2 weeks to 30 days. Fax to [employer].  Fax results to [phone].  Marland Kitchen aspirin 81 MG tablet Take 81 mg by mouth daily.  Marland Kitchen atorvastatin (LIPITOR) 20 MG tablet TAKE 1 TABLET(20 MG) BY MOUTH DAILY  . Cholecalciferol (VITAMIN D PO) Take 2,000 Int'l Units by mouth daily.   Marland Kitchen FLUoxetine (PROZAC) 20 MG capsule TAKE 3 CAPSULES EVERY DAY  . levothyroxine (SYNTHROID) 125 MCG tablet TAKE 1 TABLET EVERY DAY  . magnesium oxide (MAG-OX) 400 MG tablet Take 400 mg by mouth 2 (two) times daily.  . metFORMIN (GLUCOPHAGE-XR) 500 MG 24 hr tablet TAKE 1 TABLET BY MOUTH EVERY DAY WITH BREAKFAST  . metoprolol (LOPRESSOR) 50  MG tablet Take 50 mg by mouth 2 (two) times daily.  Marland Kitchen oxybutynin (DITROPAN) 5 MG tablet Take 1 tablet (5 mg total) by mouth 2 (two) times daily.   No facility-administered encounter medications on file as of 05/04/2019.     Activities of Daily Living No flowsheet data found.  Patient Care Team: Hali Marry, MD as PCP - General (Family Medicine) Dionne Milo, MD as Referring Physician (Pulmonary Disease) Bonney Aid, MD as Referring Physician (Cardiology)    Assessment:   This is a routine wellness examination for [place].Physical assessment deferred to PCP.   Exercise Activities and Dietary recommendations   Diet  Breakfast: Lunch:  Dinner:       Goals   None     Fall Risk Fall Risk  04/02/2018 04/24/2017 12/05/2015 10/12/2014 09/21/2013  Falls in the past year? Yes No Yes Yes Yes  Number falls in past yr: 2 or more - 1 1 1   Injury with Fall? Yes - No No Yes  Risk Factor Category  High Fall Risk - - - -  Risk for fall due to : Impaired balance/gait - Impaired mobility Impaired balance/gait -  Follow up Falls prevention discussed - Education provided Education provided -   Is the patient's home free of loose throw rugs in walkways, pet beds, electrical cords, etc?   {Blank single:19197::"yes","no"}      Grab bars in the bathroom? {Blank single:19197::"yes","no"}      Handrails on the stairs?   {Blank single:19197::"yes","no"}      Adequate lighting?   {Blank single:19197::"yes","no"}   Depression Screen PHQ 2/9 Scores 04/02/2018 07/29/2017 04/24/2017 01/21/2017  PHQ - 2 Score 2 2 2 2   PHQ- 9 Score 5 6 7 12      Cognitive Function        Immunization History  Administered Date(s) Administered  . Influenza, High Dose Seasonal PF 04/24/2017, 04/02/2018  . Influenza,inj,Quad PF,6+ Mos 04/27/2014, 05/05/2015, 04/19/2016  . Influenza-Unspecified 04/08/2013  . Pneumococcal Conjugate-13 01/15/2016  . Pneumococcal Polysaccharide-23 04/21/2013  . Td  05/10/2008  . Tdap 05/10/2008  . Zoster 10/01/2012    Screening Tests Health Maintenance  Topic Date Due  . OPHTHALMOLOGY EXAM  11/21/2015  . PNA vac Low Risk Adult (2 of 2 - PPSV23) 04/21/2018  . URINE MICROALBUMIN  04/24/2018  . TETANUS/TDAP  05/10/2018  . Fecal DNA (Cologuard)  06/29/2018  . HEMOGLOBIN A1C  10/01/2018  . INFLUENZA VACCINE  02/13/2019  . FOOT EXAM  04/03/2019  . MAMMOGRAM  05/13/2020  . DEXA SCAN  05/13/2020  . Hepatitis C Screening  Completed        Plan:   ***   I have personally reviewed and noted the following in the patient's chart:   . Medical and social history . Use of alcohol, tobacco or illicit drugs  . Current medications and supplements . Functional ability and status . Nutritional status . Physical activity . Advanced directives . List of other physicians . Hospitalizations, surgeries, and ER visits in previous 12 months . Vitals . Screenings to include cognitive, depression, and falls . Referrals and appointments  In addition, I have reviewed and discussed with patient certain preventive protocols, quality metrics, and best practice recommendations. A written personalized care plan for preventive services as well as general preventive health recommendations were provided to patient.     Normand Sloop, LPN  16/04/9603

## 2019-05-04 ENCOUNTER — Ambulatory Visit (INDEPENDENT_AMBULATORY_CARE_PROVIDER_SITE_OTHER): Payer: Medicare HMO | Admitting: Family Medicine

## 2019-05-04 ENCOUNTER — Ambulatory Visit: Payer: Medicare HMO

## 2019-05-04 DIAGNOSIS — Z23 Encounter for immunization: Secondary | ICD-10-CM

## 2019-05-11 NOTE — Progress Notes (Signed)
Subjective:   Robin Murillo is a 68 y.o. female who presents for Medicare Annual (Subsequent) preventive examination.  Review of Systems:  No ROS.  Medicare Wellness Virtual Visit.  Visual/audio telehealth visit, UTA vital signs.   See social history for additional risk factors.    Cardiac Risk Factors include: advanced age (>4men, >37 women);obesity (BMI >30kg/m2);diabetes mellitus;sedentary lifestyle;Other (see comment), Risk factor comments: COPD Sleep patterns: Getting 7 hours of sleep a night. Wakes up 2 times to void during the night. Wakes up and feels sluggish but gets up and gets ready for the day.   Home Safety/Smoke Alarms: Feels safe in home. Smoke alarms in place.  Living environment Lives with husband in a 1 story home, no steps in or around the home. Shower is a step over tub combo with chair in place as well as anti slip mat. Seat Belt Safety/Bike Helmet: Wears seat belt.   Female:   Pap-  Aged out     Mammo-  Scheduled for 05/19/2019     Dexa scan- UTD       CCS- Aged out      Objective:     Vitals: BP (!) 142/76   Pulse 72   Temp (!) 96.4 F (35.8 C) (Oral)   Ht  (1.6 m)   Wt 240 lb (108.9 kg)   SpO2 97%   BMI 42.51 kg/m   Body mass index is 42.51 kg/m.  Advanced Directives 05/12/2019 10/12/2014  Does Patient Have a Medical Advance Directive? No No  Would patient like information on creating a medical advance directive? No - Patient declined Yes - Transport planner given    Tobacco Social History   Tobacco Use  Smoking Status Former Smoker  . Quit date: 04/04/2003  . Years since quitting: 16.1  Smokeless Tobacco Never Used     Counseling given: No   Clinical Intake:  Pre-visit preparation completed: Yes  Pain : No/denies pain Pain Score: 0-No pain     Nutritional Risks: None Diabetes: Yes CBG done?: No Did pt. bring in CBG monitor from home?: No  How often do you need to have someone help you when you read instructions,  pamphlets, or other written materials from your doctor or pharmacy?: 1 - Never What is the last grade level you completed in school?: 12  Interpreter Needed?: No  Information entered by :: Carolin Sicks, LPN  Past Medical History:  Diagnosis Date  . Heart disease    pacemaker/defiberlator  . Thyroid disease    Past Surgical History:  Procedure Laterality Date  . CARDIAC DEFIBRILLATOR PLACEMENT     06/2010 at [facility]  . CARDIAC ELECTROPHYSIOLOGY STUDY AND ABLATION     06/2010  . PACEMAKER PLACEMENT     06/2010 at [facility]   Family History  Problem Relation Age of Onset  . Heart attack Brother   . Heart disease Sister        Murmur  . Thyroid disease Sister   . Diabetes Brother    Social History   Socioeconomic History  . Marital status: Married    Spouse name: Maurine Minister  . Number of children: 1  . Years of education: 35  . Highest education level: 12th grade  Occupational History  . Occupation: disability    Comment: office work  Engineer, production  . Financial resource strain: Not hard at all  . Food insecurity    Worry: Never true    Inability: Never true  . Transportation  needs    Medical: No    Non-medical: No  Tobacco Use  . Smoking status: Former Smoker    Quit date: 04/04/2003    Years since quitting: 16.1  . Smokeless tobacco: Never Used  Substance and Sexual Activity  . Alcohol use: No  . Drug use: No  . Sexual activity: Not Currently  Lifestyle  . Physical activity    Days per week: 2 days    Minutes per session: 30 min  . Stress: Not at all  Relationships  . Social Musician on phone: Once a week    Gets together: Never    Attends religious service: Never    Active member of club or organization: No    Attends meetings of clubs or organizations: Never    Relationship status: Married  Other Topics Concern  . Not on file  Social History Narrative   1 caffeine dirink per day. No regular exercise.     Outpatient Encounter  Medications as of 05/12/2019  Medication Sig  . albuterol (VENTOLIN HFA) 108 (90 Base) MCG/ACT inhaler Inhale 2 puffs into the lungs every 6 (six) hours as needed for wheezing or shortness of breath.  . ALPRAZolam (XANAX) 0.5 MG tablet Take 1 tablet (0.5 mg total) by mouth daily as needed for anxiety.  Marland Kitchen aspirin 81 MG tablet Take 81 mg by mouth daily.  Marland Kitchen atorvastatin (LIPITOR) 20 MG tablet TAKE 1 TABLET(20 MG) BY MOUTH DAILY  . Cholecalciferol (VITAMIN D PO) Take 2,000 Int'l Units by mouth daily.   Marland Kitchen FLUoxetine (PROZAC) 20 MG capsule TAKE 3 CAPSULES EVERY DAY  . levothyroxine (SYNTHROID) 125 MCG tablet TAKE 1 TABLET EVERY DAY  . magnesium oxide (MAG-OX) 400 MG tablet Take 400 mg by mouth 2 (two) times daily.  . metFORMIN (GLUCOPHAGE-XR) 500 MG 24 hr tablet TAKE 1 TABLET BY MOUTH EVERY DAY WITH BREAKFAST  . metoprolol (LOPRESSOR) 50 MG tablet Take 50 mg by mouth 2 (two) times daily.  Marland Kitchen oxybutynin (DITROPAN) 5 MG tablet Take 1 tablet (5 mg total) by mouth 2 (two) times daily.  . AMBULATORY NON FORMULARY MEDICATION Need download from CPAP for last 2 weeks to 30 days. Fax to [employer].  Fax results to [phone]. (Patient not taking: Reported on 05/12/2019)   No facility-administered encounter medications on file as of 05/12/2019.     Activities of Daily Living In your present state of health, do you have any difficulty performing the following activities: 05/12/2019  Hearing? Y  Comment has noticed some hearing difficulty at times  Vision? N  Difficulty concentrating or making decisions? N  Walking or climbing stairs? Y  Comment walks with a cane  Dressing or bathing? N  Doing errands, shopping? N  Preparing Food and eating ? N  Using the Toilet? N  In the past six months, have you accidently leaked urine? Y  Comment wearss a panti liner  Do you have problems with loss of bowel control? N  Managing your Medications? N  Managing your Finances? N  Housekeeping or managing your  Housekeeping? N  Some recent data might be hidden    Patient Care Team: Agapito Games, MD as PCP - General (Family Medicine) Randolm Idol, MD as Referring Physician (Pulmonary Disease) Louie Bun, MD as Referring Physician (Cardiology)    Assessment:   This is a routine wellness examination for [place].Physical assessment deferred to PCP.   Exercise Activities and Dietary recommendations Current Exercise Habits: Home exercise routine,  Type of exercise: walking, Time (Minutes): 30, Frequency (Times/Week): 2, Weekly Exercise (Minutes/Week): 60, Intensity: Mild, Exercise limited by: orthopedic condition(s);respiratory conditions(s) Diet Trys to eat a healthy diet. Breakfast: cereal or oatmeal or bagel Lunch: skips  Dinner: Meat and some vegetables, spaghetti, beef stew etc.      Goals    . Weight (lb) < 200 lb (90.7 kg)     Would like to loose 30 pounds and gain more strength in her legs with her cycle that sits on the floor.       Fall Risk Fall Risk  05/12/2019 04/02/2018 04/24/2017 12/05/2015 10/12/2014  Falls in the past year? 1 Yes No Yes Yes  Number falls in past yr: 1 2 or more - 1 1  Injury with Fall? 0 Yes - No No  Risk Factor Category  - High Fall Risk - - -  Risk for fall due to : History of fall(s);Impaired balance/gait Impaired balance/gait - Impaired mobility Impaired balance/gait  Follow up Falls prevention discussed Falls prevention discussed - Education provided Education provided   Is the patient's home free of loose throw rugs in walkways, pet beds, electrical cords, etc?   yes      Grab bars in the bathroom? yes      Handrails on the stairs?   no      Adequate lighting?   yes  Depression Screen PHQ 2/9 Scores 05/12/2019 04/02/2018 07/29/2017 04/24/2017  PHQ - 2 Score 0 2 2 2   PHQ- 9 Score - 5 6 7      Cognitive Function     6CIT Screen 05/12/2019  What Year? 0 points  What month? 0 points  What time? 0 points  Count back from 20 0  points  Months in reverse 0 points  Repeat phrase 0 points  Total Score 0    Immunization History  Administered Date(s) Administered  . Fluad Quad(high Dose 65+) 05/04/2019  . Influenza, High Dose Seasonal PF 04/24/2017, 04/02/2018  . Influenza,inj,Quad PF,6+ Mos 04/27/2014, 05/05/2015, 04/19/2016  . Influenza-Unspecified 04/08/2013  . Pneumococcal Conjugate-13 01/15/2016  . Pneumococcal Polysaccharide-23 04/21/2013  . Td 05/10/2008  . Tdap 05/10/2008  . Zoster 10/01/2012    Screening Tests Health Maintenance  Topic Date Due  . OPHTHALMOLOGY EXAM  11/21/2015  . PNA vac Low Risk Adult (2 of 2 - PPSV23) 04/21/2018  . URINE MICROALBUMIN  04/24/2018  . TETANUS/TDAP  05/10/2018  . Fecal DNA (Cologuard)  06/29/2018  . HEMOGLOBIN A1C  10/01/2018  . FOOT EXAM  04/03/2019  . MAMMOGRAM  05/13/2020  . DEXA SCAN  05/13/2020  . INFLUENZA VACCINE  Completed  . Hepatitis C Screening  Completed       Plan:    Please schedule your next medicare wellness visit with me in 1 yr.  Ms. Fritcher , Thank you for taking time to come for your Medicare Wellness Visit. I appreciate your ongoing commitment to your health goals. Please review the following plan we discussed and let me know if I can assist you in the future.  Continue doing brain stimulating activities (puzzles, reading, adult coloring books, staying active) to keep memory sharp.    These are the goals we discussed: Goals    . Weight (lb) < 200 lb (90.7 kg)     Would like to loose 30 pounds and gain more strength in her legs with her cycle that sits on the floor.       This is a list of the screening recommended  for you and due dates:  Health Maintenance  Topic Date Due  . Eye exam for diabetics  11/21/2015  . Pneumonia vaccines (2 of 2 - PPSV23) 04/21/2018  . Urine Protein Check  04/24/2018  . Tetanus Vaccine  05/10/2018  . Cologuard (Stool DNA test)  06/29/2018  . Hemoglobin A1C  10/01/2018  . Complete foot exam    04/03/2019  . Mammogram  05/13/2020  . DEXA scan (bone density measurement)  05/13/2020  . Flu Shot  Completed  .  Hepatitis C: One time screening is recommended by Center for Disease Control  (CDC) for  adults born from [birth year] through 1965.   Completed      I have personally reviewed and noted the following in the patient's chart:   . Medical and social history . Use of alcohol, tobacco or illicit drugs  . Current medications and supplements . Functional ability and status . Nutritional status . Physical activity . Advanced directives . List of other physicians . Hospitalizations, surgeries, and ER visits in previous 12 months . Vitals . Screenings to include cognitive, depression, and falls . Referrals and appointments  In addition, I have reviewed and discussed with patient certain preventive protocols, quality metrics, and best practice recommendations. A written personalized care plan for preventive services as well as general preventive health recommendations were provided to patient.     Normand SloopKimberly A , LPN  16/[id number]/960410/28/2020

## 2019-05-12 ENCOUNTER — Ambulatory Visit (INDEPENDENT_AMBULATORY_CARE_PROVIDER_SITE_OTHER): Payer: Medicare HMO | Admitting: *Deleted

## 2019-05-12 VITALS — BP 142/76 | HR 72 | Temp 96.4°F | Ht 63.0 in | Wt 240.0 lb

## 2019-05-12 DIAGNOSIS — Z0000 Encounter for general adult medical examination without abnormal findings: Secondary | ICD-10-CM

## 2019-05-12 NOTE — Patient Instructions (Signed)
Please schedule your next medicare wellness visit with me in 1 yr.  Robin Murillo , Thank you for taking time to come for your Medicare Wellness Visit. I appreciate your ongoing commitment to your health goals. Please review the following plan we discussed and let me know if I can assist you in the future.  Continue doing brain stimulating activities (puzzles, reading, adult coloring books, staying active) to keep memory sharp.    These are the goals we discussed: Goals    . Weight (lb) < 200 lb (90.7 kg)     Would like to loose 30 pounds and gain more strength in her legs with her cycle that sits on the floor.

## 2019-05-17 ENCOUNTER — Ambulatory Visit: Payer: Medicare HMO | Admitting: Family Medicine

## 2019-05-17 NOTE — Progress Notes (Deleted)
Established Patient Office Visit  Subjective:  Patient ID: Robin Murillo, female    DOB: 1951/04/08  Age: 68 y.o. MRN: [id number]  CC: No chief complaint on file.   HPI Robin Murillo presents for   COPD-   Diabetes - no hypoglycemic events. No wounds or sores that are not healing well. No increased thirst or urination. Checking glucose at home. Taking medications as prescribed without any side effects.  F/U OSA -   Hypothyroidism - Taking medication regularly in the AM away from food and vitamins, etc. No recent change to skin, hair, or energy levels.  F/U OAB. -   Past Medical History:  Diagnosis Date  . Heart disease    pacemaker/defiberlator  . Thyroid disease     Past Surgical History:  Procedure Laterality Date  . CARDIAC DEFIBRILLATOR PLACEMENT     06/2010 at [facility]  . CARDIAC ELECTROPHYSIOLOGY STUDY AND ABLATION     06/2010  . PACEMAKER PLACEMENT     06/2010 at [place] History  Problem Relation Age of Onset  . Heart attack Brother   . Heart disease Sister        Murmur  . Thyroid disease Sister   . Diabetes Brother     Social History   Socioeconomic History  . Marital status: Married    Spouse name: Simona Huh  . Number of children: 1  . Years of education: 66  . Highest education level: 12th grade  Occupational History  . 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 Financial resource strain: Not hard at all  . Food insecurity    Worry: Never true    Inability: Never true  . Transportation needs    Medical: No    Non-medical: No  Tobacco Use  . Smoking status: Former Smoker    Quit date: 04/04/2003    Years since quitting: 16.1  . Smokeless tobacco: Never Used  Substance and Sexual Activity  . Alcohol use: No  . Drug use: No  . Sexual activity: Not Currently  Lifestyle  . Physical activity    Days per week: 2 days    Minutes per session: 30 min  . Stress: Not at all  Relationships  . Social  Herbalist on phone: Once a week    Gets together: Never    Attends religious service: Never    Active member of club or organization: No    Attends meetings of clubs or organizations: Never    Relationship status: Married  . Intimate partner violence    Fear of current or ex partner: No    Emotionally abused: No    Physically abused: No    Forced sexual activity: No  Other Topics Concern  . Not on file  Social History Narrative   1 caffeine dirink per day. No regular exercise.     Outpatient Medications Prior to Visit  Medication Sig Dispense Refill  . albuterol (VENTOLIN HFA) 108 (90 Base) MCG/ACT inhaler Inhale 2 puffs into the lungs every 6 (six) hours as needed for wheezing or shortness of breath. 18 g prn  . ALPRAZolam (XANAX) 0.5 MG tablet Take 1 tablet (0.5 mg total) by mouth daily as needed for anxiety. 30 tablet 0  . AMBULATORY NON FORMULARY MEDICATION Need download from CPAP for last 2 weeks to 30 days. Fax to [employer].  Fax results to [phone]. (Patient not taking: Reported on 05/12/2019) 1 Units  0  . aspirin 81 MG tablet Take 81 mg by mouth daily.    Marland Kitchen atorvastatin (LIPITOR) 20 MG tablet TAKE 1 TABLET(20 MG) BY MOUTH DAILY 90 tablet 0  . Cholecalciferol (VITAMIN D PO) Take 2,000 Int'l Units by mouth daily.     Marland Kitchen FLUoxetine (PROZAC) 20 MG capsule TAKE 3 CAPSULES EVERY DAY 270 capsule 1  . levothyroxine (SYNTHROID) 125 MCG tablet TAKE 1 TABLET EVERY DAY 90 tablet 1  . magnesium oxide (MAG-OX) 400 MG tablet Take 400 mg by mouth 2 (two) times daily.    . metFORMIN (GLUCOPHAGE-XR) 500 MG 24 hr tablet TAKE 1 TABLET BY MOUTH EVERY DAY WITH BREAKFAST 90 tablet 3  . metoprolol (LOPRESSOR) 50 MG tablet Take 50 mg by mouth 2 (two) times daily.    Marland Kitchen oxybutynin (DITROPAN) 5 MG tablet Take 1 tablet (5 mg total) by mouth 2 (two) times daily. 60 tablet 3   No facility-administered medications prior to visit.     Allergies  Allergen Reactions  . Morphine And Related  Swelling    Swelling in face    ROS Review of Systems    Objective:    Physical Exam  There were no vitals taken for this visit. Wt Readings from Last 3 Encounters:  05/12/19 240 lb (108.9 kg)  12/01/17 222 lb (100.7 kg)  07/29/17 232 lb (105.2 kg)     Health Maintenance Due  Topic Date Due  . OPHTHALMOLOGY EXAM  11/21/2015  . PNA vac Low Risk Adult (2 of 2 - PPSV23) 04/21/2018  . URINE MICROALBUMIN  04/24/2018  . TETANUS/TDAP  05/10/2018  . Fecal DNA (Cologuard)  06/29/2018  . HEMOGLOBIN A1C  10/01/2018  . FOOT EXAM  04/03/2019    There are no preventive care reminders to display for this patient.  Lab Results  Component Value Date   TSH 0.70 04/02/2018   Lab Results  Component Value Date   WBC 7.8 04/21/2018   HGB 14.3 04/21/2018   HCT 43.8 04/21/2018   MCV 90.7 04/21/2018   PLT 313 04/21/2018   Lab Results  Component Value Date   NA 138 04/02/2018   K 4.7 04/02/2018   CO2 21 04/02/2018   GLUCOSE 95 04/02/2018   BUN 9 04/02/2018   CREATININE 0.82 04/02/2018   BILITOT 0.5 04/02/2018   ALKPHOS 155 (H) 07/18/2016   AST 20 04/02/2018   ALT 18 04/02/2018   PROT 6.8 04/02/2018   ALBUMIN 3.9 07/18/2016   CALCIUM 9.3 04/02/2018   Lab Results  Component Value Date   CHOL 169 08/12/2017   Lab Results  Component Value Date   HDL 48 (L) 08/12/2017   Lab Results  Component Value Date   LDLCALC 103 (H) 08/12/2017   Lab Results  Component Value Date   TRIG 90 08/12/2017   Lab Results  Component Value Date   CHOLHDL 3.5 08/12/2017   Lab Results  Component Value Date   HGBA1C 5.7 (A) 04/02/2018      Assessment & Plan:   Problem List Items Addressed This Visit      Respiratory   Obstructive sleep apnea - Primary   COPD (chronic obstructive pulmonary disease) (HCC)     Endocrine   Microalbuminuria due to type 2 diabetes mellitus (HCC)   Hypothyroid   Diabetes type 2, controlled (HCC)     Genitourinary   OAB (overactive bladder)       No orders of the defined types were placed in this encounter.  Follow-up: No follow-ups on file.    Beatrice Lecher, MD

## 2019-05-19 ENCOUNTER — Telehealth: Payer: Self-pay

## 2019-05-19 ENCOUNTER — Ambulatory Visit (INDEPENDENT_AMBULATORY_CARE_PROVIDER_SITE_OTHER): Payer: Medicare HMO

## 2019-05-19 ENCOUNTER — Other Ambulatory Visit: Payer: Self-pay | Admitting: Family Medicine

## 2019-05-19 ENCOUNTER — Ambulatory Visit (INDEPENDENT_AMBULATORY_CARE_PROVIDER_SITE_OTHER): Payer: Medicare HMO | Admitting: Family Medicine

## 2019-05-19 ENCOUNTER — Other Ambulatory Visit: Payer: Self-pay

## 2019-05-19 ENCOUNTER — Encounter: Payer: Self-pay | Admitting: Family Medicine

## 2019-05-19 VITALS — BP 126/57 | HR 76 | Temp 98.3°F | Ht 63.0 in | Wt 240.0 lb

## 2019-05-19 DIAGNOSIS — Z1231 Encounter for screening mammogram for malignant neoplasm of breast: Secondary | ICD-10-CM | POA: Diagnosis not present

## 2019-05-19 DIAGNOSIS — R809 Proteinuria, unspecified: Secondary | ICD-10-CM

## 2019-05-19 DIAGNOSIS — E039 Hypothyroidism, unspecified: Secondary | ICD-10-CM

## 2019-05-19 DIAGNOSIS — J449 Chronic obstructive pulmonary disease, unspecified: Secondary | ICD-10-CM

## 2019-05-19 DIAGNOSIS — E119 Type 2 diabetes mellitus without complications: Secondary | ICD-10-CM | POA: Diagnosis not present

## 2019-05-19 DIAGNOSIS — J019 Acute sinusitis, unspecified: Secondary | ICD-10-CM | POA: Diagnosis not present

## 2019-05-19 DIAGNOSIS — E1129 Type 2 diabetes mellitus with other diabetic kidney complication: Secondary | ICD-10-CM

## 2019-05-19 DIAGNOSIS — E7800 Pure hypercholesterolemia, unspecified: Secondary | ICD-10-CM

## 2019-05-19 DIAGNOSIS — Z1211 Encounter for screening for malignant neoplasm of colon: Secondary | ICD-10-CM

## 2019-05-19 DIAGNOSIS — Z6841 Body Mass Index (BMI) 40.0 and over, adult: Secondary | ICD-10-CM | POA: Diagnosis not present

## 2019-05-19 LAB — POCT UA - MICROALBUMIN
Creatinine, POC: 300 mg/dL
Microalbumin Ur, POC: 80 mg/L

## 2019-05-19 LAB — POCT GLYCOSYLATED HEMOGLOBIN (HGB A1C): Hemoglobin A1C: 6.1 % — AB (ref 4.0–5.6)

## 2019-05-19 MED ORDER — AMBULATORY NON FORMULARY MEDICATION: 0 refills | Status: DC

## 2019-05-19 MED ORDER — LISINOPRIL 5 MG PO TABS: 5.0000 mg | ORAL_TABLET | Freq: Every day | ORAL | 3 refills | Status: DC

## 2019-05-19 MED ORDER — ATORVASTATIN CALCIUM 20 MG PO TABS: ORAL_TABLET | ORAL | 3 refills | Status: DC

## 2019-05-19 MED ORDER — FLUOXETINE HCL 20 MG PO CAPS: ORAL_CAPSULE | ORAL | 1 refills | Status: DC

## 2019-05-19 MED ORDER — AZITHROMYCIN 250 MG PO TABS: ORAL_TABLET | ORAL | 0 refills | Status: AC

## 2019-05-19 NOTE — Assessment & Plan Note (Signed)
Diagnosis.  Discussed the importance of starting a low-dose apex inhibitor now that she has microalbuminuria to help with renal protection.  New prescription sent to pharmacy.

## 2019-05-19 NOTE — Assessment & Plan Note (Signed)
Due to recheck TSH.  Making adjustments as needed but it sounds like she is actually doing well.

## 2019-05-19 NOTE — Telephone Encounter (Signed)
Yes please. I see the orders as well from the 2nd so not sure why they won't release

## 2019-05-19 NOTE — Assessment & Plan Note (Signed)
Stable.  No recent flares or exacerbations. 

## 2019-05-19 NOTE — Telephone Encounter (Signed)
-   Ordered Cologuard.

## 2019-05-19 NOTE — Progress Notes (Signed)
Established Patient Office Visit  Subjective:  Patient ID: Robin Murillo, female    DOB: [birth year]  Age: 68 y.o. MRN: [id number]  CC:  Chief Complaint  Patient presents with  . Diabetes  . Cough    HPI Robin Hesselbachunice Fossum presents for   Diabetes - no hypoglycemic events. No wounds or sores that are not healing well. No increased thirst or urination. Checking glucose at home. Taking medications as prescribed without any side effects.  She also c/o of HA and sinus congestion x 2 weeks and now in the last 3 days  Has started to cough. occ productive. No fever or chills. Mild ST.  No ear pain.  No known COVID exposure.  + post nasal drip.    She actually reports that she fell over a scooter last week and bruised her shins bilaterally and hit her right knee.  She says she is doing better but just wanted to let me know about it.  Hypothyroidism - Taking medication regularly in the AM away from food and vitamins, etc. No recent change to skin, hair, or energy levels.  Follow-up COPD-no recent flares or exacerbations.  She was previously being followed by Dr. Jerre SimonJavaid, but she has not seen him in almost 3 years.   Past Medical History:  Diagnosis Date  . Heart disease    pacemaker/defiberlator  . Thyroid disease     Past Surgical History:  Procedure Laterality Date  . [facility]     [facility]  . CARDIAC ELECTROPHYSIOLOGY STUDY AND ABLATION     06/2010  . PACEMAKER PLACEMENT     [facility]    Family History  Problem Relation Age of Onset  . Heart attack Brother   . Heart disease Sister        Murmur  . Thyroid disease Sister   . Diabetes Brother     Social History   Socioeconomic History  . Marital status: Married    Spouse name: Maurine MinisterDennis  . Number of children: 1  . Years of education: 4112  . Highest education level: 12th grade  Occupational History  . Occupation: disability    Comment: office work  Engineer, productionocial Needs  . Financial  resource strain: Not hard at all  . Food insecurity    Worry: Never true    Inability: Never true  . Transportation needs    Medical: No    Non-medical: No  Tobacco Use  . Smoking status: Former Smoker    Quit date: 04/04/2003    Years since quitting: 16.1  . Smokeless tobacco: Never Used  Substance and Sexual Activity  . Alcohol use: No  . Drug use: No  . Sexual activity: Not Currently  Lifestyle  . Physical activity    Days per week: 2 days    Minutes per session: 30 min  . Stress: Not at all  Relationships  . Social Musicianconnections    Talks on phone: Once a week    Gets together: Never    Attends religious service: Never    Active member of club or organization: No    Attends meetings of clubs or organizations: Never    Relationship status: Married  . Intimate partner violence    Fear of current or ex partner: No    Emotionally abused: No    Physically abused: No    Forced sexual activity: No  Other Topics Concern  . Not on file  Social History Narrative  1 caffeine dirink per day. No regular exercise.     Outpatient Medications Prior to Visit  Medication Sig Dispense Refill  . albuterol (VENTOLIN HFA) 108 (90 Base) MCG/ACT inhaler Inhale 2 puffs into the lungs every 6 (six) hours as needed for wheezing or shortness of breath. 18 g prn  . ALPRAZolam (XANAX) 0.5 MG tablet Take 1 tablet (0.5 mg total) by mouth daily as needed for anxiety. 30 tablet 0  . aspirin 81 MG tablet Take 81 mg by mouth daily.    . Cholecalciferol (VITAMIN D PO) Take 2,000 Int'l Units by mouth daily.     Marland Kitchen levothyroxine (SYNTHROID) 125 MCG tablet TAKE 1 TABLET EVERY DAY 90 tablet 1  . magnesium oxide (MAG-OX) 400 MG tablet Take 400 mg by mouth 2 (two) times daily.    . metFORMIN (GLUCOPHAGE-XR) 500 MG 24 hr tablet TAKE 1 TABLET BY MOUTH EVERY DAY WITH BREAKFAST 90 tablet 3  . metoprolol (LOPRESSOR) 50 MG tablet Take 50 mg by mouth 2 (two) times daily.    Marland Kitchen oxybutynin (DITROPAN) 5 MG tablet Take 1  tablet (5 mg total) by mouth 2 (two) times daily. 60 tablet 3  . atorvastatin (LIPITOR) 20 MG tablet TAKE 1 TABLET(20 MG) BY MOUTH DAILY 90 tablet 0  . FLUoxetine (PROZAC) 20 MG capsule TAKE 3 CAPSULES EVERY DAY 270 capsule 1  . AMBULATORY NON FORMULARY MEDICATION Need download from CPAP for last 2 weeks to 30 days. Fax to [employer].  Fax results to [phone]. (Patient not taking: Reported on 05/12/2019) 1 Units 0   No facility-administered medications prior to visit.     Allergies  Allergen Reactions  . Morphine And Related Swelling    Swelling in face    ROS Review of Systems    Objective:    Physical Exam  Constitutional: She is oriented to person, place, and time. She appears well-developed and well-nourished.  HENT:  Head: Normocephalic and atraumatic.  Right Ear: External ear normal.  Left Ear: External ear normal.  Nose: Nose normal.  Mouth/Throat: Oropharynx is clear and moist.  TMs and canals are clear.   Eyes: Pupils are equal, round, and reactive to light. Conjunctivae and EOM are normal.  Neck: Neck supple. No thyromegaly present.  Cardiovascular: Normal rate, regular rhythm and normal heart sounds.  Pulmonary/Chest: Effort normal and breath sounds normal. She has no wheezes.  Lymphadenopathy:    She has no cervical adenopathy.  Neurological: She is alert and oriented to person, place, and time.  Skin: Skin is warm and dry.  Psychiatric: She has a normal mood and affect. Her behavior is normal.    BP (!) 126/57   Pulse 76   Temp 98.3 F (36.8 C)   Ht 5\' 3"  (1.6 m)   Wt 240 lb (108.9 kg)   SpO2 94%   BMI 42.51 kg/m  Wt Readings from Last 3 Encounters:  05/19/19 240 lb (108.9 kg)  05/12/19 240 lb (108.9 kg)  12/01/17 222 lb (100.7 kg)     Health Maintenance Due  Topic Date Due  . URINE MICROALBUMIN  04/24/2018  . HEMOGLOBIN A1C  10/01/2018    There are no preventive care reminders to display for this patient.  Lab Results  Component Value Date    TSH 0.70 04/02/2018   Lab Results  Component Value Date   WBC 7.8 04/21/2018   HGB 14.3 04/21/2018   HCT 43.8 04/21/2018   MCV 90.7 04/21/2018   PLT 313 04/21/2018  Lab Results  Component Value Date   NA 138 04/02/2018   K 4.7 04/02/2018   CO2 21 04/02/2018   GLUCOSE 95 04/02/2018   BUN 9 04/02/2018   CREATININE 0.82 04/02/2018   BILITOT 0.5 04/02/2018   ALKPHOS 155 (H) 07/18/2016   AST 20 04/02/2018   ALT 18 04/02/2018   PROT 6.8 04/02/2018   ALBUMIN 3.9 07/18/2016   CALCIUM 9.3 04/02/2018   Lab Results  Component Value Date   CHOL 169 08/12/2017   Lab Results  Component Value Date   HDL 48 (L) 08/12/2017   Lab Results  Component Value Date   LDLCALC 103 (H) 08/12/2017   Lab Results  Component Value Date   TRIG 90 08/12/2017   Lab Results  Component Value Date   CHOLHDL 3.5 08/12/2017   Lab Results  Component Value Date   HGBA1C 6.1 (A) 05/19/2019      Assessment & Plan:   Problem List Items Addressed This Visit      Respiratory   COPD (chronic obstructive pulmonary disease) (HCC)    Stable.  No recent flares or exacerbations.      Relevant Medications   azithromycin (ZITHROMAX) 250 MG tablet     Endocrine   Microalbuminuria due to type 2 diabetes mellitus (HCC)    Diagnosis.  Discussed the importance of starting a low-dose apex inhibitor now that she has microalbuminuria to help with renal protection.  New prescription sent to pharmacy.      Relevant Medications   lisinopril (ZESTRIL) 5 MG tablet   atorvastatin (LIPITOR) 20 MG tablet   Other Relevant Orders   POCT glycosylated hemoglobin (Hb A1C) (Completed)   POCT UA - Microalbumin (Completed)   Hypothyroid    Due to recheck TSH.  Making adjustments as needed but it sounds like she is actually doing well.      Diabetes type 2, controlled (HCC) - Primary   Relevant Medications   lisinopril (ZESTRIL) 5 MG tablet   atorvastatin (LIPITOR) 20 MG tablet   Other Relevant Orders    POCT glycosylated hemoglobin (Hb A1C) (Completed)   POCT UA - Microalbumin (Completed)    Other Visit Diagnoses    Encounter for screening colonoscopy       Relevant Orders   Cologuard   Acute non-recurrent sinusitis, unspecified location       Relevant Medications   azithromycin (ZITHROMAX) 250 MG tablet     Acute sinusitis-it sounds like secondary to recent allergy symptoms.  No known Covid exposures I did go ahead and treat her with azithromycin today.  Call if not better in 1 week.  Will call back sooner if develops new symptoms such as shortness of breath or fever.  Meds ordered this encounter  Medications  . AMBULATORY NON FORMULARY MEDICATION    Sig: Medication Name: Tdap x1 IM    Dispense:  1 vial    Refill:  0  . lisinopril (ZESTRIL) 5 MG tablet    Sig: Take 1 tablet (5 mg total) by mouth daily.    Dispense:  90 tablet    Refill:  3  . azithromycin (ZITHROMAX) 250 MG tablet    Sig: 2 Ttabs PO on Day 1, then one a day x 4 days.    Dispense:  6 tablet    Refill:  0  . atorvastatin (LIPITOR) 20 MG tablet    Sig: TAKE 1 TABLET(20 MG) BY MOUTH NIGHTLY    Dispense:  90 tablet    Refill:  3  . FLUoxetine (PROZAC) 20 MG capsule    Sig: TAKE 3 CAPSULES EVERY DAY    Dispense:  270 capsule    Refill:  1    Follow-up: Return in about 4 months (around 09/16/2019) for Diabetes follow-up, Hypertension.    Beatrice Lecher, MD

## 2019-05-19 NOTE — Telephone Encounter (Signed)
Carloyn Manner, Shakevia's husband, called very upset because his wife was in the lab and there was no order. There is an order, however, it is in a pending status. I was unable to release the orders. I reordered Lipid, TSH and CMP.  There is also an order for a Cologuard pending. Do you want me to reorder the Cologuard?

## 2019-05-20 LAB — COMPLETE METABOLIC PANEL WITH GFR
AG Ratio: 1.1 (calc) (ref 1.0–2.5)
ALT: 23 U/L (ref 6–29)
AST: 22 U/L (ref 10–35)
Albumin: 4 g/dL (ref 3.6–5.1)
Alkaline phosphatase (APISO): 239 U/L — ABNORMAL HIGH (ref 37–153)
BUN/Creatinine Ratio: 15 (calc) (ref 6–22)
BUN: 16 mg/dL (ref 7–25)
CO2: 25 mmol/L (ref 20–32)
Calcium: 9.4 mg/dL (ref 8.6–10.4)
Chloride: 103 mmol/L (ref 98–110)
Creat: 1.1 mg/dL — ABNORMAL HIGH (ref 0.50–0.99)
GFR, Est African American: 60 mL/min/{1.73_m2} (ref >=60)
GFR, Est Non African American: 52 mL/min/{1.73_m2} — ABNORMAL LOW (ref >=60)
Globulin: 3.7 g/dL (calc) (ref 1.9–3.7)
Glucose, Bld: 140 mg/dL — ABNORMAL HIGH (ref 65–99)
Potassium: 5 mmol/L (ref 3.5–5.3)
Sodium: 138 mmol/L (ref 135–146)
Total Bilirubin: 0.5 mg/dL (ref 0.2–1.2)
Total Protein: 7.7 g/dL (ref 6.1–8.1)

## 2019-05-20 LAB — LIPID PANEL W/REFLEX DIRECT LDL
Cholesterol: 127 mg/dL (ref <200)
HDL: 51 mg/dL (ref >=50)
LDL Cholesterol (Calc): 60 mg/dL (calc)
Non-HDL Cholesterol (Calc): 76 mg/dL (calc) (ref <130)
Total CHOL/HDL Ratio: 2.5 (calc) (ref <5.0)
Triglycerides: 80 mg/dL (ref <150)

## 2019-05-20 LAB — TSH: TSH: 2.42 mIU/L (ref 0.40–4.50)

## 2019-05-21 ENCOUNTER — Other Ambulatory Visit: Payer: Self-pay | Admitting: Family Medicine

## 2019-05-21 ENCOUNTER — Other Ambulatory Visit: Payer: Self-pay | Admitting: *Deleted

## 2019-05-21 MED ORDER — ATORVASTATIN CALCIUM 20 MG PO TABS: ORAL_TABLET | ORAL | 3 refills | Status: DC

## 2019-05-31 ENCOUNTER — Other Ambulatory Visit: Payer: Self-pay | Admitting: Family Medicine

## 2019-06-14 DIAGNOSIS — I472 Ventricular tachycardia: Secondary | ICD-10-CM | POA: Diagnosis not present

## 2019-08-03 DIAGNOSIS — H524 Presbyopia: Secondary | ICD-10-CM | POA: Diagnosis not present

## 2019-08-03 DIAGNOSIS — E119 Type 2 diabetes mellitus without complications: Secondary | ICD-10-CM | POA: Diagnosis not present

## 2019-08-03 DIAGNOSIS — H5213 Myopia, bilateral: Secondary | ICD-10-CM | POA: Diagnosis not present

## 2019-08-03 LAB — HM DIABETES EYE EXAM

## 2019-08-04 DIAGNOSIS — G8911 Acute pain due to trauma: Secondary | ICD-10-CM | POA: Diagnosis not present

## 2019-08-04 DIAGNOSIS — Z7982 Long term (current) use of aspirin: Secondary | ICD-10-CM | POA: Diagnosis not present

## 2019-08-04 DIAGNOSIS — E119 Type 2 diabetes mellitus without complications: Secondary | ICD-10-CM | POA: Diagnosis not present

## 2019-08-04 DIAGNOSIS — S92342A Displaced fracture of fourth metatarsal bone, left foot, initial encounter for closed fracture: Secondary | ICD-10-CM | POA: Diagnosis not present

## 2019-08-04 DIAGNOSIS — R296 Repeated falls: Secondary | ICD-10-CM | POA: Diagnosis not present

## 2019-08-04 DIAGNOSIS — M889 Osteitis deformans of unspecified bone: Secondary | ICD-10-CM | POA: Diagnosis not present

## 2019-08-04 DIAGNOSIS — Z87891 Personal history of nicotine dependence: Secondary | ICD-10-CM | POA: Diagnosis not present

## 2019-08-04 DIAGNOSIS — Z950 Presence of cardiac pacemaker: Secondary | ICD-10-CM | POA: Diagnosis not present

## 2019-08-04 DIAGNOSIS — S92345A Nondisplaced fracture of fourth metatarsal bone, left foot, initial encounter for closed fracture: Secondary | ICD-10-CM | POA: Diagnosis not present

## 2019-08-04 DIAGNOSIS — S92352A Displaced fracture of fifth metatarsal bone, left foot, initial encounter for closed fracture: Secondary | ICD-10-CM | POA: Diagnosis not present

## 2019-08-04 DIAGNOSIS — Z95810 Presence of automatic (implantable) cardiac defibrillator: Secondary | ICD-10-CM | POA: Diagnosis not present

## 2019-08-04 DIAGNOSIS — J449 Chronic obstructive pulmonary disease, unspecified: Secondary | ICD-10-CM | POA: Diagnosis not present

## 2019-08-05 DIAGNOSIS — S92302A Fracture of unspecified metatarsal bone(s), left foot, initial encounter for closed fracture: Secondary | ICD-10-CM | POA: Diagnosis not present

## 2019-08-11 DIAGNOSIS — R2689 Other abnormalities of gait and mobility: Secondary | ICD-10-CM | POA: Diagnosis not present

## 2019-08-11 DIAGNOSIS — R296 Repeated falls: Secondary | ICD-10-CM | POA: Diagnosis not present

## 2019-08-11 DIAGNOSIS — R531 Weakness: Secondary | ICD-10-CM | POA: Diagnosis not present

## 2019-08-11 DIAGNOSIS — G609 Hereditary and idiopathic neuropathy, unspecified: Secondary | ICD-10-CM | POA: Diagnosis not present

## 2019-08-11 DIAGNOSIS — E1169 Type 2 diabetes mellitus with other specified complication: Secondary | ICD-10-CM | POA: Diagnosis not present

## 2019-08-25 DIAGNOSIS — I472 Ventricular tachycardia: Secondary | ICD-10-CM | POA: Diagnosis not present

## 2019-08-25 DIAGNOSIS — Z95810 Presence of automatic (implantable) cardiac defibrillator: Secondary | ICD-10-CM | POA: Diagnosis not present

## 2019-08-25 DIAGNOSIS — I4581 Long QT syndrome: Secondary | ICD-10-CM | POA: Diagnosis not present

## 2019-08-25 DIAGNOSIS — Z4502 Encounter for adjustment and management of automatic implantable cardiac defibrillator: Secondary | ICD-10-CM | POA: Diagnosis not present

## 2019-08-31 DIAGNOSIS — J449 Chronic obstructive pulmonary disease, unspecified: Secondary | ICD-10-CM | POA: Diagnosis not present

## 2019-08-31 DIAGNOSIS — Z87891 Personal history of nicotine dependence: Secondary | ICD-10-CM | POA: Diagnosis not present

## 2019-08-31 DIAGNOSIS — K219 Gastro-esophageal reflux disease without esophagitis: Secondary | ICD-10-CM | POA: Diagnosis not present

## 2019-08-31 DIAGNOSIS — E119 Type 2 diabetes mellitus without complications: Secondary | ICD-10-CM | POA: Diagnosis not present

## 2019-08-31 DIAGNOSIS — G4733 Obstructive sleep apnea (adult) (pediatric): Secondary | ICD-10-CM | POA: Diagnosis not present

## 2019-08-31 DIAGNOSIS — I472 Ventricular tachycardia: Secondary | ICD-10-CM | POA: Diagnosis not present

## 2019-08-31 DIAGNOSIS — Z45010 Encounter for checking and testing of cardiac pacemaker pulse generator [battery]: Secondary | ICD-10-CM | POA: Diagnosis not present

## 2019-08-31 DIAGNOSIS — Z20822 Contact with and (suspected) exposure to covid-19: Secondary | ICD-10-CM | POA: Diagnosis not present

## 2019-08-31 DIAGNOSIS — I10 Essential (primary) hypertension: Secondary | ICD-10-CM | POA: Diagnosis not present

## 2019-08-31 DIAGNOSIS — F419 Anxiety disorder, unspecified: Secondary | ICD-10-CM | POA: Diagnosis not present

## 2019-09-01 ENCOUNTER — Telehealth: Payer: Self-pay

## 2019-09-01 NOTE — Telephone Encounter (Signed)
This is just a mild elevation which really should not prevent her from getting her pacemaker but I am happy to do a virtual if he wants it worked up further before he is willing to put the pacemaker replacement.

## 2019-09-01 NOTE — Telephone Encounter (Signed)
Robin Murillo called and states she had lab work with Dr Quita Skye office. She has surgery scheduled for Friday for a pacemaker replacement. She states she was told by Dr Clovis Riley that her WBC count was elevated to 11.2 and that she will need to call Dr Linford Arnold to find out why it is elevated. I called Dr Quita Skye office and left a message for a return call to try and get more information.    Dr. Redge Gainer. Clovis Riley, MD Address: [address] [place], [place] [address] Phone: [phone]

## 2019-09-02 NOTE — Telephone Encounter (Signed)
Patient advised. Does not want virtual at this time.

## 2019-09-02 NOTE — Telephone Encounter (Signed)
Robin Murillo received call back from cardiology if you still need to speak with them

## 2019-09-03 DIAGNOSIS — E119 Type 2 diabetes mellitus without complications: Secondary | ICD-10-CM | POA: Diagnosis not present

## 2019-09-03 DIAGNOSIS — Z4502 Encounter for adjustment and management of automatic implantable cardiac defibrillator: Secondary | ICD-10-CM | POA: Diagnosis not present

## 2019-09-03 DIAGNOSIS — I4581 Long QT syndrome: Secondary | ICD-10-CM | POA: Diagnosis not present

## 2019-09-03 DIAGNOSIS — Z6841 Body Mass Index (BMI) 40.0 and over, adult: Secondary | ICD-10-CM | POA: Diagnosis not present

## 2019-09-03 DIAGNOSIS — I10 Essential (primary) hypertension: Secondary | ICD-10-CM | POA: Diagnosis not present

## 2019-09-03 DIAGNOSIS — E039 Hypothyroidism, unspecified: Secondary | ICD-10-CM | POA: Diagnosis not present

## 2019-09-03 DIAGNOSIS — J449 Chronic obstructive pulmonary disease, unspecified: Secondary | ICD-10-CM | POA: Diagnosis not present

## 2019-09-03 DIAGNOSIS — K219 Gastro-esophageal reflux disease without esophagitis: Secondary | ICD-10-CM | POA: Diagnosis not present

## 2019-09-03 DIAGNOSIS — Z45010 Encounter for checking and testing of cardiac pacemaker pulse generator [battery]: Secondary | ICD-10-CM | POA: Diagnosis not present

## 2019-09-03 DIAGNOSIS — F419 Anxiety disorder, unspecified: Secondary | ICD-10-CM | POA: Diagnosis not present

## 2019-09-03 DIAGNOSIS — Z87891 Personal history of nicotine dependence: Secondary | ICD-10-CM | POA: Diagnosis not present

## 2019-09-03 DIAGNOSIS — G4733 Obstructive sleep apnea (adult) (pediatric): Secondary | ICD-10-CM | POA: Diagnosis not present

## 2019-09-03 DIAGNOSIS — I472 Ventricular tachycardia: Secondary | ICD-10-CM | POA: Diagnosis not present

## 2019-09-09 DIAGNOSIS — S92302A Fracture of unspecified metatarsal bone(s), left foot, initial encounter for closed fracture: Secondary | ICD-10-CM | POA: Diagnosis not present

## 2019-09-16 ENCOUNTER — Encounter: Payer: Self-pay | Admitting: Family Medicine

## 2019-09-16 ENCOUNTER — Ambulatory Visit (INDEPENDENT_AMBULATORY_CARE_PROVIDER_SITE_OTHER): Payer: Medicare HMO | Admitting: Family Medicine

## 2019-09-16 ENCOUNTER — Other Ambulatory Visit: Payer: Self-pay

## 2019-09-16 VITALS — BP 117/51 | HR 73 | Ht 63.0 in | Wt 240.0 lb

## 2019-09-16 DIAGNOSIS — R809 Proteinuria, unspecified: Secondary | ICD-10-CM

## 2019-09-16 DIAGNOSIS — F418 Other specified anxiety disorders: Secondary | ICD-10-CM | POA: Diagnosis not present

## 2019-09-16 DIAGNOSIS — K2100 Gastro-esophageal reflux disease with esophagitis, without bleeding: Secondary | ICD-10-CM

## 2019-09-16 DIAGNOSIS — E1129 Type 2 diabetes mellitus with other diabetic kidney complication: Secondary | ICD-10-CM

## 2019-09-16 DIAGNOSIS — Z95810 Presence of automatic (implantable) cardiac defibrillator: Secondary | ICD-10-CM

## 2019-09-16 DIAGNOSIS — E039 Hypothyroidism, unspecified: Secondary | ICD-10-CM | POA: Diagnosis not present

## 2019-09-16 DIAGNOSIS — E119 Type 2 diabetes mellitus without complications: Secondary | ICD-10-CM | POA: Diagnosis not present

## 2019-09-16 LAB — POCT GLYCOSYLATED HEMOGLOBIN (HGB A1C): Hemoglobin A1C: 5.7 % — AB (ref 4.0–5.6)

## 2019-09-16 MED ORDER — PANTOPRAZOLE SODIUM 40 MG PO TBEC: 40.0000 mg | DELAYED_RELEASE_TABLET | Freq: Every day | ORAL | 0 refills | Status: DC

## 2019-09-16 NOTE — Assessment & Plan Note (Signed)
A1c looks beautiful today at 5.7.  Follow-up in 4 months.

## 2019-09-16 NOTE — Assessment & Plan Note (Signed)
Tolerating low-dose lisinopril well.  Continue current regimen and continue to monitor renal function every 6 months.

## 2019-09-16 NOTE — Assessment & Plan Note (Signed)
Oertli on fluoxetine 60 mg daily happy with her current dose.  PHQ-9 score of 4 and GAD-7 score of 4.  Overall at goal.  Continue current therapy.  Plan to follow-up in 4 months.

## 2019-09-16 NOTE — Progress Notes (Signed)
Established Patient Office Visit  Subjective:  Patient ID: Robin Murillo, female    DOB: March 08, 1951  Age: 69 y.o. MRN: [id number]  CC:  Chief Complaint  Patient presents with  . Diabetes  . Hypertension    HPI [name] presents for   Diabetes - no hypoglycemic events. No wounds or sores that are not healing well. No increased thirst or urination. Checking glucose at home. Taking medications as prescribed without any side effects.  Microalbuminuria secondary to diabetes.     Complains of increased reflux symptoms over the last couple of months.  She does take her Nexium occasionally but is also been using some as needed Tums she does get temporary relief but says her symptoms have become more frequent and numbness daily.  She is not having any dysphagia symptoms.  She says now she just feels like anything she eats seems to trigger reflux.  Hyperlipidemia - tolerating stating well with no myalgias or significant side effects.  Lab Results  Component Value Date   CHOL 127 05/19/2019   HDL 51 05/19/2019   LDLCALC 60 05/19/2019   TRIG 80 05/19/2019   CHOLHDL 2.5 05/19/2019   She tripped over a step and broker her ankle while trying to get to COVID vaccine.  She is in a postop shoe and has a follow-up in a couple of weeks and hopefully will be able to get out of it at that point.  She says it still hurts at times but she does feel like it is getting better.  She also had her pacemaker/defibrillator replaced about 2 weeks ago she says it still fairly sore but is again seems to be healing she still has sutures in place.   Past Medical History:  Diagnosis Date  . Heart disease    pacemaker/defiberlator  . Thyroid disease     Past Surgical History:  Procedure Laterality Date  . CARDIAC DEFIBRILLATOR PLACEMENT     06/2010 at [facility]  . CARDIAC ELECTROPHYSIOLOGY STUDY AND ABLATION     06/2010  . PACEMAKER PLACEMENT     06/2010 at [facility]    Family History   Problem Relation Age of Onset  . Heart attack Brother   . Heart disease Sister        Murmur  . Thyroid disease Sister   . Diabetes Brother     Social History   Socioeconomic History  . Marital status: Married    Spouse name: Maurine Minister  . Number of children: 1  . Years of education: 13  . Highest education level: 12th grade  Occupational History  . Occupation: disability    Comment: office work  Tobacco Use  . Smoking status: Former Smoker    Quit date: 04/04/2003    Years since quitting: 16.4  . Smokeless tobacco: Never Used  Substance and Sexual Activity  . Alcohol use: No  . Drug use: No  . Sexual activity: Not Currently  Other Topics Concern  . Not on file  Social History Narrative   1 caffeine dirink per day. No regular exercise.    Social Determinants of Health   Financial Resource Strain: Low Risk   . Difficulty of Paying Living Expenses: Not hard at all  Food Insecurity: No Food Insecurity  . Worried About Programme researcher, broadcasting/film/video in the Last Year: Never true  . Ran Out of Food in the Last Year: Never true  Transportation Needs: No Transportation Needs  . Lack of Transportation (Medical): No  .  Lack of Transportation (Non-Medical): No  Physical Activity: Insufficiently Active  . Days of Exercise per Week: 2 days  . Minutes of Exercise per Session: 30 min  Stress: No Stress Concern Present  . Feeling of Stress : Not at all  Social Connections: Moderately Isolated  . Frequency of Communication with Friends and Family: Once a week  . Frequency of Social Gatherings with Friends and Family: Never  . Attends Religious Services: Never  . Active Member of Clubs or Organizations: No  . Attends Banker Meetings: Never  . Marital Status: Married  Catering manager Violence: Not At Risk  . Fear of Current or Ex-Partner: No  . Emotionally Abused: No  . Physically Abused: No  . Sexually Abused: No    Outpatient Medications Prior to Visit  Medication Sig  Dispense Refill  . albuterol (VENTOLIN HFA) 108 (90 Base) MCG/ACT inhaler Inhale 2 puffs into the lungs every 6 (six) hours as needed for wheezing or shortness of breath. 18 g prn  . ALPRAZolam (XANAX) 0.5 MG tablet Take 1 tablet (0.5 mg total) by mouth daily as needed for anxiety. 30 tablet 0  . aspirin 81 MG tablet Take 81 mg by mouth daily.    Marland Kitchen atorvastatin (LIPITOR) 20 MG tablet TAKE 1 TABLET(20 MG) BY MOUTH NIGHTLY 90 tablet 3  . Cholecalciferol (VITAMIN D PO) Take 2,000 Int'l Units by mouth daily.     Marland Kitchen FLUoxetine (PROZAC) 20 MG capsule TAKE 3 CAPSULES EVERY DAY 270 capsule 1  . levothyroxine (SYNTHROID) 125 MCG tablet TAKE 1 TABLET EVERY DAY 90 tablet 1  . lisinopril (ZESTRIL) 5 MG tablet Take 1 tablet (5 mg total) by mouth daily. 90 tablet 3  . magnesium oxide (MAG-OX) 400 MG tablet Take 400 mg by mouth 2 (two) times daily.    . metFORMIN (GLUCOPHAGE-XR) 500 MG 24 hr tablet TAKE 1 TABLET BY MOUTH EVERY DAY WITH BREAKFAST 90 tablet 3  . metoprolol (LOPRESSOR) 50 MG tablet Take 50 mg by mouth 2 (two) times daily.    Marland Kitchen oxybutynin (DITROPAN) 5 MG tablet Take 1 tablet (5 mg total) by mouth 2 (two) times daily. 60 tablet 3  . AMBULATORY NON FORMULARY MEDICATION Medication Name: Tdap x1 IM 1 vial 0   No facility-administered medications prior to visit.    Allergies  Allergen Reactions  . Morphine And Related Swelling    Swelling in face    ROS Review of Systems    Objective:    Physical Exam  Constitutional: She is oriented to person, place, and time. She appears well-developed and well-nourished.  HENT:  Head: Normocephalic and atraumatic.  Cardiovascular: Normal rate, regular rhythm and normal heart sounds.  Pulmonary/Chest: Effort normal and breath sounds normal.  Neurological: She is alert and oriented to person, place, and time.  Skin: Skin is warm and dry.  Psychiatric: She has a normal mood and affect. Her behavior is normal.    BP (!) 117/51   Pulse 73   Ht 5\' 3"   (1.6 m)   Wt 240 lb (108.9 kg)   SpO2 94%   BMI 42.51 kg/m  Wt Readings from Last 3 Encounters:  09/16/19 240 lb (108.9 kg)  05/19/19 240 lb (108.9 kg)  05/12/19 240 lb (108.9 kg)     There are no preventive care reminders to display for this patient.  There are no preventive care reminders to display for this patient.  Lab Results  Component Value Date   TSH 2.42 05/19/2019  Lab Results  Component Value Date   WBC 7.8 04/21/2018   HGB 14.3 04/21/2018   HCT 43.8 04/21/2018   MCV 90.7 04/21/2018   PLT 313 04/21/2018   Lab Results  Component Value Date   NA 138 05/19/2019   K 5.0 05/19/2019   CO2 25 05/19/2019   GLUCOSE 140 (H) 05/19/2019   BUN 16 05/19/2019   CREATININE 1.10 (H) 05/19/2019   BILITOT 0.5 05/19/2019   ALKPHOS 155 (H) 07/18/2016   AST 22 05/19/2019   ALT 23 05/19/2019   PROT 7.7 05/19/2019   ALBUMIN 3.9 07/18/2016   CALCIUM 9.4 05/19/2019   Lab Results  Component Value Date   CHOL 127 05/19/2019   Lab Results  Component Value Date   HDL 51 05/19/2019   Lab Results  Component Value Date   LDLCALC 60 05/19/2019   Lab Results  Component Value Date   TRIG 80 05/19/2019   Lab Results  Component Value Date   CHOLHDL 2.5 05/19/2019   Lab Results  Component Value Date   HGBA1C 5.7 (A) 09/16/2019      Assessment & Plan:   Problem List Items Addressed This Visit      Digestive   GERD (gastroesophageal reflux disease)    She is having a recent increase in daily persistent symptoms but not taking her home PPI with Nexium regularly so we discussed putting her on something daily about 20 minutes before breakfast for at least a month.  And really changing her diet.  Avoiding greasy, spicy, acidic foods in addition to avoiding caffeinated beverages as well as carbonated beverages.      Relevant Medications   pantoprazole (PROTONIX) 40 MG tablet     Endocrine   Microalbuminuria due to type 2 diabetes mellitus (HCC)    Tolerating  low-dose lisinopril well.  Continue current regimen and continue to monitor renal function every 6 months.      Hypothyroid   Diabetes type 2, controlled ([place]) - Primary    A1c looks beautiful today at 5.7.  Follow-up in 4 months.      Relevant Orders   POCT glycosylated hemoglobin (Hb A1C) (Completed)     Other   Depression with anxiety    Oertli on fluoxetine 60 mg daily happy with her current dose.  PHQ-9 score of 4 and GAD-7 score of 4.  Overall at goal.  Continue current therapy.  Plan to follow-up in 4 months.      Cardiac defibrillator in place    Incision looks clean dry and intact.  Little bit of erythema right around the incision.         Meds ordered this encounter  Medications  . pantoprazole (PROTONIX) 40 MG tablet    Sig: Take 1 tablet (40 mg total) by mouth daily.    Dispense:  30 tablet    Refill:  0    Follow-up: Return in about 4 months (around 01/16/2020) for Diabetes follow-up.    Beatrice Lecher, MD

## 2019-09-16 NOTE — Assessment & Plan Note (Signed)
She is having a recent increase in daily persistent symptoms but not taking her home PPI with Nexium regularly so we discussed putting her on something daily about 20 minutes before breakfast for at least a month.  And really changing her diet.  Avoiding greasy, spicy, acidic foods in addition to avoiding caffeinated beverages as well as carbonated beverages.

## 2019-09-16 NOTE — Assessment & Plan Note (Signed)
Incision looks clean dry and intact.  Little bit of erythema right around the incision.

## 2019-09-17 ENCOUNTER — Other Ambulatory Visit: Payer: Self-pay | Admitting: *Deleted

## 2019-09-17 DIAGNOSIS — K2100 Gastro-esophageal reflux disease with esophagitis, without bleeding: Secondary | ICD-10-CM

## 2019-09-17 MED ORDER — PANTOPRAZOLE SODIUM 40 MG PO TBEC: 40.0000 mg | DELAYED_RELEASE_TABLET | Freq: Every day | ORAL | 3 refills | Status: DC

## 2019-10-20 DIAGNOSIS — Z4502 Encounter for adjustment and management of automatic implantable cardiac defibrillator: Secondary | ICD-10-CM | POA: Diagnosis not present

## 2019-11-03 ENCOUNTER — Other Ambulatory Visit: Payer: Self-pay | Admitting: Family Medicine

## 2019-11-04 DIAGNOSIS — S92302A Fracture of unspecified metatarsal bone(s), left foot, initial encounter for closed fracture: Secondary | ICD-10-CM | POA: Diagnosis not present

## 2019-11-08 DIAGNOSIS — R2689 Other abnormalities of gait and mobility: Secondary | ICD-10-CM | POA: Diagnosis not present

## 2019-11-08 DIAGNOSIS — G609 Hereditary and idiopathic neuropathy, unspecified: Secondary | ICD-10-CM | POA: Diagnosis not present

## 2019-11-08 DIAGNOSIS — G629 Polyneuropathy, unspecified: Secondary | ICD-10-CM | POA: Diagnosis not present

## 2019-11-08 DIAGNOSIS — M79609 Pain in unspecified limb: Secondary | ICD-10-CM | POA: Diagnosis not present

## 2019-12-01 DIAGNOSIS — I472 Ventricular tachycardia: Secondary | ICD-10-CM | POA: Diagnosis not present

## 2019-12-01 DIAGNOSIS — Z95810 Presence of automatic (implantable) cardiac defibrillator: Secondary | ICD-10-CM | POA: Diagnosis not present

## 2019-12-01 DIAGNOSIS — I4581 Long QT syndrome: Secondary | ICD-10-CM | POA: Diagnosis not present

## 2020-01-18 ENCOUNTER — Ambulatory Visit: Payer: Medicare HMO | Admitting: Family Medicine

## 2020-01-24 DIAGNOSIS — Z4502 Encounter for adjustment and management of automatic implantable cardiac defibrillator: Secondary | ICD-10-CM | POA: Diagnosis not present

## 2020-01-24 DIAGNOSIS — I472 Ventricular tachycardia: Secondary | ICD-10-CM | POA: Diagnosis not present

## 2020-01-24 DIAGNOSIS — Z95810 Presence of automatic (implantable) cardiac defibrillator: Secondary | ICD-10-CM | POA: Diagnosis not present

## 2020-02-23 ENCOUNTER — Other Ambulatory Visit: Payer: Self-pay

## 2020-02-23 NOTE — Patient Outreach (Signed)
[facility]) Care Management  02/23/2020  Robin Murillo 09/12/1950 [id number]   Telephone Screen  Referral Date: 02/23/2020 Referral Source: "EMMI-Prevent-no response from letter" Insurance: [facility]     Outreach attempt #1 to patient. No answer. RN CM left HIPAA compliant voicemail message along with contact info. Patient has already been mailed unsuccessful letter.     Plan: RN CM will make outreach attempt to patient within 3-4 business days.   [name], RN,BSN,CCM [facility] Care Management Telephonic Care Management Coordinator Direct Phone: [phone] Toll Free: [phone] Fax: [phone]

## 2020-02-24 ENCOUNTER — Other Ambulatory Visit: Payer: Self-pay

## 2020-02-24 NOTE — Patient Outreach (Signed)
[facility]) Care Management  02/24/2020  Andy Moye 08/02/1950 [id number]   Referral Date: 02/23/2020 Referral Source: "EMMI-Prevent-no response from letter" Insurance: [facility]    Outreach attempt #2 to patient. No answer at present.    Plan: RN CM will make outreach attempt to patient within 3-4 business days.    [name], RN,BSN,CCM [facility] Care Management Telephonic Care Management Coordinator Direct Phone: [phone] Toll Free: [phone] Fax: [phone]

## 2020-02-25 ENCOUNTER — Other Ambulatory Visit: Payer: Self-pay

## 2020-02-25 NOTE — Patient Outreach (Signed)
[facility]) Care Management  02/25/2020  Robin Murillo 05-14-1951 [id number]    Telephone Screen Referral Date:02/23/2020 Referral Source:"EMMI-Prevent-no response from letter" Insurance:[employer]    Unsuccessful outreach attempt #3 to patient.   Plan: RN CM will make outreach attempt to patient within 3-4 weeks if no response from letter mailed to patient.  [name], RN,BSN,CCM [facility] Care Management Telephonic Care Management Coordinator Direct Phone: [phone] Toll Free: [phone] Fax: [phone]

## 2020-03-15 ENCOUNTER — Other Ambulatory Visit: Payer: Self-pay

## 2020-03-15 NOTE — Patient Outreach (Signed)
[facility]) Care Management  03/15/2020  Robin Murillo Jul 31, 1950 [id number]    Telephone Screen  Referral Date: 02/23/2020 Referral Source: "EMMI-Prevent-no response from letter" Insurance: [facility]    Multiple attempts to establish contact with patient without success. No response from letter mailed to patient. Case is being closed at this time.    Plan: RN CM will close case at this time.   [name], RN,BSN,CCM [facility] Care Management Telephonic Care Management Coordinator Direct Phone: [phone] Toll Free: [phone] Fax: [phone]

## 2020-03-16 ENCOUNTER — Ambulatory Visit: Payer: Self-pay

## 2020-03-29 ENCOUNTER — Telehealth: Payer: Self-pay | Admitting: Family Medicine

## 2020-03-29 NOTE — Telephone Encounter (Signed)
Please call patient to let her know she is overdue for her diabetic eye exam.  We do have an eye doctor in our building now with that is convenient for her.  If she has already been in the last 12 months then please let us know and we can get that report entered into her chart.

## 2020-03-29 NOTE — Telephone Encounter (Signed)
Patient said she has already had one but she had to call to get the information since she could not remember at the time of the call. She will call us back.

## 2020-04-03 NOTE — Telephone Encounter (Signed)
Razan called back and states she goes to [facility]. I sent a medical records fax to the [facility] for eye exam.

## 2020-04-08 ENCOUNTER — Other Ambulatory Visit: Payer: Self-pay | Admitting: Family Medicine

## 2020-04-08 DIAGNOSIS — R809 Proteinuria, unspecified: Secondary | ICD-10-CM

## 2020-04-08 DIAGNOSIS — E1129 Type 2 diabetes mellitus with other diabetic kidney complication: Secondary | ICD-10-CM

## 2020-04-22 ENCOUNTER — Other Ambulatory Visit: Payer: Self-pay | Admitting: Family Medicine

## 2020-05-06 ENCOUNTER — Other Ambulatory Visit: Payer: Self-pay | Admitting: Family Medicine

## 2020-05-08 DIAGNOSIS — I472 Ventricular tachycardia: Secondary | ICD-10-CM | POA: Diagnosis not present

## 2020-05-29 ENCOUNTER — Ambulatory Visit (INDEPENDENT_AMBULATORY_CARE_PROVIDER_SITE_OTHER): Payer: Medicare HMO | Admitting: Family Medicine

## 2020-05-29 DIAGNOSIS — Z23 Encounter for immunization: Secondary | ICD-10-CM

## 2020-06-02 ENCOUNTER — Telehealth (INDEPENDENT_AMBULATORY_CARE_PROVIDER_SITE_OTHER): Payer: Medicare HMO | Admitting: Physician Assistant

## 2020-06-02 ENCOUNTER — Encounter: Payer: Self-pay | Admitting: Physician Assistant

## 2020-06-02 VITALS — Temp 96.8°F | Ht 63.0 in | Wt 240.0 lb

## 2020-06-02 DIAGNOSIS — Z20822 Contact with and (suspected) exposure to covid-19: Secondary | ICD-10-CM | POA: Diagnosis not present

## 2020-06-02 DIAGNOSIS — J069 Acute upper respiratory infection, unspecified: Secondary | ICD-10-CM | POA: Diagnosis not present

## 2020-06-02 NOTE — Patient Instructions (Signed)
Coricidin OTC Vitamin C 1000mg  Zinc 50mg  Rest and hydrate Will call with covid results.    Upper Respiratory Infection, Adult An upper respiratory infection (URI) affects the nose, throat, and upper air passages. URIs are caused by germs (viruses). The most common type of URI is often called "the common cold." Medicines cannot cure URIs, but you can do things at home to relieve your symptoms. URIs usually get better within 7-10 days. Follow these instructions at home: Activity  Rest as needed.  If you have a fever, stay home from work or school until your fever is gone, or until your doctor says you may return to work or school. ? You should stay home until you cannot spread the infection anymore (you are not contagious). ? Your doctor may have you wear a face mask so you have less risk of spreading the infection. Relieving symptoms  Gargle with a salt-water mixture 3-4 times a day or as needed. To make a salt-water mixture, completely dissolve -1 tsp of salt in 1 cup of warm water.  Use a cool-mist humidifier to add moisture to the air. This can help you breathe more easily. Eating and drinking   Drink enough fluid to keep your pee (urine) pale yellow.  Eat soups and other clear broths. General instructions   Take over-the-counter and prescription medicines only as told by your doctor. These include cold medicines, fever reducers, and cough suppressants.  Do not use any products that contain nicotine or tobacco. These include cigarettes and e-cigarettes. If you need help quitting, ask your doctor.  Avoid being where people are smoking (avoid secondhand smoke).  Make sure you get regular shots and get the flu shot every year.  Keep all follow-up visits as told by your doctor. This is important. How to avoid spreading infection to others   Wash your hands often with soap and water. If you do not have soap and water, use hand sanitizer.  Avoid touching your mouth, face,  eyes, or nose.  Cough or sneeze into a tissue or your sleeve or elbow. Do not cough or sneeze into your hand or into the air. Contact a doctor if:  You are getting worse, not better.  You have any of these: ? A fever. ? Chills. ? Brown or red mucus in your nose. ? Yellow or brown fluid (discharge)coming from your nose. ? Pain in your face, especially when you bend forward. ? Swollen neck glands. ? Pain with swallowing. ? White areas in the back of your throat. Get help right away if:  You have shortness of breath that gets worse.  You have very bad or constant: ? Headache. ? Ear pain. ? Pain in your forehead, behind your eyes, and over your cheekbones (sinus pain). ? Chest pain.  You have long-lasting (chronic) lung disease along with any of these: ? Wheezing. ? Long-lasting cough. ? Coughing up blood. ? A change in your usual mucus.  You have a stiff neck.  You have changes in your: ? Vision. ? Hearing. ? Thinking. ? Mood. Summary  An upper respiratory infection (URI) is caused by a germ called a virus. The most common type of URI is often called "the common cold."  URIs usually get better within 7-10 days.  Take over-the-counter and prescription medicines only as told by your doctor. This information is not intended to replace advice given to you by your health care provider. Make sure you discuss any questions you have with your health care  provider. Document Revised: 07/09/2018 Document Reviewed: 02/21/2017 Elsevier Patient Education  2020 [employer].

## 2020-06-02 NOTE — Progress Notes (Signed)
Flu shot Monday AM Monday night started having chills, feeling "bad" Tuesday - sore throat, gone now  Now: Congestion Cough Runny nose  Not taking any medications for symptoms

## 2020-06-02 NOTE — Progress Notes (Signed)
Patient ID: Robin Murillo, female   DOB: [birth year], 69 y.o.   MRN: [id number] .Marland KitchenVirtual Visit via Telephone Note  I connected with Robin Murillo on 06/02/20 at  2:40 PM EST by telephone and verified that I am speaking with the correct person using two identifiers.  Location: Patient: home Provider: clinic   I discussed the limitations, risks, security and privacy concerns of performing an evaluation and management service by telephone and the availability of in person appointments. I also discussed with the patient that there may be a patient responsible charge related to this service. The patient expressed understanding and agreed to proceed.   History of Present Illness: Pt is a 69 yo female who calls into the clinic with URI symptoms since Monday after her flu shot. Pt has extensive cardiac history and T2DM. She has been having chills, body aches, ST, fatigue, congestion, cough, runny nose. No one else in home in sick. She does keep her granddaughters who are in school. She does not know what to take since she has so many medications and risk factors. No fever, SOB, loss of smell or taste or GI symptoms.   .. Active Ambulatory Problems    Diagnosis Date Noted  . Hypothyroid 12/02/2011  . Depression with anxiety 12/02/2011  . Obesity, Class III, BMI 40-49.9 (morbid obesity) (HCC) 12/04/2011  . Diabetes type 2, controlled (HCC) 12/04/2011  . Pacemaker 12/24/2011  . History of ventricular fibrillation 02/22/2013  . Obstructive sleep apnea 02/26/2013  . COPD (chronic obstructive pulmonary disease) (HCC) 04/21/2013  . AR (allergic rhinitis) 09/21/2013  . GERD (gastroesophageal reflux disease) 10/12/2014  . Cardiac defibrillator in place 01/17/2015  . 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 Microalbuminuria due to type 2 diabetes mellitus (HCC) 01/15/2016  . VT (ventricular tachycardia) (HCC) 11/28/2017  . Hereditary and idiopathic peripheral neuropathy 06/05/2010  . Prolonged QT syndrome  01/17/2017  . Vitamin D deficiency 06/06/2010  . OAB (overactive bladder) 04/02/2018  . Osteopenia 05/14/2018   Resolved Ambulatory Problems    Diagnosis Date Noted  . Long QT interval 03/08/2014  . Dyspnea 02/09/2013  . Cough 01/04/2014  . Pain in limb 06/05/2010   Past Medical History:  Diagnosis Date  . Heart disease   . Thyroid disease    Reviewed med, allergy, problem list.     Observations/Objective: No acute distress Normal breathing No labored breathing.   .. Today's Vitals   06/02/20 1436  Temp: (!) 96.8 F (36 C)  TempSrc: Oral  Weight: 240 lb (108.9 kg)  Height: 5\' 3"  (1.6 m)   Body mass index is 42.51 kg/m.    Assessment and Plan: Marland KitchenIthzel was seen today for nasal congestion and cough.  Diagnoses and all orders for this visit:  Upper respiratory tract infection, unspecified type -     Novel Coronavirus, NAA ([facility])  Suspected COVID-19 virus infection -     Novel Coronavirus, NAA ([facility])   Pt does have some covid like symptoms. Pt instructed to quarantine and start symptomatic care. covid testing done today. She would qualify for monoclonal antibodies if covid positive. Start vitamin C/zinc/vitamin D for immune support. Encouraged rest, hydration and coricidin.  If symptoms worsening or trouble breathing go to ED or UC.    Follow Up Instructions:    I discussed the assessment and treatment plan with the patient. The patient was provided an opportunity to ask questions and all were answered. The patient agreed with the plan and demonstrated an understanding of the instructions.  The patient was advised to call back or seek an in-person evaluation if the symptoms worsen or if the condition fails to improve as anticipated.  I provided 20 minutes of non-face-to-face time during this encounter.   Tandy Gaw, PA-C

## 2020-06-03 DIAGNOSIS — J069 Acute upper respiratory infection, unspecified: Secondary | ICD-10-CM | POA: Diagnosis not present

## 2020-06-03 DIAGNOSIS — Z20822 Contact with and (suspected) exposure to covid-19: Secondary | ICD-10-CM | POA: Diagnosis not present

## 2020-06-05 LAB — SARS-COV-2, NAA 2 DAY TAT

## 2020-06-05 LAB — NOVEL CORONAVIRUS, NAA: SARS-CoV-2, NAA: NOT DETECTED

## 2020-06-05 NOTE — Progress Notes (Signed)
Negative for covid. GREAT news. How are you feeling today?

## 2020-07-21 ENCOUNTER — Telehealth (INDEPENDENT_AMBULATORY_CARE_PROVIDER_SITE_OTHER): Payer: Medicare HMO | Admitting: Sports Medicine

## 2020-07-21 ENCOUNTER — Telehealth: Payer: Self-pay

## 2020-07-21 DIAGNOSIS — J449 Chronic obstructive pulmonary disease, unspecified: Secondary | ICD-10-CM | POA: Diagnosis not present

## 2020-07-21 MED ORDER — PREDNISONE 50 MG PO TABS: 50.0000 mg | ORAL_TABLET | Freq: Every day | ORAL | 0 refills | Status: DC

## 2020-07-21 MED ORDER — AZITHROMYCIN 250 MG PO TABS: ORAL_TABLET | ORAL | 0 refills | Status: DC

## 2020-07-21 NOTE — Progress Notes (Signed)
Cough and congestion; taking coricidin not getting any better. Feels like its in her chest. Head congestion, runny nose (clear) coughing up green stuff. No fever.

## 2020-07-21 NOTE — Telephone Encounter (Signed)
No.  Needs to be evaluated.  Welcome to do virtual visit.

## 2020-07-21 NOTE — Progress Notes (Signed)
   Virtual Visit via WebEx/MyChart   I connected with  Robin Murillo  on 07/21/20 via WebEx/MyChart/Doximity Video and verified that I am speaking with the correct person using two identifiers.   I discussed the limitations, risks, security and privacy concerns of performing an evaluation and management service by WebEx/MyChart/Doximity Video, including the higher likelihood of inaccurate diagnosis and treatment, and the availability of in person appointments.  We also discussed the likely need of an additional face to face encounter for complete and high quality delivery of care.  I also discussed with the patient that there may be a patient responsible charge related to this service. The patient expressed understanding and wishes to proceed.  Provider location is in medical facility. Patient location is at their home, different from provider location. People involved in care of the patient during this telehealth encounter were myself, my nurse/medical assistant, and my front office/scheduling team member.  Review of Systems: No fevers, chills, night sweats, weight loss, chest pain, or shortness of breath.   Objective Findings:    General: Speaking full sentences, no audible heavy breathing.  Sounds alert and appropriately interactive.  Appears well.  Face symmetric.  Extraocular movements intact.  Pupils equal and round.  No nasal flaring or accessory muscle use visualized.  Independent interpretation of tests performed by another provider:   None.  Brief History, Exam, Impression, and Recommendations:    COPD (chronic obstructive pulmonary disease) This is a pleasant 70 year old female with a history of COPD, looks like controlled with occasional albuterol only. After Christmas she had a get together and then developed symptoms of a viral illness including cough, runny nose, sore throat, muscle aches and body aches.  Mild anosmia and ageusia, she is speaking full sentences, no nasal  flaring or accessory muscle use on the camera. She is out of the window for Tamiflu, she is fully Covid vaccinated. We will treat this as a COPD exacerbation, she will use her inhalers as needed, adding 5 days of prednisone and azithromycin, she will get a chest x-ray on Monday. Return to see [place] if no better in 2 weeks.   I discussed the above assessment and treatment plan with the patient. The patient was provided an opportunity to ask questions and all were answered. The patient agreed with the plan and demonstrated an understanding of the instructions.   The patient was advised to call back or seek an in-person evaluation if the symptoms worsen or if the condition fails to improve as anticipated.   I provided 30 minutes of face to face and non-face-to-face time during this encounter date, time was needed to gather information, review chart, records, communicate/coordinate with staff remotely, as well as complete documentation.   ___________________________________________ Ihor Austin. Benjamin Stain, M.D., ABFM., CAQSM. Primary Care and Sports Medicine [place] [facility]  Adjunct Instructor of Family Medicine  [employer]

## 2020-07-21 NOTE — Telephone Encounter (Signed)
Robin Murillo has a slight productive cough and congestion with runny nose. Her husband states she doesn't have Covid. Denies fevers, chills, sweats or body aches. He has given her coricidin.   Declines appointment. Just wants antibiotic and/or prednisone.

## 2020-07-21 NOTE — Telephone Encounter (Signed)
Patient scheduled.

## 2020-07-21 NOTE — Assessment & Plan Note (Signed)
This is a pleasant 70 year old female with a history of COPD, looks like controlled with occasional albuterol only. After Christmas she had a get together and then developed symptoms of a viral illness including cough, runny nose, sore throat, muscle aches and body aches.  Mild anosmia and ageusia, she is speaking full sentences, no nasal flaring or accessory muscle use on the camera. She is out of the window for Tamiflu, she is fully Covid vaccinated. We will treat this as a COPD exacerbation, she will use her inhalers as needed, adding 5 days of prednisone and azithromycin, she will get a chest x-ray on Monday. Return to see [place] if no better in 2 weeks.

## 2020-07-25 ENCOUNTER — Ambulatory Visit (INDEPENDENT_AMBULATORY_CARE_PROVIDER_SITE_OTHER): Payer: Medicare HMO

## 2020-07-25 ENCOUNTER — Other Ambulatory Visit: Payer: Self-pay

## 2020-07-25 DIAGNOSIS — J449 Chronic obstructive pulmonary disease, unspecified: Secondary | ICD-10-CM | POA: Diagnosis not present

## 2020-07-25 DIAGNOSIS — I517 Cardiomegaly: Secondary | ICD-10-CM

## 2020-07-25 DIAGNOSIS — J984 Other disorders of lung: Secondary | ICD-10-CM | POA: Diagnosis not present

## 2020-07-25 DIAGNOSIS — R059 Cough, unspecified: Secondary | ICD-10-CM | POA: Diagnosis not present

## 2020-08-05 ENCOUNTER — Other Ambulatory Visit: Payer: Self-pay | Admitting: Family Medicine

## 2020-08-07 ENCOUNTER — Telehealth (INDEPENDENT_AMBULATORY_CARE_PROVIDER_SITE_OTHER): Payer: Medicare HMO | Admitting: Nurse Practitioner

## 2020-08-07 ENCOUNTER — Encounter: Payer: Self-pay | Admitting: Nurse Practitioner

## 2020-08-07 DIAGNOSIS — J209 Acute bronchitis, unspecified: Secondary | ICD-10-CM | POA: Diagnosis not present

## 2020-08-07 DIAGNOSIS — J440 Chronic obstructive pulmonary disease with acute lower respiratory infection: Secondary | ICD-10-CM

## 2020-08-07 DIAGNOSIS — U071 COVID-19: Secondary | ICD-10-CM | POA: Diagnosis not present

## 2020-08-07 DIAGNOSIS — I472 Ventricular tachycardia: Secondary | ICD-10-CM | POA: Diagnosis not present

## 2020-08-07 MED ORDER — BREO ELLIPTA 100-25 MCG/INH IN AEPB: 1.0000 | INHALATION_SPRAY | Freq: Every day | RESPIRATORY_TRACT | 5 refills | Status: DC

## 2020-08-07 MED ORDER — GUAIFENESIN ER 600 MG PO TB12: 1200.0000 mg | ORAL_TABLET | Freq: Two times a day (BID) | ORAL | 1 refills | Status: DC

## 2020-08-07 NOTE — Progress Notes (Signed)
Virtual Video Visit via MyChart Note  I connected with  [name] on 08/07/20 at 11:20 AM EST by the video enabled telemedicine application for , MyChart, and verified that I am speaking with the correct person using two identifiers.   I introduced myself as a Publishing rights manager with the practice. We discussed the limitations of evaluation and management by telemedicine and the availability of in person appointments. The patient expressed understanding and agreed to proceed.  Participating parties in this visit include: The patient and the nurse practitioner listed and nurse practitioner Hyman Hopes observing The patient is: At home I am: In the office  Subjective:    CC:  Chief Complaint  Patient presents with  . COPD  . Cough    HPI: Robin Murillo is a 70 y.o. year old female presenting today via MyChart today for cough, congestion, COPD exacerbation, COVID positive family members.   She reports her symptoms initially started in November of last year. She began to feel better, but then her symptoms significantly worsened about 2 weeks ago. She was started on antibiotics and oral steroids and is now improving, but not back to normal. Since she was treated her husband and son have tested positive for COVID.  Endorses cough with production, chest tightness (improving in the last two weeks), shortness of breath (improved from initial symptoms), ear pain and pressure, headache for two weeks straight (improved in the last 2-3 days), body aches (improved since 2 weeks ago), chills (improved since 2 weeks ago), loss of taste and loss of smell (is coming back at that time).   Denies fever, current headache, dizziness, palpitations.   Most symptoms are improving and the peak was about 2 weeks ago. She is just concerned about the ongoing cough and fatigue and is not sure if she needs a COVID test or more medication.   She has been taking coricidin for her symptoms, but it is not helpful.     Past medical history, Surgical history, Family history not pertinant except as noted below, Social history, Allergies, and medications have been entered into the medical record, reviewed, and corrections made.   Review of Systems:  All review of systems negative except what is listed in the HPI   Objective:    General:  Speaking clearly in complete sentences. Mild shortness of breath noted.   Alert and oriented x3.   Normal judgment.  Absent acute distress. She is audibly congested.   Impression and Recommendations:    1. COVID-19 - guaiFENesin (MUCINEX) 600 MG 12 hr tablet; Take 2 tablets (1,200 mg total) by mouth 2 (two) times daily.  Dispense: 28 tablet; Refill: 1 - fluticasone furoate-vilanterol (BREO ELLIPTA) 100-25 MCG/INH AEPB; Inhale 1 puff into the lungs daily.  Dispense: 60 each; Refill: 5  2. Acute bronchitis with COPD (HCC) - guaiFENesin (MUCINEX) 600 MG 12 hr tablet; Take 2 tablets (1,200 mg total) by mouth 2 (two) times daily.  Dispense: 28 tablet; Refill: 1 - fluticasone furoate-vilanterol (BREO ELLIPTA) 100-25 MCG/INH AEPB; Inhale 1 puff into the lungs daily.  Dispense: 60 each; Refill: 5  Symptoms and presentation appear that she has had COVID-19 starting approximately 2 weeks ago with symptom improvement at this time. I do not feel that testing her at this time would be beneficial at all given that it would not change the plan of care.  Recommend mucinex every 12 hours for cough and increased mucous production. Will send script in for inhaled corticosteroid for COPD management with  acute exacerbation most likely related to post viral bronchitis.  Discussed rest and symptom management.  Discussed warning signs that would warrant immediate evaluation.  Will consider another steroid burst or possible antibiotic therapy if her symptoms have not improved at all by the end of the week. Given her age and medical history, it would not be surprising if she ends up  developing pneumonia- will keep a close eye on her.    Follow-up if symptoms worsen or fail to improve.    I discussed the assessment and treatment plan with the patient. The patient was provided an opportunity to ask questions and all were answered. The patient agreed with the plan and demonstrated an understanding of the instructions.   The patient was advised to call back or seek an in-person evaluation if the symptoms worsen or if the condition fails to improve as anticipated.  I provided 20 minutes of non-face-to-face interaction with this MYCHART visit including intake, same-day documentation, and chart review.   Tollie Eth, NP

## 2020-08-07 NOTE — Patient Instructions (Signed)

## 2020-08-07 NOTE — Progress Notes (Signed)
Pt stated her husband and son has tested positive last week  She have not taking a covid test  She having symptom  Coughing ears hurting no fever tightness of the chess  Took a chest x-ray last week didn' t see anything   Pt is concerned why she is having these symptoms

## 2020-08-10 ENCOUNTER — Encounter: Payer: Self-pay | Admitting: Nurse Practitioner

## 2020-08-21 ENCOUNTER — Telehealth: Payer: Self-pay

## 2020-08-21 NOTE — Telephone Encounter (Signed)
Patient reports still having a lot of congestion in her head. She is having trouble with her ears feeling stopped up.  No popping or crackling in her ears when she blows her nose. She is having trouble with her hearing. Please advise.

## 2020-08-21 NOTE — Telephone Encounter (Signed)
Sounds like she might be at the point of needing an antibiotic possibly for sinusitis.  Lets get her on [place]'sschedule for virtual today

## 2020-08-24 NOTE — Telephone Encounter (Signed)
Spoke with patient. Still having symptoms. Appt scheduled for tomorrow morning to discuss.

## 2020-08-25 ENCOUNTER — Telehealth (INDEPENDENT_AMBULATORY_CARE_PROVIDER_SITE_OTHER): Payer: Medicare HMO | Admitting: Family Medicine

## 2020-08-25 ENCOUNTER — Encounter: Payer: Self-pay | Admitting: Family Medicine

## 2020-08-25 DIAGNOSIS — U071 COVID-19: Secondary | ICD-10-CM

## 2020-08-25 DIAGNOSIS — J440 Chronic obstructive pulmonary disease with acute lower respiratory infection: Secondary | ICD-10-CM

## 2020-08-25 DIAGNOSIS — R059 Cough, unspecified: Secondary | ICD-10-CM

## 2020-08-25 DIAGNOSIS — H938X3 Other specified disorders of ear, bilateral: Secondary | ICD-10-CM | POA: Diagnosis not present

## 2020-08-25 DIAGNOSIS — J40 Bronchitis, not specified as acute or chronic: Secondary | ICD-10-CM | POA: Diagnosis not present

## 2020-08-25 DIAGNOSIS — J329 Chronic sinusitis, unspecified: Secondary | ICD-10-CM | POA: Diagnosis not present

## 2020-08-25 DIAGNOSIS — J209 Acute bronchitis, unspecified: Secondary | ICD-10-CM

## 2020-08-25 MED ORDER — AMOXICILLIN-POT CLAVULANATE 875-125 MG PO TABS: 1.0000 | ORAL_TABLET | Freq: Two times a day (BID) | ORAL | 0 refills | Status: AC

## 2020-08-25 MED ORDER — GUAIFENESIN ER 600 MG PO TB12: 1200.0000 mg | ORAL_TABLET | Freq: Two times a day (BID) | ORAL | 1 refills | Status: DC

## 2020-08-25 NOTE — Progress Notes (Addendum)
Virtual Video Visit via MyChart Note  I connected with  Robin Murillo on 08/25/20 at  9:10 AM EST by the video enabled telemedicine application for MyChart and verified that I am speaking with the correct person using two identifiers.   I introduced myself as a Publishing rights manager with the practice. We discussed the limitations of evaluation and management by telemedicine and the availability of in person appointments. The patient expressed understanding and agreed to proceed.  Participating parties in this visit include: The patient and the nurse practitioner listed.  The patient is: At home I am: In the office  Subjective:    CC: congestion  HPI: Robin Murillo is a 70 y.o. year old female presenting today via MyChart today for ongoing congestion and ear pressure.   Patient was originally treated in November 2021 for URI and treated conservatively. She improved, but then presented on July 21, 2020 for potential COPD exacerbation with azithromycin and steroids. On July 25, 2020 patient was notified of CXR results (hyperinflation) and reported feeling much better. Symptoms later returned and her son and husband tested positive for COVID, though she did not get tested. On August 07, 2020 she had a televisit with this office and was treated like COVID (productive cough, chest tightness, shortness of breath, ear pain, headache, body aches, chills, loss of taste and smell - all of which were already starting to improve at the time of the last visit). She was prescribed Breo Ellipta inhaler and Mucinex at that time with the discussion of considering steroid burst and antibiotic therapy if no improvement.   Today she reports ears are clogged up, head feels stuffy/pressure, headache, some sinus pain maxillary/frontal, still coughing up green mucus, but seems to be improving soon.  She took mucinex for a few days without significant improvement.  Respiratory- cough with green mucus, "my breathing  is not the best" but notes improvement with Breo inhaler, dyspnea with exertion and anxiety, no recent wheezing, feels like breathing may be slightly better than it was last week Sinuses- mild tenderness ethmoid sinuses Ears- bilateral pressure, worse on right, not painful today, but reported they were painful over the past week or two- seemed to improve some with last antibiotic Fevers- none    Past medical history, Surgical history, Family history not pertinant except as noted below, Social history, Allergies, and medications have been entered into the medical record, reviewed, and corrections made.   Review of Systems:  All review of systems negative except what is listed in the HPI   Objective:    General:  Speaking clearly in complete sentences. Absent shortness of breath noted.   Alert and oriented x3.   Normal judgment.  Absent acute distress.   Impression and Recommendations:    1. Sinobronchitis - amoxicillin-clavulanate (AUGMENTIN) 875-125 MG tablet; Take 1 tablet by mouth 2 (two) times daily for 7 days.  Dispense: 14 tablet; Refill: 0 - guaiFENesin (MUCINEX) 600 MG 12 hr tablet; Take 2 tablets (1,200 mg total) by mouth 2 (two) times daily.  Dispense: 28 tablet; Refill: 1  2. Ear pressure, bilateral - amoxicillin-clavulanate (AUGMENTIN) 875-125 MG tablet; Take 1 tablet by mouth 2 (two) times daily for 7 days.  Dispense: 14 tablet; Refill: 0  3. Cough - amoxicillin-clavulanate (AUGMENTIN) 875-125 MG tablet; Take 1 tablet by mouth 2 (two) times daily for 7 days.  Dispense: 14 tablet; Refill: 0 - guaiFENesin (MUCINEX) 600 MG 12 hr tablet; Take 2 tablets (1,200 mg total) by mouth 2 (two) times  daily.  Dispense: 28 tablet; Refill: 1  4. COVID-19 - guaiFENesin (MUCINEX) 600 MG 12 hr tablet; Take 2 tablets (1,200 mg total) by mouth 2 (two) times daily.  Dispense: 28 tablet; Refill: 1  5. Acute bronchitis with COPD (HCC) - amoxicillin-clavulanate (AUGMENTIN) 875-125 MG  tablet; Take 1 tablet by mouth 2 (two) times daily for 7 days.  Dispense: 14 tablet; Refill: 0 - guaiFENesin (MUCINEX) 600 MG 12 hr tablet; Take 2 tablets (1,200 mg total) by mouth 2 (two) times daily.  Dispense: 28 tablet; Refill: 1   Concern for developing sinus infection and potential for bronchitis/pneumonia due to patient's comorbidities and prolonged symptoms despite some brief relief after a z-pack in January. Let's try a different antibiotic that will cover for pneumonia/bronchitis, sinus infection, and ear infection since her symptoms are persisting for so long. Hesitant to do another round of steroids right now, since she just did one about a month ago. We discussed risk vs benefit of oral steroid use, and patient agreeable to wait for now. Continue using  Breo inhaler as that seems to be really helping. Let's also start back on Mucinex (I will reorder what was sent in last time. Hopefully they have some in stock, but you can also check over-the-counter). I also recommend she try to start using her Flonase twice a day as this can help with the congestion in sinuses and ears. It may take awhile before the ears fully resolve.   Please let us know if your symptoms worsen or do not start improving over the next several days. We can reevaluate for need of another round of steroids if symptoms fail to improve.  Follow-up if symptoms worsen or fail to improve.    I discussed the assessment and treatment plan with the patient. The patient was provided an opportunity to ask questions and all were answered. The patient agreed with the plan and demonstrated an understanding of the instructions.   The patient was advised to call back or seek an in-person evaluation if the symptoms worsen or if the condition fails to improve as anticipated.  I provided 20 minutes of non-face-to-face interaction with this MYCHART visit including intake, same-day documentation, and chart review.   Clayborne Dana, NP

## 2020-08-25 NOTE — Patient Instructions (Signed)
It was good to see you today! I'm sorry it is taking you so long to feel better.  Let's try a different antibiotic that will cover for pneumonia/bronchitis, sinus infection, and ear infection since your symptoms are persisting for so long. I would rather not do another round of steroids yet, since you just did one about a month ago. Continue using your Breo inhaler as that seems to be really helping. Let's also start back on Mucinex (I will reorder what was sent in last time. Hopefully they have some in stock, but you can also check over-the-counter). I also recommend you try to start using your Flonase twice a day as this can help with the congestion in your sinuses and ears. It may take awhile before the ears fully resolve. Please let us know if your symptoms worsen or do not start improving over the next several days.

## 2020-09-08 ENCOUNTER — Ambulatory Visit (INDEPENDENT_AMBULATORY_CARE_PROVIDER_SITE_OTHER): Payer: Medicare HMO | Admitting: Family Medicine

## 2020-09-08 ENCOUNTER — Other Ambulatory Visit: Payer: Self-pay | Admitting: General Practice

## 2020-09-08 DIAGNOSIS — Z1231 Encounter for screening mammogram for malignant neoplasm of breast: Secondary | ICD-10-CM | POA: Diagnosis not present

## 2020-09-08 DIAGNOSIS — M8580 Other specified disorders of bone density and structure, unspecified site: Secondary | ICD-10-CM

## 2020-09-08 DIAGNOSIS — Z0000 Encounter for general adult medical examination without abnormal findings: Secondary | ICD-10-CM | POA: Diagnosis not present

## 2020-09-08 DIAGNOSIS — Z1211 Encounter for screening for malignant neoplasm of colon: Secondary | ICD-10-CM

## 2020-09-08 NOTE — Patient Instructions (Addendum)
Bone Density Test A bone density test uses a type of X-ray to measure the amount of calcium and other minerals in a person's bones. It can measure bone density in the hip and the spine. The test is similar to having a regular X-ray. This test may also be called:  Bone densitometry.  Bone mineral density test.  Dual-energy X-ray absorptiometry (DEXA). You may have this test to:  Diagnose a condition that causes weak or thin bones (osteoporosis).  Screen you for osteoporosis.  Predict your risk for a broken bone (fracture).  Determine how well your osteoporosis treatment is working. Tell a health care provider about:  Any allergies you have.  All medicines you are taking, including vitamins, herbs, eye drops, creams, and over-the-counter medicines.  Any problems you or family members have had with anesthetic medicines.  Any blood disorders you have.  Any surgeries you have had.  Any medical conditions you have.  Whether you are pregnant or may be pregnant.  Any medical tests you have had within the past 14 days that used contrast material. What are the risks? Generally, this is a safe test. However, it does expose you to a small amount of radiation, which can slightly increase your cancer risk. What happens before the test?  Do not take any calcium supplements within the 24 hours before your test.  You will need to remove all metal jewelry, eyeglasses, removable dental appliances, and any other metal objects on your body. What happens during the test?  You will lie down on an exam table. There will be an X-ray generator below you and an imaging device above you.  Other devices, such as boxes or braces, may be used to position your body properly for the scan.  The machine will slowly scan your body. You will need to keep very still while the machine does the scan.  The images will show up on a screen in the room. Images will be examined by a specialist after your  test is finished. The procedure may vary among health care providers and hospitals.   What can I expect after the test? It is up to you to get the results of your test. Ask your health care provider, or the department that is doing the test, when your results will be ready. Summary  A bone density test is an imaging test that uses a type of X-ray to measure the amount of calcium and other minerals in your bones.  The test may be used to diagnose or screen you for a condition that causes weak or thin bones (osteoporosis), predict your risk for a broken bone (fracture), or determine how well your osteoporosis treatment is working.  Do not take any calcium supplements within 24 hours before your test.  Ask your health care provider, or the department that is doing the test, when your results will be ready. This information is not intended to replace advice given to you by your health care provider. Make sure you discuss any questions you have with your health care provider. Document Revised: 12/16/2019 Document Reviewed: 12/16/2019 Elsevier Patient Education  2021 [employer].   Colonoscopy, Adult A colonoscopy is a procedure to look at the entire large intestine. This procedure is done using a long, thin, flexible tube that has a camera on the end. You may have a colonoscopy:  As a part of normal colorectal screening.  If you have certain symptoms, such as: ? A low number of red blood cells  in your blood (anemia). ? Diarrhea that does not go away. ? Pain in your abdomen. ? Blood in your stool. A colonoscopy can help screen for and diagnose medical problems, including:  Tumors.  Extra tissue that grows where mucus forms (polyps).  Inflammation.  Areas of bleeding. Tell your health care provider about:  Any allergies you have.  All medicines you are taking, including vitamins, herbs, eye drops, creams, and over-the-counter medicines.  Any problems you or family members have  had with anesthetic medicines.  Any blood disorders you have.  Any surgeries you have had.  Any medical conditions you have.  Any problems you have had with having bowel movements.  Whether you are pregnant or may be pregnant. What are the risks? Generally, this is a safe procedure. However, problems may occur, including:  Bleeding.  Damage to your intestine.  Allergic reactions to medicines given during the procedure.  Infection. This is rare. What happens before the procedure? Eating and drinking restrictions Follow instructions from your health care provider about eating or drinking restrictions, which may include:  A few days before the procedure: ? Follow a low-fiber diet. ? Avoid nuts, seeds, dried fruit, raw fruits, and vegetables.  1-3 days before the procedure: ? Eat only gelatin dessert or ice pops. ? Drink only clear liquids, such as water, clear juice, clear broth or bouillon, black coffee or tea, or clear soft drinks or sports drinks. ? Avoid liquids that contain red or purple dye.  The day of the procedure: ? Do not eat solid foods. You may continue to drink clear liquids until up to 2 hours before the procedure. ? Do not eat or drink anything starting 2 hours before the procedure, or within the time period that your health care provider recommends. Bowel prep If you were prescribed a bowel prep to take by mouth (orally) to clean out your colon:  Take it as told by your health care provider. Starting the day before your procedure, you will need to drink a large amount of liquid medicine. The liquid will cause you to have many bowel movements of loose stool until your stool becomes almost clear or light green.  If your skin or the opening between the buttocks (anus) gets irritated from diarrhea, you may relieve the irritation using: ? Wipes with medicine in them, such as adult wet wipes with aloe and vitamin E. ? A product to soothe skin, such as petroleum  jelly.  If you vomit while drinking the bowel prep: ? Take a break for up to 60 minutes. ? Begin the bowel prep again. ? Call your health care provider if you keep vomiting or you cannot take the bowel prep without vomiting.  To clean out your colon, you may also be given: ? Laxative medicines. These help you have a bowel movement. ? Instructions for enema use. An enema is liquid medicine injected into your rectum. Medicines Ask your health care provider about:  Changing or stopping your regular medicines or supplements. This is especially important if you are taking iron supplements, diabetes medicines, or blood thinners.  Taking medicines such as aspirin and ibuprofen. These medicines can thin your blood. Do not take these medicines unless your health care provider tells you to take them.  Taking over-the-counter medicines, vitamins, herbs, and supplements. General instructions  Ask your health care provider what steps will be taken to help prevent infection. These may include washing skin with a germ-killing soap.  Plan to have someone take  you home from the hospital or clinic. What happens during the procedure?  An IV will be inserted into one of your veins.  You may be given one or more of the following: ? A medicine to help you relax (sedative). ? A medicine to numb the area (local anesthetic). ? A medicine to make you fall asleep (general anesthetic). This is rarely needed.  You will lie on your side with your knees bent.  The tube will: ? Have oil or gel put on it (be lubricated). ? Be inserted into your anus. ? Be gently eased through all parts of your large intestine.  Air will be sent into your colon to keep it open. This may cause some pressure or cramping.  Images will be taken with the camera and will appear on a screen.  A small tissue sample may be removed to be looked at under a microscope (biopsy). The tissue may be sent to a lab for testing if any signs  of problems are found.  If small polyps are found, they may be removed and checked for cancer cells.  When the procedure is finished, the tube will be removed. The procedure may vary among health care providers and hospitals.   What happens after the procedure?  Your blood pressure, heart rate, breathing rate, and blood oxygen level will be monitored until you leave the hospital or clinic.  You may have a small amount of blood in your stool.  You may pass gas and have mild cramping or bloating in your abdomen. This is caused by the air that was used to open your colon during the exam.  Do not drive for 24 hours after the procedure.  It is up to you to get the results of your procedure. Ask your health care provider, or the department that is doing the procedure, when your results will be ready. Summary  A colonoscopy is a procedure to look at the entire large intestine.  Follow instructions from your health care provider about eating and drinking before the procedure.  If you were prescribed an oral bowel prep to clean out your colon, take it as told by your health care provider.  During the colonoscopy, a flexible tube with a camera on its end is inserted into the anus and then passed into the other parts of the large intestine. This information is not intended to replace advice given to you by your health care provider. Make sure you discuss any questions you have with your health care provider. Document Revised: 01/22/2019 Document Reviewed: 01/22/2019 Elsevier Patient Education  2021 [employer]   Fall Prevention in the Home, Adult Falls can cause injuries and can happen to people of all ages. There are many things you can do to make your home safe and to help prevent falls. Ask for help when making these changes. What actions can I take to prevent falls? General Instructions  Use good lighting in all rooms. Replace any light bulbs that burn out.  Turn on the lights in  dark areas. Use night-lights.  Keep items that you use often in easy-to-reach places. Lower the shelves around your home if needed.  Set up your furniture so you have a clear path. Avoid moving your furniture around.  Do not have throw rugs or other things on the floor that can make you trip.  Avoid walking on wet floors.  If any of your floors are uneven, fix them.  Add color or contrast paint or tape  to clearly mark and help you see: ? Grab bars or handrails. ? First and last steps of staircases. ? Where the edge of each step is.  If you use a stepladder: ? Make sure that it is fully opened. Do not climb a closed stepladder. ? Make sure the sides of the stepladder are locked in place. ? Ask someone to hold the stepladder while you use it.  Know where your pets are when moving through your home. What can I do in the bathroom?  Keep the floor dry. Clean up any water on the floor right away.  Remove soap buildup in the tub or shower.  Use nonskid mats or decals on the floor of the tub or shower.  Attach bath mats securely with double-sided, nonslip rug tape.  If you need to sit down in the shower, use a plastic, nonslip stool.  Install grab bars by the toilet and in the tub and shower. Do not use towel bars as grab bars.      What can I do in the bedroom?  Make sure that you have a light by your bed that is easy to reach.  Do not use any sheets or blankets for your bed that hang to the floor.  Have a firm chair with side arms that you can use for support when you get dressed. What can I do in the kitchen?  Clean up any spills right away.  If you need to reach something above you, use a step stool with a grab bar.  Keep electrical cords out of the way.  Do not use floor polish or wax that makes floors slippery. What can I do with my stairs?  Do not leave any items on the stairs.  Make sure that you have a light switch at the top and the bottom of the  stairs.  Make sure that there are handrails on both sides of the stairs. Fix handrails that are broken or loose.  Install nonslip stair treads on all your stairs.  Avoid having throw rugs at the top or bottom of the stairs.  Choose a carpet that does not hide the edge of the steps on the stairs.  Check carpeting to make sure that it is firmly attached to the stairs. Fix carpet that is loose or worn. What can I do on the outside of my home?  Use bright outdoor lighting.  Fix the edges of walkways and driveways and fix any cracks.  Remove anything that might make you trip as you walk through a door, such as a raised step or threshold.  Trim any bushes or trees on paths to your home.  Check to see if handrails are loose or broken and that both sides of all steps have handrails.  Install guardrails along the edges of any raised decks and porches.  Clear paths of anything that can make you trip, such as tools or rocks.  Have leaves, snow, or ice cleared regularly.  Use sand or salt on paths during winter.  Clean up any spills in your garage right away. This includes grease or oil spills. What other actions can I take?  Wear shoes that: ? Have a low heel. Do not wear high heels. ? Have rubber bottoms. ? Feel good on your feet and fit well. ? Are closed at the toe. Do not wear open-toe sandals.  Use tools that help you move around if needed. These include: ? Canes. ? Walkers. ? Scooters. ? Crutches.  Review your medicines with your doctor. Some medicines can make you feel dizzy. This can increase your chance of falling. Ask your doctor what else you can do to help prevent falls. Where to find more information  Centers for Disease Control and Prevention, [employer]: FootballExhibition.com.br  [employer] on Aging: https://walker.com/ Contact a doctor if:  You are afraid of falling at home.  You feel weak, drowsy, or dizzy at home.  You fall at home. Summary  There are many  simple things that you can do to make your home safe and to help prevent falls.  Ways to make your home safe include removing things that can make you trip and installing grab bars in the bathroom.  Ask for help when making these changes in your home. This information is not intended to replace advice given to you by your health care provider. Make sure you discuss any questions you have with your health care provider. Document Revised: 02/02/2020 Document Reviewed: 02/02/2020 Elsevier Patient Education  2021 [employer] Inc.   Health Maintenance, Female Adopting a healthy lifestyle and getting preventive care are important in promoting health and wellness. Ask your health care provider about:  The right schedule for you to have regular tests and exams.  Things you can do on your own to prevent diseases and keep yourself healthy. What should I know about diet, weight, and exercise? Eat a healthy diet  Eat a diet that includes plenty of vegetables, fruits, low-fat dairy products, and lean protein.  Do not eat a lot of foods that are high in solid fats, added sugars, or sodium.   Maintain a healthy weight Body mass index (BMI) is used to identify weight problems. It estimates body fat based on height and weight. Your health care provider can help determine your BMI and help you achieve or maintain a healthy weight. Get regular exercise Get regular exercise. This is one of the most important things you can do for your health. Most adults should:  Exercise for at least 150 minutes each week. The exercise should increase your heart rate and make you sweat (moderate-intensity exercise).  Do strengthening exercises at least twice a week. This is in addition to the moderate-intensity exercise.  Spend less time sitting. Even light physical activity can be beneficial. Watch cholesterol and blood lipids Have your blood tested for lipids and cholesterol at 70 years of age, then have this test  every 5 years. Have your cholesterol levels checked more often if:  Your lipid or cholesterol levels are high.  You are older than 70 years of age.  You are at high risk for heart disease. What should I know about cancer screening? Depending on your health history and family history, you may need to have cancer screening at various ages. This may include screening for:  Breast cancer.  Cervical cancer.  Colorectal cancer.  Skin cancer.  Lung cancer. What should I know about heart disease, diabetes, and high blood pressure? Blood pressure and heart disease  High blood pressure causes heart disease and increases the risk of stroke. This is more likely to develop in people who have high blood pressure readings, are of African descent, or are overweight.  Have your blood pressure checked: ? Every 3-5 years if you are 103-96 years of age. ? Every year if you are 31 years old or older. Diabetes Have regular diabetes screenings. This checks your fasting blood sugar level. Have the screening done:  Once every three years after age 56  if you are at a normal weight and have a low risk for diabetes.  More often and at a younger age if you are overweight or have a high risk for diabetes. What should I know about preventing infection? Hepatitis B If you have a higher risk for hepatitis B, you should be screened for this virus. Talk with your health care provider to find out if you are at risk for hepatitis B infection. Hepatitis C Testing is recommended for:  Everyone born from [birth year] through 1965.  Anyone with known risk factors for hepatitis C. Sexually transmitted infections (STIs)  Get screened for STIs, including gonorrhea and chlamydia, if: ? You are sexually active and are younger than 70 years of age. ? You are older than 70 years of age and your health care provider tells you that you are at risk for this type of infection. ? Your sexual activity has changed since you were  last screened, and you are at increased risk for chlamydia or gonorrhea. Ask your health care provider if you are at risk.  Ask your health care provider about whether you are at high risk for HIV. Your health care provider may recommend a prescription medicine to help prevent HIV infection. If you choose to take medicine to prevent HIV, you should first get tested for HIV. You should then be tested every 3 months for as long as you are taking the medicine. Pregnancy  If you are about to stop having your period (premenopausal) and you may become pregnant, seek counseling before you get pregnant.  Take 400 to 800 micrograms (mcg) of folic acid every day if you become pregnant.  Ask for birth control (contraception) if you want to prevent pregnancy. Osteoporosis and menopause Osteoporosis is a disease in which the bones lose minerals and strength with aging. This can result in bone fractures. If you are 81 years old or older, or if you are at risk for osteoporosis and fractures, ask your health care provider if you should:  Be screened for bone loss.  Take a calcium or vitamin D supplement to lower your risk of fractures.  Be given hormone replacement therapy (HRT) to treat symptoms of menopause. Follow these instructions at home: Lifestyle  Do not use any products that contain nicotine or tobacco, such as cigarettes, e-cigarettes, and chewing tobacco. If you need help quitting, ask your health care provider.  Do not use street drugs.  Do not share needles.  Ask your health care provider for help if you need support or information about quitting drugs. Alcohol use  Do not drink alcohol if: ? Your health care provider tells you not to drink. ? You are pregnant, may be pregnant, or are planning to become pregnant.  If you drink alcohol: ? Limit how much you use to 0-1 drink a day. ? Limit intake if you are breastfeeding.  Be aware of how much alcohol is in your drink. In the [place]  one drink equals one 12 oz bottle of beer (355 mL), one 5 oz glass of wine (148 mL), or one 1 oz glass of hard liquor (44 mL). General instructions  Schedule regular health, dental, and eye exams.  Stay current with your vaccines.  Tell your health care provider if: ? You often feel depressed. ? You have ever been abused or do not feel safe at home. Summary  Adopting a healthy lifestyle and getting preventive care are important in promoting health and wellness.  Follow your health care  provider's instructions about healthy diet, exercising, and getting tested or screened for diseases.  Follow your health care provider's instructions on monitoring your cholesterol and blood pressure. This information is not intended to replace advice given to you by your health care provider. Make sure you discuss any questions you have with your health care provider. Document Revised: 06/24/2018 Document Reviewed: 06/24/2018 Elsevier Patient Education  2021 Elsevier Inc.   Mammogram A mammogram is a low energy X-ray of the breasts that is done to check for abnormal changes. This procedure can screen for and detect any changes that may indicate breast cancer. Mammograms are regularly done on women. A man may have a mammogram if he has a lump or swelling in his breast. A mammogram can also identify other changes and variations in the breast, such as:  Inflammation of the breast tissue (mastitis).  An infected area that contains a collection of pus (abscess).  A fluid-filled sac (cyst).  Fibrocystic changes. This is when breast tissue becomes denser, which can make the tissue feel rope-like or uneven under the skin.  Tumors that are not cancerous (benign). Tell a health care provider:  About any allergies you have.  If you have breast implants.  If you have had previous breast disease, biopsy, or surgery.  If you are breastfeeding.  If you are younger than age 63.  If you have a family  history of breast cancer.  Whether you are pregnant or may be pregnant. What are the risks? Generally, this is a safe procedure. However, problems may occur, including:  Exposure to radiation. Radiation levels are very low with this test.  The results being misinterpreted.  The need for further tests.  The inability of the mammogram to detect certain cancers. What happens before the procedure?  Schedule your test about 1-2 weeks after your menstrual period if you are still menstruating. This is usually when your breasts are the least tender.  If you have had a mammogram done at a different facility in the past, get the mammogram X-rays or have them sent to your current exam facility. The new and old images will be compared.  Wash your breasts and underarms on the day of the test.  Do not wear deodorants, perfumes, lotions, or powders anywhere on your body on the day of the test.  Remove any jewelry from your neck.  Wear clothes that you can change into and out of easily. What happens during the procedure?  You will undress from the waist up and put on a gown that opens in the front.  You will stand in front of the X-ray machine.  Each breast will be placed between two plastic or glass plates. The plates will compress your breast for a few seconds. Try to stay as relaxed as possible during the procedure. This does not cause any harm to your breasts and any discomfort you feel will be very brief.  X-rays will be taken from different angles of each breast. The procedure may vary among health care providers and hospitals.   What happens after the procedure?  The mammogram will be examined by a specialist (radiologist).  You may need to repeat certain parts of the test, depending on the quality of the images. This is commonly done if the radiologist needs a better view of the breast tissue.  You may resume your normal activities.  It is up to you to get the results of your  procedure. Ask your health care provider, or the  department that is doing the procedure, when your results will be ready. Summary  A mammogram is a low energy X-ray of the breasts that is done to check for abnormal changes. A man may have a mammogram if he has a lump or swelling in his breast.  If you have had a mammogram done at a different facility in the past, get the mammogram X-rays or have them sent to your current exam facility in order to compare them.  Schedule your test about 1-2 weeks after your menstrual period if you are still menstruating.  For this test, each breast will be placed between two plastic or glass plates. The plates will compress your breast for a few seconds.  Ask when your test results will be ready. Make sure you get your test results. This information is not intended to replace advice given to you by your health care provider. Make sure you discuss any questions you have with your health care provider. Document Revised: 02/19/2018 Document Reviewed: 02/19/2018 Elsevier Patient Education  2021 Elsevier Inc.    MEDICARE [place] VISIT Health Maintenance Summary and Written Plan of Care  Ms. Claudette Laws ,  Thank you for allowing me to perform your Medicare Annual Wellness Visit and for your ongoing commitment to your health.   Health Maintenance & Immunization History Health Maintenance  Topic Date Due  . PNA vac Low Risk Adult (2 of 2 - PPSV23) 04/21/2018  . TETANUS/TDAP  05/10/2018  . Fecal DNA (Cologuard)  06/29/2018  . COVID-19 Vaccine (3 - Booster for Pfizer series) 02/24/2020  . HEMOGLOBIN A1C  03/18/2020  . DEXA SCAN  05/13/2020  . FOOT EXAM  05/18/2020  . OPHTHALMOLOGY EXAM  08/02/2020  . MAMMOGRAM  05/18/2021  . INFLUENZA VACCINE  Completed  . Hepatitis C Screening  Completed   Immunization History  Administered Date(s) Administered  . Fluad Quad(high Dose 65+) 05/04/2019, 05/29/2020  . Influenza, High Dose Seasonal PF 04/24/2017,  04/02/2018  . Influenza,inj,Quad PF,6+ Mos 04/27/2014, 05/05/2015, 04/19/2016  . Influenza-Unspecified 04/08/2013, 05/04/2019, 05/29/2020  . PFIZER(Purple Top)SARS-COV-2 Vaccination 08/10/2019, 08/27/2019  . Pneumococcal Conjugate-13 01/15/2016  . Pneumococcal Polysaccharide-23 04/21/2013  . Td 05/10/2008  . Tdap 05/10/2008  . Zoster 10/01/2012    These are the patient goals that we discussed: Goals Addressed              This Visit's Progress   .  Patient Stated (pt-stated)        09/08/2020 AWV Goal: Exercise for General Health   Patient will verbalize understanding of the benefits of increased physical activity:  Exercising regularly is important. It will improve your overall fitness, flexibility, and endurance.  Regular exercise also will improve your overall health. It can help you control your weight, reduce stress, and improve your bone density.  Over the next year, patient will increase physical activity as tolerated with a goal of at least 150 minutes of moderate physical activity per week.   You can tell that you are exercising at a moderate intensity if your heart starts beating faster and you start breathing faster but can still hold a conversation.  Moderate-intensity exercise ideas include:  Walking 1 mile (1.6 km) in about 15 minutes  Biking  Hiking  Golfing  Dancing  Water aerobics  Patient will verbalize understanding of everyday activities that increase physical activity by providing examples like the following: ? Yard work, such as: ? Pushing a Surveyor, mining ? Raking and bagging leaves ? Washing your car ?  Pushing a stroller ? Shoveling snow ? Gardening ? Washing windows or floors  Patient will be able to explain general safety guidelines for exercising:   Before you start a new exercise program, talk with your health care provider.  Do not exercise so much that you hurt yourself, feel dizzy, or get very short of breath.  Wear comfortable  clothes and wear shoes with good support.  Drink plenty of water while you exercise to prevent dehydration or heat stroke.  Work out until your breathing and your heartbeat get faster.         This is a list of Health Maintenance Items that are overdue or due now: Health Maintenance Due  Topic Date Due  . PNA vac Low Risk Adult (2 of 2 - PPSV23) 04/21/2018  . TETANUS/TDAP  05/10/2018  . Fecal DNA (Cologuard)  06/29/2018  . COVID-19 Vaccine (3 - Booster for Pfizer series) 02/24/2020  . HEMOGLOBIN A1C  03/18/2020  . DEXA SCAN  05/13/2020  . FOOT EXAM  05/18/2020  . OPHTHALMOLOGY EXAM  08/02/2020     Orders/Referrals Placed Today: Dexa scan Mammogram in November Cologuard    Follow-up Plan . Follow-up with Agapito Games, MD as planned for your wrist pain, pnuemovax, foot exam and your hemoglobin a1c. . Schedule your appointment at the pharmacy for your tdap, covid booster and shingrix.  . Schedule your eye exam and your hearing exam.

## 2020-09-08 NOTE — Progress Notes (Signed)
MEDICARE ANNUAL WELLNESS VISIT  09/08/2020  Telephone Visit Disclaimer This Medicare AWV was conducted by telephone due to national recommendations for restrictions regarding the COVID-19 Pandemic (e.g. social distancing).  I verified, using two identifiers, that I am speaking with Robin Murillo or their authorized healthcare agent. I discussed the limitations, risks, security, and privacy concerns of performing an evaluation and management service by telephone and the potential availability of an in-person appointment in the future. The patient expressed understanding and agreed to proceed.  Location of Patient: Home Location of Provider (nurse):  In the office.  Subjective:    Robin Murillo is a 70 y.o. female patient of Metheney, Barbarann Ehlers, MD who had a Medicare Annual Wellness Visit today via telephone. Robin Murillo is Retired and Disabled and lives with their spouse and one of her children. she has 3 children. she reports that she is socially active and does interact with friends/family regularly. she is minimally physically active and enjoys reading, playing games on her tablet and doing word search.  Patient Care Team: Agapito Games, MD as PCP - General (Family Medicine) Randolm Idol, MD as Referring Physician (Pulmonary Disease) Louie Bun, MD as Referring Physician (Cardiology)  Advanced Directives 09/08/2020 05/12/2019 10/12/2014  Does Patient Have a Medical Advance Directive? No No No  Would patient like information on creating a medical advance directive? No - Patient declined No - Patient declined Yes - Educational materials given    Hospital Utilization Over the Past 12 Months: # of hospitalizations or ER visits: 1 # of surgeries: 0  Review of Systems    Patient reports that her overall health is better compared to last year.  History obtained from chart review and the patient  Patient Reported Readings (BP, Pulse, CBG, Weight, etc) none  Pain  Assessment Pain : No/denies pain     Current Medications & Allergies (verified) Allergies as of 09/08/2020      Reactions   Morphine And Related Swelling   Swelling in face      Medication List       Accurate as of September 08, 2020  9:25 AM. If you have any questions, ask your nurse or doctor.        albuterol 108 (90 Base) MCG/ACT inhaler Commonly known as: Ventolin HFA Inhale 2 puffs into the lungs every 6 (six) hours as needed for wheezing or shortness of breath.   ALPRAZolam 0.5 MG tablet Commonly known as: XANAX Take 1 tablet (0.5 mg total) by mouth daily as needed for anxiety.   aspirin 81 MG tablet Take 81 mg by mouth daily.   atorvastatin 20 MG tablet Commonly known as: LIPITOR TAKE 1 TABLET (20 MG) BY MOUTH NIGHTLY   Breo Ellipta 100-25 MCG/INH Aepb Generic drug: fluticasone furoate-vilanterol Inhale 1 puff into the lungs daily.   FLUoxetine 20 MG capsule Commonly known as: PROZAC TAKE 3 CAPSULES EVERY DAY   guaiFENesin 600 MG 12 hr tablet Commonly known as: Mucinex Take 2 tablets (1,200 mg total) by mouth 2 (two) times daily.   levothyroxine 125 MCG tablet Commonly known as: SYNTHROID TAKE 1 TABLET EVERY DAY   lisinopril 5 MG tablet Commonly known as: ZESTRIL TAKE 1 TABLET (5 MG TOTAL) BY MOUTH DAILY.   magnesium oxide 400 MG tablet Commonly known as: MAG-OX Take 400 mg by mouth 2 (two) times daily.   metFORMIN 500 MG 24 hr tablet Commonly known as: GLUCOPHAGE-XR TAKE 1 TABLET BY MOUTH EVERY DAY WITH BREAKFAST   metoprolol  tartrate 50 MG tablet Commonly known as: LOPRESSOR Take 50 mg by mouth 2 (two) times daily.   oxybutynin 5 MG tablet Commonly known as: DITROPAN Take 1 tablet (5 mg total) by mouth 2 (two) times daily.   pantoprazole 40 MG tablet Commonly known as: PROTONIX Take 1 tablet (40 mg total) by mouth daily.   VITAMIN D PO Take 2,000 Int'l Units by mouth daily.       History (reviewed): Past Medical History:   Diagnosis Date  . Heart disease    pacemaker/defiberlator  . Thyroid disease    Past Surgical History:  Procedure Laterality Date  . CARDIAC DEFIBRILLATOR PLACEMENT     06/2010 at [facility]  . CARDIAC ELECTROPHYSIOLOGY STUDY AND ABLATION     06/2010  . PACEMAKER PLACEMENT     06/2010 at [facility]   Family History  Problem Relation Age of Onset  . Heart attack Brother   . Heart disease Sister        Murmur  . Thyroid disease Sister   . Diabetes Brother    Social History   Socioeconomic History  . Marital status: Married    Spouse name: Maurine Minister  . Number of children: 3  . Years of education: 37  . Highest education level: 12th grade  Occupational History  . Occupation: disability    Comment: office work  Tobacco Use  . Smoking status: Former Smoker    Quit date: 04/04/2003    Years since quitting: 17.4  . Smokeless tobacco: Never Used  Vaping Use  . Vaping Use: Never used  Substance and Sexual Activity  . Alcohol use: No  . Drug use: No  . Sexual activity: Not Currently  Other Topics Concern  . Not on file  Social History Narrative   1 caffeine dirink per day. No regular exercise.    Social Determinants of Health   Financial Resource Strain: Low Risk   . Difficulty of Paying Living Expenses: Not hard at all  Food Insecurity: No Food Insecurity  . Worried About Programme researcher, broadcasting/film/video in the Last Year: Never true  . Ran Out of Food in the Last Year: Never true  Transportation Needs: No Transportation Needs  . Lack of Transportation (Medical): No  . Lack of Transportation (Non-Medical): No  Physical Activity: Inactive  . Days of Exercise per Week: 0 days  . Minutes of Exercise per Session: 0 min  Stress: No Stress Concern Present  . Feeling of Stress : Not at all  Social Connections: Moderately Isolated  . Frequency of Communication with Friends and Family: Twice a week  . Frequency of Social Gatherings with Friends and Family: More than three times a  week  . Attends Religious Services: Never  . Active Member of Clubs or Organizations: No  . Attends Banker Meetings: Never  . Marital Status: Married    Activities of Daily Living In your present state of health, do you have any difficulty performing the following activities: 09/08/2020  Hearing? Y  Comment she has felt that she needs to have her hearing checked since her last cold.  Vision? Y  Comment feels that she needs an eye exam. she is going to call and schedule it.  Difficulty concentrating or making decisions? N  Walking or climbing stairs? N  Dressing or bathing? N  Doing errands, shopping? N  Preparing Food and eating ? N  Using the Toilet? N  In the past six months, have you accidently  leaked urine? N  Do you have problems with loss of bowel control? N  Managing your Medications? N  Managing your Finances? N  Housekeeping or managing your Housekeeping? N  Some recent data might be hidden    Patient Education/ Literacy How often do you need to have someone help you when you read instructions, pamphlets, or other written materials from your doctor or pharmacy?: 1 - Never What is the last grade level you completed in school?: GED  Exercise Current Exercise Habits: The patient does not participate in regular exercise at present, Exercise limited by: respiratory conditions(s);orthopedic condition(s)  Diet Patient reports consuming 2 meals a day and 1 snack(s) a day Patient reports that her primary diet is: Regular Patient reports that she does have regular access to food.   Depression Screen PHQ 2/9 Scores 09/08/2020 08/25/2020 05/19/2019 05/12/2019 04/02/2018 07/29/2017 04/24/2017  PHQ - 2 Score 0 PHQ- 9 Score 1 - 11 - Fall Risk Fall Risk  09/08/2020 09/16/2019 05/19/2019 05/12/2019 04/02/2018  Falls in the past year? 0 Yes  Number falls in past yr: 0 0 or more  Injury with Fall? 0 1 0 0 Yes  Risk Factor Category  - - - -  High Fall Risk  Risk for fall due to : No Fall Risks Other (Comment) Other (Comment) History of fall(s);Impaired balance/gait Impaired balance/gait  Risk for fall due to: Comment - missed step - - -  Follow up Falls evaluation completed Falls prevention discussed - Falls prevention discussed Falls prevention discussed     Objective:  Kaydan Wilhoite seemed alert and oriented and she participated appropriately during our telephone visit.  Blood Pressure Weight BMI  BP Readings from Last 3 Encounters:  09/16/19 (!) 117/51  05/19/19 (!) 126/57  05/12/19 (!) 142/76   Wt Readings from Last 3 Encounters:  06/02/20 240 lb (108.9 kg)  09/16/19 240 lb (108.9 kg)  05/19/19 240 lb (108.9 kg)   BMI Readings from Last 1 Encounters:  06/02/20 42.51 kg/m    *Unable to obtain current vital signs, weight, and BMI due to telephone visit type  Hearing/Vision  . Shaketha did not seem to have difficulty with hearing/understanding during the telephone conversation . Reports that she has not had a formal eye exam by an eye care professional within the past year . Reports that she has not had a formal hearing evaluation within the past year *Unable to fully assess hearing and vision during telephone visit type  Cognitive Function: 6CIT Screen 09/08/2020 05/12/2019  What Year? 0 points 0 points  What month? 0 points 0 points  What time? 0 points 0 points  Count back from 20 0 points 0 points  Months in reverse 0 points 0 points  Repeat phrase 0 points 0 points  Total Score 0 0   (Normal:0-7, Significant for Dysfunction: >8)  Normal Cognitive Function Screening: Yes   Immunization & Health Maintenance Record Immunization History  Administered Date(s) Administered  . Fluad Quad(high Dose 65+) 05/04/2019, 05/29/2020  . Influenza, High Dose Seasonal PF 04/24/2017, 04/02/2018  . Influenza,inj,Quad PF,6+ Mos 04/27/2014, 05/05/2015, 04/19/2016  . Influenza-Unspecified 04/08/2013, 05/04/2019, 05/29/2020   . PFIZER(Purple Top)SARS-COV-2 Vaccination 08/10/2019, 08/27/2019  . Pneumococcal Conjugate-13 01/15/2016  . Pneumococcal Polysaccharide-23 04/21/2013  . Td 05/10/2008  . Tdap 05/10/2008  . Zoster 10/01/2012    Health Maintenance  Topic Date Due  . PNA vac  Low Risk Adult (2 of 2 - PPSV23) 04/21/2018  . TETANUS/TDAP  05/10/2018  . Fecal DNA (Cologuard)  06/29/2018  . COVID-19 Vaccine (3 - Booster for Pfizer series) 02/24/2020  . HEMOGLOBIN A1C  03/18/2020  . DEXA SCAN  05/13/2020  . FOOT EXAM  05/18/2020  . OPHTHALMOLOGY EXAM  08/02/2020  . MAMMOGRAM  05/18/2021  . INFLUENZA VACCINE  Completed  . Hepatitis C Screening  Completed       Assessment  This is a routine wellness examination for Robin Murillo.  Health Maintenance: Due or Overdue Health Maintenance Due  Topic Date Due  . PNA vac Low Risk Adult (2 of 2 - PPSV23) 04/21/2018  . TETANUS/TDAP  05/10/2018  . Fecal DNA (Cologuard)  06/29/2018  . COVID-19 Vaccine (3 - Booster for Pfizer series) 02/24/2020  . HEMOGLOBIN A1C  03/18/2020  . DEXA SCAN  05/13/2020  . FOOT EXAM  05/18/2020  . OPHTHALMOLOGY EXAM  08/02/2020    Robin Murillo does not need a referral for Community Assistance: Care Management:   no Social Work:    no Prescription Assistance:  no Nutrition/Diabetes Education:  no   Plan:  Personalized Goals Goals Addressed              This Visit's Progress   .  Patient Stated (pt-stated)        09/08/2020 AWV Goal: Exercise for General Health   Patient will verbalize understanding of the benefits of increased physical activity:  Exercising regularly is important. It will improve your overall fitness, flexibility, and endurance.  Regular exercise also will improve your overall health. It can help you control your weight, reduce stress, and improve your bone density.  Over the next year, patient will increase physical activity as tolerated with a goal of at least 150 minutes of moderate physical  activity per week.   You can tell that you are exercising at a moderate intensity if your heart starts beating faster and you start breathing faster but can still hold a conversation.  Moderate-intensity exercise ideas include:  Walking 1 mile (1.6 km) in about 15 minutes  Biking  Hiking  Golfing  Dancing  Water aerobics  Patient will verbalize understanding of everyday activities that increase physical activity by providing examples like the following: ? Yard work, such as: ? Pushing a Surveyor, mining ? Raking and bagging leaves ? Washing your car ? Pushing a stroller ? Shoveling snow ? Gardening ? Washing windows or floors  Patient will be able to explain general safety guidelines for exercising:   Before you start a new exercise program, talk with your health care provider.  Do not exercise so much that you hurt yourself, feel dizzy, or get very short of breath.  Wear comfortable clothes and wear shoes with good support.  Drink plenty of water while you exercise to prevent dehydration or heat stroke.  Work out until your breathing and your heartbeat get faster.       Personalized Health Maintenance & Screening Recommendations  Pneumococcal vaccine  Td vaccine Bone densitometry screening Colorectal cancer screening Diabetes screening Glaucoma screening Shingrix  Covid Booster  Lung Cancer Screening Recommended: no (Low Dose CT Chest recommended if Age 8-80 years, 30 pack-year currently smoking OR have quit w/in past 15 years) Hepatitis C Screening recommended: no HIV Screening recommended: no  Advanced Directives: Written information was not prepared per patient's request.  Referrals & Orders Dexa scan Mammogram in November Cologuard   Follow-up Plan . Follow-up with  Agapito GamesMetheney, Catherine D, MD as planned for your wrist pain, pnuemovax, foot exam and your hemoglobin a1c. . Schedule your appointment at the pharmacy for your tdap, covid booster and  shingrix.  . Schedule your eye exam and your hearing exam.   I have personally reviewed and noted the following in the patient's chart:   . Medical and social history . Use of alcohol, tobacco or illicit drugs  . Current medications and supplements . Functional ability and status . Nutritional status . Physical activity . Advanced directives . List of other physicians . Hospitalizations, surgeries, and ER visits in previous 12 months . Vitals . Screenings to include cognitive, depression, and falls . Referrals and appointments  In addition, I have reviewed and discussed with Robin HesselbachEunice Aguas certain preventive protocols, quality metrics, and best practice recommendations. A written personalized care plan for preventive services as well as general preventive health recommendations is available and can be mailed to the patient at her request.      Modesto CharonBableen  Kaur, RN  09/08/2020

## 2020-09-08 NOTE — Progress Notes (Signed)
Medical screening examination/treatment was performed by qualified clinical staff member and as supervising physician I was immediately available for consultation/collaboration. I have reviewed documentation and agree with assessment and plan.  Jaeliana Lococo, MD  

## 2020-09-09 ENCOUNTER — Other Ambulatory Visit: Payer: Self-pay | Admitting: Family Medicine

## 2020-09-09 DIAGNOSIS — K2100 Gastro-esophageal reflux disease with esophagitis, without bleeding: Secondary | ICD-10-CM

## 2020-09-20 ENCOUNTER — Telehealth: Payer: Self-pay

## 2020-09-20 ENCOUNTER — Other Ambulatory Visit: Payer: Self-pay | Admitting: Family Medicine

## 2020-09-20 DIAGNOSIS — E559 Vitamin D deficiency, unspecified: Secondary | ICD-10-CM

## 2020-09-20 DIAGNOSIS — E039 Hypothyroidism, unspecified: Secondary | ICD-10-CM

## 2020-09-20 DIAGNOSIS — Z1211 Encounter for screening for malignant neoplasm of colon: Secondary | ICD-10-CM | POA: Diagnosis not present

## 2020-09-20 DIAGNOSIS — Z0000 Encounter for general adult medical examination without abnormal findings: Secondary | ICD-10-CM

## 2020-09-20 DIAGNOSIS — E119 Type 2 diabetes mellitus without complications: Secondary | ICD-10-CM

## 2020-09-20 NOTE — Telephone Encounter (Signed)
Eda called for lab orders. Orders placed.

## 2020-09-21 ENCOUNTER — Encounter: Payer: Self-pay | Admitting: Family Medicine

## 2020-09-21 ENCOUNTER — Ambulatory Visit (INDEPENDENT_AMBULATORY_CARE_PROVIDER_SITE_OTHER): Payer: Medicare HMO | Admitting: Family Medicine

## 2020-09-21 ENCOUNTER — Other Ambulatory Visit: Payer: Self-pay

## 2020-09-21 VITALS — BP 138/56 | HR 71 | Ht 63.0 in | Wt 241.0 lb

## 2020-09-21 DIAGNOSIS — Z0000 Encounter for general adult medical examination without abnormal findings: Secondary | ICD-10-CM | POA: Diagnosis not present

## 2020-09-21 DIAGNOSIS — E039 Hypothyroidism, unspecified: Secondary | ICD-10-CM

## 2020-09-21 DIAGNOSIS — E119 Type 2 diabetes mellitus without complications: Secondary | ICD-10-CM

## 2020-09-21 DIAGNOSIS — Z23 Encounter for immunization: Secondary | ICD-10-CM

## 2020-09-21 DIAGNOSIS — E559 Vitamin D deficiency, unspecified: Secondary | ICD-10-CM | POA: Diagnosis not present

## 2020-09-21 DIAGNOSIS — F418 Other specified anxiety disorders: Secondary | ICD-10-CM

## 2020-09-21 LAB — POCT GLYCOSYLATED HEMOGLOBIN (HGB A1C): Hemoglobin A1C: 4.8 % (ref 4.0–5.6)

## 2020-09-21 LAB — POCT UA - MICROALBUMIN
Albumin/Creatinine Ratio, Urine, POC: <30
Creatinine, POC: 200 mg/dL
Microalbumin Ur, POC: 30 mg/L

## 2020-09-21 MED ORDER — ZOSTER VAC RECOMB ADJUVANTED 50 MCG/0.5ML IM SUSR: 0.5000 mL | Freq: Once | INTRAMUSCULAR | 0 refills | Status: AC

## 2020-09-21 MED ORDER — TETANUS-DIPHTH-ACELL PERTUSSIS 5-2.5-18.5 LF-MCG/0.5 IM SUSP: 0.5000 mL | Freq: Once | INTRAMUSCULAR | 0 refills | Status: AC

## 2020-09-21 MED ORDER — ALPRAZOLAM 0.25 MG PO TABS: 0.2500 mg | ORAL_TABLET | Freq: Every day | ORAL | 0 refills | Status: DC | PRN

## 2020-09-21 NOTE — Progress Notes (Signed)
Established Patient Office Visit  Subjective:  Patient ID: Robin Murillo, female    DOB: 08-28-1950  Age: 70 y.o. MRN: [id number]  CC:  Chief Complaint  Patient presents with  . Diabetes    HPI Robin Murillo presents for   Hypertension- Pt denies chest pain, SOB, dizziness, or heart palpitations.  Taking meds as directed w/o problems.  Denies medication side effects.    Diabetes - no hypoglycemic events. No wounds or sores that are not healing well. No increased thirst or urination. Checking glucose at home. Taking medications as prescribed without any side effects.  Depression/anxiety-she is currently on 60 mg of fluoxetine she does have an old prescription of Xanax from a couple years ago on file she is asking for refill on that today she just feels more stressed and anxious.  PHQ-9 score of 10 today and GAD-7 score of 12  Hypothyroidism - Taking medication regularly in the AM away from food and vitamins, etc. No recent change to skin, hair, or energy levels.   Past Medical History:  Diagnosis Date  . Heart disease    pacemaker/defiberlator  . Thyroid disease     Past Surgical History:  Procedure Laterality Date  . CARDIAC DEFIBRILLATOR PLACEMENT     06/2010 at [facility]  . CARDIAC ELECTROPHYSIOLOGY STUDY AND ABLATION     06/2010  . PACEMAKER PLACEMENT     06/2010 at [facility]    Family History  Problem Relation Age of Onset  . Heart attack Brother   . Heart disease Sister        Murmur  . Thyroid disease Sister   . Diabetes Brother     Social History   Socioeconomic History  . Marital status: Married    Spouse name: Maurine Minister  . Number of children: 3  . Years of education: 27  . Highest education level: 12th grade  Occupational History  . Occupation: disability    Comment: office work  Tobacco Use  . Smoking status: Former Smoker    Quit date: 04/04/2003    Years since quitting: 17.4  . Smokeless tobacco: Never Used  Vaping Use  . Vaping Use:  Never used  Substance and Sexual Activity  . Alcohol use: No  . Drug use: No  . Sexual activity: Not Currently  Other Topics Concern  . Not on file  Social History Narrative   1 caffeine dirink per day. No regular exercise.    Social Determinants of Health   Financial Resource Strain: Low Risk   . Difficulty of Paying Living Expenses: Not hard at all  Food Insecurity: No Food Insecurity  . Worried About Programme researcher, broadcasting/film/video in the Last Year: Never true  . Ran Out of Food in the Last Year: Never true  Transportation Needs: No Transportation Needs  . Lack of Transportation (Medical): No  . Lack of Transportation (Non-Medical): No  Physical Activity: Inactive  . Days of Exercise per Week: 0 days  . Minutes of Exercise per Session: 0 min  Stress: No Stress Concern Present  . Feeling of Stress : Not at all  Social Connections: Moderately Isolated  . Frequency of Communication with Friends and Family: Twice a week  . Frequency of Social Gatherings with Friends and Family: More than three times a week  . Attends Religious Services: Never  . Active Member of Clubs or Organizations: No  . Attends Banker Meetings: Never  . Marital Status: Married  Catering manager Violence: Not  At Risk  . Fear of Current or Ex-Partner: No  . Emotionally Abused: No  . Physically Abused: No  . Sexually Abused: No    Outpatient Medications Prior to Visit  Medication Sig Dispense Refill  . albuterol (VENTOLIN HFA) 108 (90 Base) MCG/ACT inhaler Inhale 2 puffs into the lungs every 6 (six) hours as needed for wheezing or shortness of breath. 18 g prn  . Ascorbic Acid (VITAMIN C) 100 MG tablet Take 100 mg by mouth daily.    Marland Kitchen aspirin 81 MG tablet Take 81 mg by mouth daily.    Marland Kitchen atorvastatin (LIPITOR) 20 MG tablet TAKE 1 TABLET (20 MG) BY MOUTH NIGHTLY 90 tablet 3  . Cholecalciferol (VITAMIN D PO) Take 2,000 Int'l Units by mouth daily.     Marland Kitchen FLUoxetine (PROZAC) 20 MG capsule TAKE 3 CAPSULES  EVERY DAY 270 capsule 1  . fluticasone furoate-vilanterol (BREO ELLIPTA) 100-25 MCG/INH AEPB Inhale 1 puff into the lungs daily. 60 each 5  . levothyroxine (SYNTHROID) 125 MCG tablet TAKE 1 TABLET EVERY DAY 90 tablet 1  . lisinopril (ZESTRIL) 5 MG tablet TAKE 1 TABLET (5 MG TOTAL) BY MOUTH DAILY. 90 tablet 3  . magnesium oxide (MAG-OX) 400 MG tablet Take 400 mg by mouth 2 (two) times daily.    . metFORMIN (GLUCOPHAGE-XR) 500 MG 24 hr tablet TAKE 1 TABLET BY MOUTH EVERY DAY WITH BREAKFAST 90 tablet 0  . metoprolol (LOPRESSOR) 50 MG tablet Take 50 mg by mouth 2 (two) times daily.    . Multiple Vitamins-Minerals (ZINC PO) Take by mouth.    . pantoprazole (PROTONIX) 40 MG tablet TAKE 1 TABLET (40 MG TOTAL) BY MOUTH DAILY. 90 tablet 3  . ALPRAZolam (XANAX) 0.5 MG tablet Take 1 tablet (0.5 mg total) by mouth daily as needed for anxiety. (Patient not taking: No sig reported) 30 tablet 0  . guaiFENesin (MUCINEX) 600 MG 12 hr tablet Take 2 tablets (1,200 mg total) by mouth 2 (two) times daily. (Patient not taking: Reported on 09/08/2020) 28 tablet 1  . oxybutynin (DITROPAN) 5 MG tablet Take 1 tablet (5 mg total) by mouth 2 (two) times daily. 60 tablet 3   No facility-administered medications prior to visit.    Allergies  Allergen Reactions  . Morphine And Related Swelling    Swelling in face    ROS Review of Systems    Objective:    Physical Exam Constitutional:      Appearance: She is well-developed.  HENT:     Head: Normocephalic and atraumatic.  Cardiovascular:     Rate and Rhythm: Normal rate and regular rhythm.     Heart sounds: Normal heart sounds.  Pulmonary:     Effort: Pulmonary effort is normal.     Breath sounds: Normal breath sounds.  Skin:    General: Skin is warm and dry.  Neurological:     Mental Status: She is alert and oriented to person, place, and time.  Psychiatric:        Behavior: Behavior normal.     BP (!) 138/56   Pulse 71   Ht 5\' 3"  (1.6 m)   Wt 241  lb (109.3 kg)   SpO2 94%   BMI 42.69 kg/m  Wt Readings from Last 3 Encounters:  09/21/20 241 lb (109.3 kg)  06/02/20 240 lb (108.9 kg)  09/16/19 240 lb (108.9 kg)     Health Maintenance Due  Topic Date Due  . TETANUS/TDAP  05/10/2018  . DEXA SCAN  05/13/2020    There are no preventive care reminders to display for this patient.  Lab Results  Component Value Date   TSH 0.57 09/21/2020   Lab Results  Component Value Date   WBC 7.6 09/21/2020   HGB 13.7 09/21/2020   HCT 43.1 09/21/2020   MCV 93.5 09/21/2020   PLT 305 09/21/2020   Lab Results  Component Value Date   NA 138 05/19/2019   K 5.0 05/19/2019   CO2 25 05/19/2019   GLUCOSE 140 (H) 05/19/2019   BUN 16 05/19/2019   CREATININE 1.10 (H) 05/19/2019   BILITOT 0.5 05/19/2019   ALKPHOS 155 (H) 07/18/2016   AST 22 05/19/2019   ALT 23 05/19/2019   PROT 7.7 05/19/2019   ALBUMIN 3.9 07/18/2016   CALCIUM 9.4 05/19/2019   Lab Results  Component Value Date   CHOL 127 05/19/2019   Lab Results  Component Value Date   HDL 51 05/19/2019   Lab Results  Component Value Date   LDLCALC 60 05/19/2019   Lab Results  Component Value Date   TRIG 80 05/19/2019   Lab Results  Component Value Date   CHOLHDL 2.5 05/19/2019   Lab Results  Component Value Date   HGBA1C 4.8 09/21/2020      Assessment & Plan:   Problem List Items Addressed This Visit      Endocrine   Hypothyroid    TSH was well over a year ago she feels like everything is well regulated she did go for blood work this morning so we will make adjustments if needed.      Diabetes type 2, controlled (HCC) - Primary    A1c looks phenomenal today in fact we discussed that if the A1c stays down when I see her back in 4 months and the plan will be to discontinue her Metformin completely she is really been doing a great job      Relevant Orders   POCT glycosylated hemoglobin (Hb A1C) (Completed)   POCT UA - Microalbumin (Completed)     Other    Depression with anxiety    Discussed mood.  She does have quite significant depression and anxiety symptoms she really cannot put her finger on why she does feel like she has some stressful things going on.  But she reports that she is sleeping well.  She is staying active she does feel like she has good support from her son and her sister.  She is not interested in therapy or counseling.  She would like to continue with the fluoxetine but is asking for small quantity of Xanax.  I did give her 10 pills to use very sparingly and actually decrease the dose to 0.25 mg.  Just encouraged her not to use them often and if she is using them often then we need to address what exactly is causing her symptoms.      Relevant Medications   ALPRAZolam (XANAX) 0.25 MG tablet    Other Visit Diagnoses    Need for tetanus, diphtheria, and acellular pertussis (Tdap) vaccine in patient of adolescent age or older       Need for Zostavax administration       Need for prophylactic vaccination against Streptococcus pneumoniae (pneumococcus)       Relevant Orders   Pneumococcal polysaccharide vaccine 23-valent greater than or equal to 2yo subcutaneous/IM (Completed)     Prescription for Tdap given today is that she can get it done at the pharmacy.  Meds ordered  this encounter  Medications  . ALPRAZolam (XANAX) 0.25 MG tablet    Sig: Take 1 tablet (0.25 mg total) by mouth daily as needed for anxiety.    Dispense:  10 tablet    Refill:  0  . Zoster Vaccine Adjuvanted [facility](SHINGRIX) injection    Sig: Inject 0.5 mLs into the muscle once for 1 dose.    Dispense:  0.5 mL    Refill:  0    2nd dose to be done 2-6 months after 1st  . Tdap (BOOSTRIX) 5-2.5-18.5 LF-MCG/0.5 injection    Sig: Inject 0.5 mLs into the muscle once for 1 dose.    Dispense:  0.5 mL    Refill:  0    Follow-up: Return in about 4 months (around 01/21/2021) for Diabetes follow-up.    Nani Gasseratherine Lavra Imler, MD

## 2020-09-21 NOTE — Assessment & Plan Note (Signed)
TSH was well over a year ago she feels like everything is well regulated she did go for blood work this morning so we will make adjustments if needed.

## 2020-09-21 NOTE — Assessment & Plan Note (Signed)
Discussed mood.  She does have quite significant depression and anxiety symptoms she really cannot put her finger on why she does feel like she has some stressful things going on.  But she reports that she is sleeping well.  She is staying active she does feel like she has good support from her son and her sister.  She is not interested in therapy or counseling.  She would like to continue with the fluoxetine but is asking for small quantity of Xanax.  I did give her 10 pills to use very sparingly and actually decrease the dose to 0.25 mg.  Just encouraged her not to use them often and if she is using them often then we need to address what exactly is causing her symptoms.

## 2020-09-21 NOTE — Assessment & Plan Note (Signed)
A1c looks phenomenal today in fact we discussed that if the A1c stays down when I see her back in 4 months and the plan will be to discontinue her Metformin completely she is really been doing a great job

## 2020-09-22 ENCOUNTER — Other Ambulatory Visit: Payer: Self-pay | Admitting: *Deleted

## 2020-09-22 DIAGNOSIS — R748 Abnormal levels of other serum enzymes: Secondary | ICD-10-CM

## 2020-09-22 LAB — COMPLETE METABOLIC PANEL WITH GFR
AG Ratio: 1.3 (calc) (ref 1.0–2.5)
ALT: 32 U/L — ABNORMAL HIGH (ref 6–29)
AST: 27 U/L (ref 10–35)
Albumin: 3.8 g/dL (ref 3.6–5.1)
Alkaline phosphatase (APISO): 320 U/L — ABNORMAL HIGH (ref 37–153)
BUN: 18 mg/dL (ref 7–25)
CO2: 26 mmol/L (ref 20–32)
Calcium: 9.5 mg/dL (ref 8.6–10.4)
Chloride: 108 mmol/L (ref 98–110)
Creat: 0.84 mg/dL (ref 0.50–0.99)
GFR, Est African American: 82 mL/min/{1.73_m2} (ref >=60)
GFR, Est Non African American: 71 mL/min/{1.73_m2} (ref >=60)
Globulin: 3 g/dL (calc) (ref 1.9–3.7)
Glucose, Bld: 99 mg/dL (ref 65–99)
Potassium: 4.6 mmol/L (ref 3.5–5.3)
Sodium: 141 mmol/L (ref 135–146)
Total Bilirubin: 0.4 mg/dL (ref 0.2–1.2)
Total Protein: 6.8 g/dL (ref 6.1–8.1)

## 2020-09-22 LAB — CBC WITH DIFFERENTIAL/PLATELET
Absolute Monocytes: 851 cells/uL (ref 200–950)
Basophils Absolute: 68 cells/uL (ref 0–200)
Basophils Relative: 0.9 %
Eosinophils Absolute: 342 cells/uL (ref 15–500)
Eosinophils Relative: 4.5 %
HCT: 43.1 % (ref 35.0–45.0)
Hemoglobin: 13.7 g/dL (ref 11.7–15.5)
Lymphs Abs: 1930 cells/uL (ref 850–3900)
MCH: 29.7 pg (ref 27.0–33.0)
MCHC: 31.8 g/dL — ABNORMAL LOW (ref 32.0–36.0)
MCV: 93.5 fL (ref 80.0–100.0)
MPV: 11 fL (ref 7.5–12.5)
Monocytes Relative: 11.2 %
Neutro Abs: 4408 cells/uL (ref 1500–7800)
Neutrophils Relative %: 58 %
Platelets: 305 10*3/uL (ref 140–400)
RBC: 4.61 10*6/uL (ref 3.80–5.10)
RDW: 13 % (ref 11.0–15.0)
Total Lymphocyte: 25.4 %
WBC: 7.6 10*3/uL (ref 3.8–10.8)

## 2020-09-22 LAB — TSH: TSH: 0.57 mIU/L (ref 0.40–4.50)

## 2020-09-22 LAB — LIPID PANEL W/REFLEX DIRECT LDL
Cholesterol: 126 mg/dL (ref <200)
HDL: 57 mg/dL (ref >=50)
LDL Cholesterol (Calc): 56 mg/dL (calc)
Non-HDL Cholesterol (Calc): 69 mg/dL (calc) (ref <130)
Total CHOL/HDL Ratio: 2.2 (calc) (ref <5.0)
Triglycerides: 54 mg/dL (ref <150)

## 2020-09-22 LAB — VITAMIN D 25 HYDROXY (VIT D DEFICIENCY, FRACTURES): Vit D, 25-Hydroxy: 34 ng/mL (ref 30–100)

## 2020-09-22 NOTE — Addendum Note (Signed)
Addended by: Deno Etienne on: 09/22/2020 10:51 AM   Modules accepted: Orders

## 2020-09-25 DIAGNOSIS — H524 Presbyopia: Secondary | ICD-10-CM | POA: Diagnosis not present

## 2020-09-25 DIAGNOSIS — H5213 Myopia, bilateral: Secondary | ICD-10-CM | POA: Diagnosis not present

## 2020-09-25 DIAGNOSIS — E119 Type 2 diabetes mellitus without complications: Secondary | ICD-10-CM | POA: Diagnosis not present

## 2020-09-25 LAB — HM DIABETES EYE EXAM

## 2020-09-27 DIAGNOSIS — R748 Abnormal levels of other serum enzymes: Secondary | ICD-10-CM | POA: Diagnosis not present

## 2020-09-27 DIAGNOSIS — Z950 Presence of cardiac pacemaker: Secondary | ICD-10-CM | POA: Diagnosis not present

## 2020-09-27 DIAGNOSIS — I4581 Long QT syndrome: Secondary | ICD-10-CM | POA: Diagnosis not present

## 2020-09-28 LAB — COLOGUARD
COLOGUARD: POSITIVE — AB
Cologuard: POSITIVE — AB

## 2020-09-28 LAB — CP4508-PT/INR AND PTT
INR: 1
Prothrombin Time: 9.8 s (ref 9.0–11.5)
aPTT: 27 s (ref 23–32)

## 2020-09-28 LAB — PTH, INTACT AND CALCIUM
Calcium: 9.5 mg/dL (ref 8.6–10.4)
PTH: 56 pg/mL (ref 16–77)

## 2020-09-28 LAB — GAMMA GT: GGT: 36 U/L (ref 3–65)

## 2020-10-02 ENCOUNTER — Telehealth: Payer: Self-pay

## 2020-10-02 NOTE — Telephone Encounter (Signed)
Robin Murillo wanted to know if Dr Linford Arnold would write a letter to excuse her from jury duty. She states she has COPD and is unable to walk far.    Jury number - [phone] 10/26/2020

## 2020-10-02 NOTE — Telephone Encounter (Signed)
OK for JD

## 2020-10-04 ENCOUNTER — Encounter: Payer: Self-pay | Admitting: Family Medicine

## 2020-10-04 ENCOUNTER — Telehealth: Payer: Self-pay | Admitting: Family Medicine

## 2020-10-04 DIAGNOSIS — R195 Other fecal abnormalities: Secondary | ICD-10-CM

## 2020-10-04 NOTE — Telephone Encounter (Signed)
Letter written. Patient is aware letter is ready for pick up.

## 2020-10-04 NOTE — Telephone Encounter (Signed)
Call pt: cologuard was positive, so that means we need to refer her to GI for further workup.  See which GI office she would prefer o/w rec refer to [facility]

## 2020-10-06 NOTE — Telephone Encounter (Signed)
Called pt, went to vm. Left vm with phone number for return call. Will call pt again later today.

## 2020-10-06 NOTE — Telephone Encounter (Signed)
Patient advised.

## 2020-10-19 ENCOUNTER — Encounter: Payer: Self-pay | Admitting: Family Medicine

## 2020-10-19 ENCOUNTER — Telehealth (INDEPENDENT_AMBULATORY_CARE_PROVIDER_SITE_OTHER): Payer: Medicare HMO | Admitting: Family Medicine

## 2020-10-19 DIAGNOSIS — J069 Acute upper respiratory infection, unspecified: Secondary | ICD-10-CM

## 2020-10-19 MED ORDER — BENZONATATE 100 MG PO CAPS
100.0000 mg | ORAL_CAPSULE | Freq: Two times a day (BID) | ORAL | 0 refills | Status: DC | PRN
Start: 2020-10-19 — End: 2021-01-29

## 2020-10-19 MED ORDER — GUAIFENESIN ER 600 MG PO TB12: 1200.0000 mg | ORAL_TABLET | Freq: Two times a day (BID) | ORAL | 2 refills | Status: DC

## 2020-10-19 MED ORDER — ALBUTEROL SULFATE HFA 108 (90 BASE) MCG/ACT IN AERS: 2.0000 | INHALATION_SPRAY | Freq: Four times a day (QID) | RESPIRATORY_TRACT | 99 refills | Status: DC | PRN

## 2020-10-19 NOTE — Patient Instructions (Signed)
Over the counter medications that may be helpful for symptoms:  . Guaifenesin 1200 mg extended release tabs twice daily, with plenty of water o For cough and congestion o Brand name: Mucinex   . Pseudoephedrine 30 mg, one or two tabs every 4 to 6 hours o For sinus congestion o Brand name: Sudafed o You must get this from the pharmacy counter.  . Oxymetazoline nasal spray each morning, one spray in each nostril, for NO MORE THAN 3 days  o For nasal and sinus congestion o Brand name: Afrin . Saline nasal spray or Saline Nasal Irrigation 3-5 times a day o For nasal and sinus congestion o Brand names: Ocean or AYR . Fluticasone nasal spray, one spray in each nostril, each morning (after oxymetazoline and saline, if used) o For nasal and sinus congestion o Brand name: Flonase . Warm salt water gargles  o For sore throat o Every few hours as needed . Alternate ibuprofen 400-600 mg and acetaminophen 1000 mg every 4-6 hours o For fever, body aches, headache o Brand names: Motrin or Advil and Tylenol . Dextromethorphan 12-hour cough version 30 mg every 12 hours  o For cough o Brand name: Delsym Stop all other cold medications for now (Nyquil, Dayquil, Tylenol Cold, Theraflu, etc) and other non-prescription cough/cold preparations. Many of these have the same ingredients listed above and could cause an overdose of medication.   Herbal treatments that have been shown to be helpful in some patients include: Vitamin C 1000mg per day Vitamin D 4000iU per day Zinc 100mg per day Quercetin 25-500mg twice a day Melatonin 5-10mg at bedtime  General Instructions . Allow your body to rest . Drink PLENTY of fluids  If you develop severe shortness of breath, uncontrolled fevers, coughing up blood, confusion, chest pain, or signs of dehydration (such as significantly decreased urine amounts or dizziness with standing) please go to the ER.  

## 2020-10-19 NOTE — Progress Notes (Signed)
Virtual Video Visit via MyChart Note  I connected with  Robin Murillo on 10/19/20 at 11:10 AM EDT by the video enabled telemedicine application for MyChart, and verified that I am speaking with the correct person using two identifiers.   I introduced myself as a Publishing rights manager with the practice. We discussed the limitations of evaluation and management by telemedicine and the availability of in person appointments. The patient expressed understanding and agreed to proceed.  Participating parties in this visit include: The patient and the nurse practitioner listed.  The patient is: At home I am: In the office - [name]  Subjective:    CC:  Chief Complaint  Patient presents with  . Sinusitis    HPI: Robin Murillo is a 70 y.o. year old female presenting today via MyChart today for sinusitis/URI.  Patient with URI symptoms since Saturday. She states her granddaughter came home with a cold last week (rhinorrhea and cough), but is already starting to feel better. Patient states she initially felt horrible - extreme fatigue, body aches, "felt like I had been run over by a truck," severe sinus pressure and 8/10 frontal headache, chills, flushing, nausea, congestion, rhinorrhea, coughing. She states yesterday she started to notice some improvement by the end of the day and today she reports feeling like a different person. She continues to have a congested cough and mild sinus pressure, but reports she is feeling quite a bit better overall. She has been having some trouble sleeping due to the coughing, but denies any chest pain, shortness of breath, wheezing, fevers, vomiting, diarrhea. The only thing she has been taking so far is Hall's cough drops.     Past medical history, Surgical history, Family history not pertinant except as noted below, Social history, Allergies, and medications have been entered into the medical record, reviewed, and corrections made.   Review of  Systems:  All review of systems negative except what is listed in the HPI   Objective:    General:  Speaking clearly in complete sentences. Absent shortness of breath noted.   Alert and oriented x3.   Normal judgment.  Absent acute distress.   Impression and Recommendations:    1. Viral upper respiratory tract infection  Patient with about 5 days of symptoms that have already begun to improve significantly. Given that she recently had antibiotics for a sinus infection in February, I would prefer to hold for now since she is already starting to feel better. Would like to start conservatively with Mucinex, Flonase, rest, hydration, humidifier, warm compresses, honey, tessalon, and refilling her albuterol (she has not been needing it much lately). If she is not feeling any better by Monday or if symptoms worsen again we can treat as bacterial sinusitis. Educated on signs and symptoms requiring further evaluation.   - benzonatate (TESSALON) 100 MG capsule; Take 1 capsule (100 mg total) by mouth 2 (two) times daily as needed for cough.  Dispense: 20 capsule; Refill: 0 - albuterol (VENTOLIN HFA) 108 (90 Base) MCG/ACT inhaler; Inhale 2 puffs into the lungs every 6 (six) hours as needed for wheezing or shortness of breath.  Dispense: 18 g; Refill: prn - guaiFENesin (MUCINEX) 600 MG 12 hr tablet; Take 2 tablets (1,200 mg total) by mouth 2 (two) times daily.  Dispense: 30 tablet; Refill: 2   Follow-up if symptoms worsen or fail to improve.    I discussed the assessment and treatment plan with the patient. The patient was provided an opportunity to ask  questions and all were answered. The patient agreed with the plan and demonstrated an understanding of the instructions.   The patient was advised to call back or seek an in-person evaluation if the symptoms worsen or if the condition fails to improve as anticipated.  I provided 20 minutes of non-face-to-face interaction with this MYCHART visit  including intake, same-day documentation, and chart review.   Lollie Marrow Reola Calkins, DNP, FNP-C

## 2020-11-03 ENCOUNTER — Other Ambulatory Visit: Payer: Self-pay | Admitting: Family Medicine

## 2020-11-06 DIAGNOSIS — I472 Ventricular tachycardia: Secondary | ICD-10-CM | POA: Diagnosis not present

## 2020-11-16 DIAGNOSIS — G4733 Obstructive sleep apnea (adult) (pediatric): Secondary | ICD-10-CM | POA: Diagnosis not present

## 2020-11-16 DIAGNOSIS — Z95810 Presence of automatic (implantable) cardiac defibrillator: Secondary | ICD-10-CM | POA: Diagnosis not present

## 2020-11-16 DIAGNOSIS — I454 Nonspecific intraventricular block: Secondary | ICD-10-CM | POA: Diagnosis not present

## 2020-11-16 DIAGNOSIS — I4892 Unspecified atrial flutter: Secondary | ICD-10-CM | POA: Diagnosis not present

## 2021-01-16 ENCOUNTER — Other Ambulatory Visit: Payer: Self-pay | Admitting: Family Medicine

## 2021-01-16 DIAGNOSIS — E1129 Type 2 diabetes mellitus with other diabetic kidney complication: Secondary | ICD-10-CM

## 2021-01-22 ENCOUNTER — Ambulatory Visit: Payer: Medicare HMO | Admitting: Family Medicine

## 2021-01-29 ENCOUNTER — Ambulatory Visit (INDEPENDENT_AMBULATORY_CARE_PROVIDER_SITE_OTHER): Payer: Medicare HMO | Admitting: Family Medicine

## 2021-01-29 ENCOUNTER — Other Ambulatory Visit: Payer: Self-pay

## 2021-01-29 ENCOUNTER — Encounter: Payer: Self-pay | Admitting: Family Medicine

## 2021-01-29 VITALS — BP 121/49 | HR 82 | Ht 63.0 in | Wt 238.0 lb

## 2021-01-29 DIAGNOSIS — E119 Type 2 diabetes mellitus without complications: Secondary | ICD-10-CM

## 2021-01-29 DIAGNOSIS — Z1231 Encounter for screening mammogram for malignant neoplasm of breast: Secondary | ICD-10-CM

## 2021-01-29 DIAGNOSIS — R195 Other fecal abnormalities: Secondary | ICD-10-CM

## 2021-01-29 LAB — POCT GLYCOSYLATED HEMOGLOBIN (HGB A1C): Hemoglobin A1C: 5.7 % — AB (ref 4.0–5.6)

## 2021-01-29 MED ORDER — METFORMIN HCL ER 500 MG PO TB24: ORAL_TABLET | ORAL | 1 refills | Status: DC

## 2021-01-29 NOTE — Assessment & Plan Note (Signed)
Well controlled. Continue current regimen. Follow up in  3-4 mo  

## 2021-01-29 NOTE — Progress Notes (Signed)
Established Patient Office Visit  Subjective:  Patient ID: Robin Murillo, female    DOB: March 03, 1951  Age: 70 y.o. MRN: [id number]  CC:  Chief Complaint  Patient presents with   Diabetes   Hypertension    HPI Shaquera Ansley presents for   Diabetes - no hypoglycemic events. No wounds or sores that are not healing well. No increased thirst or urination. Checking glucose at home. Taking medications as prescribed without any side effects.  Hypertension- Pt denies chest pain, SOB, dizziness, or heart palpitations.  Taking meds as directed w/o problems.  Denies medication side effects.    She says she never heard from GI referral for pos Cologuard from [place].     BMI 42 - she is also having a hard time losing weight.  She is stable but would ike to lose weight.   Past Medical History:  Diagnosis Date   Heart disease    pacemaker/defiberlator   Thyroid disease     Past Surgical History:  Procedure Laterality Date   CARDIAC DEFIBRILLATOR PLACEMENT     06/2010 at [facility]   CARDIAC ELECTROPHYSIOLOGY STUDY AND ABLATION     06/2010   PACEMAKER PLACEMENT     06/2010 at [facility]    Family History  Problem Relation Age of Onset   Heart attack Brother    Heart disease Sister        Murmur   Thyroid disease Sister    Diabetes Brother     Social History   Socioeconomic History   Marital status: Married    Spouse name: Maurine Minister   Number of children: 3   Years of education: 12   Highest education level: 12th grade  Occupational History   Occupation: disability    Comment: office work  Tobacco Use   Smoking status: Former    Types: Cigarettes    Quit date: 04/04/2003    Years since quitting: 17.8   Smokeless tobacco: Never  Vaping Use   Vaping Use: Never used  Substance and Sexual Activity   Alcohol use: No   Drug use: No   Sexual activity: Not Currently  Other Topics Concern   Not on file  Social History Narrative   1 caffeine dirink per day. No regular  exercise.    Social Determinants of Health   Financial Resource Strain: Low Risk    Difficulty of Paying Living Expenses: Not hard at all  Food Insecurity: No Food Insecurity   Worried About Programme researcher, broadcasting/film/video in the Last Year: Never true   Ran Out of Food in the Last Year: Never true  Transportation Needs: No Transportation Needs   Lack of Transportation (Medical): No   Lack of Transportation (Non-Medical): No  Physical Activity: Inactive   Days of Exercise per Week: 0 days   Minutes of Exercise per Session: 0 min  Stress: No Stress Concern Present   Feeling of Stress : Not at all  Social Connections: Moderately Isolated   Frequency of Communication with Friends and Family: Twice a week   Frequency of Social Gatherings with Friends and Family: More than three times a week   Attends Religious Services: Never   Database administrator or Organizations: No   Attends Banker Meetings: Never   Marital Status: Married  Catering manager Violence: Not At Risk   Fear of Current or Ex-Partner: No   Emotionally Abused: No   Physically Abused: No   Sexually Abused: No  Outpatient Medications Prior to Visit  Medication Sig Dispense Refill   albuterol (VENTOLIN HFA) 108 (90 Base) MCG/ACT inhaler Inhale 2 puffs into the lungs every 6 (six) hours as needed for wheezing or shortness of breath. 18 g prn   ALPRAZolam (XANAX) 0.25 MG tablet Take 1 tablet (0.25 mg total) by mouth daily as needed for anxiety. 10 tablet 0   Ascorbic Acid (VITAMIN C) 100 MG tablet Take 100 mg by mouth daily.     aspirin 81 MG tablet Take 81 mg by mouth daily.     atorvastatin (LIPITOR) 20 MG tablet TAKE 1 TABLET (20 MG) BY MOUTH NIGHTLY 90 tablet 3   Cholecalciferol (VITAMIN D PO) Take 2,000 Int'l Units by mouth daily.      FLUoxetine (PROZAC) 20 MG capsule TAKE 3 CAPSULES EVERY DAY 270 capsule 1   fluticasone furoate-vilanterol (BREO ELLIPTA) 100-25 MCG/INH AEPB Inhale 1 puff into the lungs daily. 60  each 5   levothyroxine (SYNTHROID) 125 MCG tablet TAKE 1 TABLET EVERY DAY 90 tablet 1   lisinopril (ZESTRIL) 5 MG tablet TAKE 1 TABLET (5 MG TOTAL) BY MOUTH DAILY. 90 tablet 3   magnesium oxide (MAG-OX) 400 MG tablet Take 400 mg by mouth 2 (two) times daily.     metFORMIN (GLUCOPHAGE-XR) 500 MG 24 hr tablet TAKE 1 TABLET BY MOUTH EVERY DAY WITH BREAKFAST 90 tablet 0   metoprolol (LOPRESSOR) 50 MG tablet Take 50 mg by mouth 2 (two) times daily.     Multiple Vitamins-Minerals (ZINC PO) Take 1 tablet by mouth daily.     pantoprazole (PROTONIX) 40 MG tablet TAKE 1 TABLET (40 MG TOTAL) BY MOUTH DAILY. 90 tablet 3   Multiple Vitamins-Minerals (ZINC PO) Take by mouth.     benzonatate (TESSALON) 100 MG capsule Take 1 capsule (100 mg total) by mouth 2 (two) times daily as needed for cough. 20 capsule 0   guaiFENesin (MUCINEX) 600 MG 12 hr tablet Take 2 tablets (1,200 mg total) by mouth 2 (two) times daily. 30 tablet 2   No facility-administered medications prior to visit.    Allergies  Allergen Reactions   Morphine And Related Swelling    Swelling in face    ROS Review of Systems    Objective:    Physical Exam Constitutional:      Appearance: Normal appearance. She is well-developed.  HENT:     Head: Normocephalic and atraumatic.  Cardiovascular:     Rate and Rhythm: Normal rate and regular rhythm.     Heart sounds: Normal heart sounds.  Pulmonary:     Effort: Pulmonary effort is normal.     Breath sounds: Normal breath sounds.  Skin:    General: Skin is warm and dry.  Neurological:     Mental Status: She is alert and oriented to person, place, and time.  Psychiatric:        Behavior: Behavior normal.    BP (!) 121/49   Pulse 82   Ht 5\' 3"  (1.6 m)   Wt 238 lb (108 kg)   SpO2 97%   BMI 42.16 kg/m  Wt Readings from Last 3 Encounters:  01/29/21 238 lb (108 kg)  09/21/20 241 lb (109.3 kg)  06/02/20 240 lb (108.9 kg)     Health Maintenance Due  Topic Date Due    TETANUS/TDAP  05/10/2018   DEXA SCAN  05/13/2020   OPHTHALMOLOGY EXAM  08/02/2020    There are no preventive care reminders to display for this patient.  Lab Results  Component Value Date   TSH 0.57 09/21/2020   Lab Results  Component Value Date   WBC 7.6 09/21/2020   HGB 13.7 09/21/2020   HCT 43.1 09/21/2020   MCV 93.5 09/21/2020   PLT 305 09/21/2020   Lab Results  Component Value Date   NA 141 09/21/2020   K 4.6 09/21/2020   CO2 26 09/21/2020   GLUCOSE 99 09/21/2020   BUN 18 09/21/2020   CREATININE 0.84 09/21/2020   BILITOT 0.4 09/21/2020   ALKPHOS 155 (H) 07/18/2016   AST 27 09/21/2020   ALT 32 (H) 09/21/2020   PROT 6.8 09/21/2020   ALBUMIN 3.9 07/18/2016   CALCIUM 9.5 09/27/2020   Lab Results  Component Value Date   CHOL 126 09/21/2020   Lab Results  Component Value Date   HDL 57 09/21/2020   Lab Results  Component Value Date   LDLCALC 56 09/21/2020   Lab Results  Component Value Date   TRIG 54 09/21/2020   Lab Results  Component Value Date   CHOLHDL 2.2 09/21/2020   Lab Results  Component Value Date   HGBA1C 5.7 (A) 01/29/2021      Assessment & Plan:   Problem List Items Addressed This Visit       Endocrine   Diabetes type 2, controlled (HCC) - Primary    Well controlled. Continue current regimen. Follow up in  3-4 mo        Relevant Orders   POCT glycosylated hemoglobin (Hb A1C) (Completed)   Other Visit Diagnoses     Positive colorectal cancer screening using Cologuard test       Relevant Orders   Ambulatory referral to Gastroenterology   Screening mammogram, encounter for       Relevant Orders   MM 3D SCREEN BREAST BILATERAL       BMI 41- discussed increasing lean protein in her diet. She is doing well controlling her diabetes.    Will go ahead and place new GI referral.  She missed her mmmo last ur as she was sick and would like to reschedule.    No orders of the defined types were placed in this  encounter.   Follow-up: Return in about 4 months (around 06/01/2021) for Diabetes follow-up.    Nani Gasser, MD

## 2021-01-30 ENCOUNTER — Other Ambulatory Visit: Payer: Self-pay | Admitting: Family Medicine

## 2021-01-30 DIAGNOSIS — E119 Type 2 diabetes mellitus without complications: Secondary | ICD-10-CM

## 2021-02-02 ENCOUNTER — Other Ambulatory Visit: Payer: Self-pay | Admitting: *Deleted

## 2021-02-02 DIAGNOSIS — E119 Type 2 diabetes mellitus without complications: Secondary | ICD-10-CM

## 2021-02-02 MED ORDER — METFORMIN HCL ER 500 MG PO TB24: ORAL_TABLET | ORAL | 0 refills | Status: DC

## 2021-02-08 DIAGNOSIS — R195 Other fecal abnormalities: Secondary | ICD-10-CM | POA: Diagnosis not present

## 2021-02-08 DIAGNOSIS — K219 Gastro-esophageal reflux disease without esophagitis: Secondary | ICD-10-CM | POA: Diagnosis not present

## 2021-02-13 DIAGNOSIS — I472 Ventricular tachycardia: Secondary | ICD-10-CM | POA: Diagnosis not present

## 2021-02-14 ENCOUNTER — Other Ambulatory Visit: Payer: Self-pay | Admitting: Family Medicine

## 2021-02-21 ENCOUNTER — Other Ambulatory Visit: Payer: Self-pay | Admitting: Radiology

## 2021-02-26 ENCOUNTER — Telehealth: Payer: Self-pay | Admitting: Lab

## 2021-02-26 NOTE — Chronic Care Management (AMB) (Signed)
  Chronic Care Management   Outreach Note  02/26/2021 Name: Robin Murillo MRN: [id number] DOB: 12-Mar-1951  Referred by: Agapito Games, MD Reason for referral : Medication Management   An unsuccessful telephone outreach was attempted today. The patient was referred to the pharmacist for assistance with care management and care coordination.   Follow Up Plan:   Robin Murillo  Upstream Scheduler

## 2021-03-13 ENCOUNTER — Telehealth: Payer: Self-pay | Admitting: Lab

## 2021-03-13 NOTE — Chronic Care Management (AMB) (Signed)
  Chronic Care Management   Note  03/13/2021 Name: Pavielle Biggar MRN: [id number] DOB: [birth year]  Taylen Wendland is a 70 y.o. year old female who is a primary care patient of Metheney, Barbarann Ehlers, MD. I reached out to [name] by phone today in response to a referral sent by Ms. Jeanett Schlein PCP, Agapito Games, MD.   Ms. Perham was given information about Chronic Care Management services today including:  CCM service includes personalized support from designated clinical staff supervised by her physician, including individualized plan of care and coordination with other care providers 24/7 contact phone numbers for assistance for urgent and routine care needs. Service will only be billed when office clinical staff spend 20 minutes or more in a month to coordinate care. Only one practitioner may furnish and bill the service in a calendar month. The patient may stop CCM services at any time (effective at the end of the month) by phone call to the office staff.   Patient agreed to services and verbal consent obtained.   Follow up plan:   Carilyn Goodpasture  Upstream Scheduler

## 2021-04-17 ENCOUNTER — Telehealth: Payer: Self-pay

## 2021-04-17 NOTE — Progress Notes (Signed)
Chronic Care Management Pharmacy Assistant   Name: Alan Riles  MRN: [id number] DOB: 02-01-1951  Robin Murillo is an 70 y.o. year old female who presents for his initial CCM visit with the clinical pharmacist.   Recent office visits:  01/29/21 Agapito Games MD - Seen for Diabetes - Labs ordered - No medication changes noted - Follow up in 4 months  10/19/20 Clayborne Dana NP - Video visit seen for Viral upper respiratory tract infection - No medication changes noted - No follow up noted  Recent consult visits:  02/13/21 Louie Bun - Cardiology - Seen for Ventricular tachycardia - No notes available  02/08/21 Chi Hoy Register PA - Gastroenterologist - Seen for Gastro-esophageal reflux disease - No notes available  11/16/20 Bertram Savin NP - Cardiology - Seen for atrial flutter - No medication changes noted - Follow up in 6 months 11/16/20 Daymon Larsen - Cardiology - Seen for atrial flutter - No notes available  11/06/20 Louie Bun - Cardiology - Seen for Ventricular tachycardia - No notes available     Hospital visits:  None in previous 6 months  Medications: Outpatient Encounter Medications as of 04/17/2021  Medication Sig   albuterol (VENTOLIN HFA) 108 (90 Base) MCG/ACT inhaler Inhale 2 puffs into the lungs every 6 (six) hours as needed for wheezing or shortness of breath.   ALPRAZolam (XANAX) 0.25 MG tablet Take 1 tablet (0.25 mg total) by mouth daily as needed for anxiety.   Ascorbic Acid (VITAMIN C) 100 MG tablet Take 100 mg by mouth daily.   aspirin 81 MG tablet Take 81 mg by mouth daily.   atorvastatin (LIPITOR) 20 MG tablet TAKE 1 TABLET (20 MG) BY MOUTH NIGHTLY   Cholecalciferol (VITAMIN D PO) Take 2,000 Int'l Units by mouth daily.    FLUoxetine (PROZAC) 20 MG capsule TAKE 3 CAPSULES EVERY DAY   fluticasone furoate-vilanterol (BREO ELLIPTA) 100-25 MCG/INH AEPB Inhale 1 puff into the lungs daily.   levothyroxine (SYNTHROID) 125 MCG tablet TAKE 1  TABLET EVERY DAY   lisinopril (ZESTRIL) 5 MG tablet TAKE 1 TABLET (5 MG TOTAL) BY MOUTH DAILY.   magnesium oxide (MAG-OX) 400 MG tablet Take 400 mg by mouth 2 (two) times daily.   metFORMIN (GLUCOPHAGE-XR) 500 MG 24 hr tablet TAKE 1 TABLET BY MOUTH EVERY DAY WITH BREAKFAST   metoprolol (LOPRESSOR) 50 MG tablet Take 50 mg by mouth 2 (two) times daily.   Multiple Vitamins-Minerals (ZINC PO) Take 1 tablet by mouth daily.   pantoprazole (PROTONIX) 40 MG tablet TAKE 1 TABLET (40 MG TOTAL) BY MOUTH DAILY.   No facility-administered encounter medications on file as of 04/17/2021.    Care Gaps: TETANUS/TDAP Last completed: May 10, 2008 OPHTHALMOLOGY EXAM Last completed: Aug 03, 2019 INFLUENZA VACCINE Last completed: May 29, 2020  albuterol [facility]) [phone] Base) MCG/ACT inhaler - Last fill 10/19/20 ALPRAZolam (XANAX) 0.25 MG tablet- Last fill 09/21/20 10 DS Ascorbic Acid (VITAMIN C) 100 MG tablet- Last fill 09/21/20 aspirin 81 MG tablet- Last fill 12/02/11 atorvastatin (LIPITOR) 20 MG tablet- Last fill 01/30/21 90 DS Cholecalciferol (VITAMIN D PO) - Last fill 12/02/11 FLUoxetine (PROZAC) 20 MG capsule- Last fill 02/20/21 90 DS fluticasone furoate-vilanterol (BREO ELLIPTA) 100-25 MCG/INH AEPB- Last fill 12/28/2020 30 DS levothyroxine (SYNTHROID) 125 MCG tablet- Last fill 01/17/21 90 DS lisinopril (ZESTRIL) 5 MG tablet- Last fill 01/30/21 90 DS magnesium oxide (MAG-OX) 400 MG tablet- Last fill 12/02/11 metFORMIN (GLUCOPHAGE-XR) 500 MG 24 hr  tablet- Last fill 01/29/2021 90 DS metoprolol (LOPRESSOR) 50 MG tablet- Last fill 02/12/2021 90 DS Multiple Vitamins-Minerals (ZINC PO) - Last fill 01/29/21 pantoprazole (PROTONIX) 40 MG tablet- Last fill 01/10/2021 90 DS     Star Rating Drugs: atorvastatin (LIPITOR) 20 MG tablet- Last fill 01/30/21 90 DS lisinopril (ZESTRIL) 5 MG tablet- Last fill 01/30/21 90 DS metFORMIN (GLUCOPHAGE-XR) 500 MG 24 hr tablet- Last fill 01/29/2021 90  DS    Salli Real, CMA

## 2021-04-18 ENCOUNTER — Ambulatory Visit (INDEPENDENT_AMBULATORY_CARE_PROVIDER_SITE_OTHER): Payer: Medicare HMO

## 2021-04-18 ENCOUNTER — Other Ambulatory Visit: Payer: Self-pay

## 2021-04-18 DIAGNOSIS — E119 Type 2 diabetes mellitus without complications: Secondary | ICD-10-CM | POA: Diagnosis not present

## 2021-04-18 DIAGNOSIS — G8911 Acute pain due to trauma: Secondary | ICD-10-CM | POA: Diagnosis not present

## 2021-04-18 DIAGNOSIS — Z79899 Other long term (current) drug therapy: Secondary | ICD-10-CM | POA: Diagnosis not present

## 2021-04-18 DIAGNOSIS — S7002XA Contusion of left hip, initial encounter: Secondary | ICD-10-CM | POA: Diagnosis not present

## 2021-04-18 DIAGNOSIS — M549 Dorsalgia, unspecified: Secondary | ICD-10-CM | POA: Diagnosis not present

## 2021-04-18 DIAGNOSIS — J986 Disorders of diaphragm: Secondary | ICD-10-CM | POA: Diagnosis not present

## 2021-04-18 DIAGNOSIS — I10 Essential (primary) hypertension: Secondary | ICD-10-CM | POA: Diagnosis not present

## 2021-04-18 DIAGNOSIS — M8580 Other specified disorders of bone density and structure, unspecified site: Secondary | ICD-10-CM | POA: Diagnosis not present

## 2021-04-18 DIAGNOSIS — J449 Chronic obstructive pulmonary disease, unspecified: Secondary | ICD-10-CM | POA: Diagnosis not present

## 2021-04-18 DIAGNOSIS — Z1231 Encounter for screening mammogram for malignant neoplasm of breast: Secondary | ICD-10-CM

## 2021-04-18 DIAGNOSIS — R918 Other nonspecific abnormal finding of lung field: Secondary | ICD-10-CM | POA: Diagnosis not present

## 2021-04-18 DIAGNOSIS — E079 Disorder of thyroid, unspecified: Secondary | ICD-10-CM | POA: Diagnosis not present

## 2021-04-18 DIAGNOSIS — S40012A Contusion of left shoulder, initial encounter: Secondary | ICD-10-CM | POA: Diagnosis not present

## 2021-04-18 DIAGNOSIS — S3992XA Unspecified injury of lower back, initial encounter: Secondary | ICD-10-CM | POA: Diagnosis not present

## 2021-04-18 DIAGNOSIS — Z780 Asymptomatic menopausal state: Secondary | ICD-10-CM | POA: Diagnosis not present

## 2021-04-18 DIAGNOSIS — Z87891 Personal history of nicotine dependence: Secondary | ICD-10-CM | POA: Diagnosis not present

## 2021-04-18 DIAGNOSIS — Z95810 Presence of automatic (implantable) cardiac defibrillator: Secondary | ICD-10-CM | POA: Diagnosis not present

## 2021-04-18 DIAGNOSIS — M85852 Other specified disorders of bone density and structure, left thigh: Secondary | ICD-10-CM | POA: Diagnosis not present

## 2021-04-18 DIAGNOSIS — R079 Chest pain, unspecified: Secondary | ICD-10-CM | POA: Diagnosis not present

## 2021-04-20 ENCOUNTER — Telehealth (INDEPENDENT_AMBULATORY_CARE_PROVIDER_SITE_OTHER): Payer: Medicare HMO | Admitting: Family Medicine

## 2021-04-20 ENCOUNTER — Telehealth: Payer: Self-pay | Admitting: Family Medicine

## 2021-04-20 ENCOUNTER — Encounter: Payer: Self-pay | Admitting: Family Medicine

## 2021-04-20 DIAGNOSIS — W010XXA Fall on same level from slipping, tripping and stumbling without subsequent striking against object, initial encounter: Secondary | ICD-10-CM | POA: Diagnosis not present

## 2021-04-20 DIAGNOSIS — M549 Dorsalgia, unspecified: Secondary | ICD-10-CM

## 2021-04-20 MED ORDER — HYDROCODONE-ACETAMINOPHEN 5-325 MG PO TABS: 1.0000 | ORAL_TABLET | Freq: Four times a day (QID) | ORAL | 0 refills | Status: AC | PRN

## 2021-04-20 MED ORDER — TIZANIDINE HCL 4 MG PO TABS: 4.0000 mg | ORAL_TABLET | Freq: Three times a day (TID) | ORAL | 0 refills | Status: DC | PRN

## 2021-04-20 NOTE — Telephone Encounter (Signed)
Patient fell and went to the hospital. She is in a lot of pain. Was seen at [facility] in [place]. Was told to follow up ASAP. Only acute slots open. Please advise.

## 2021-04-20 NOTE — Telephone Encounter (Signed)
Reviewed ED note.  No fractures.  Okay to use an acute next week.

## 2021-04-20 NOTE — Progress Notes (Signed)
Virtual Visit via Video Note  I connected with Robin Murillo on 04/20/21 at  2:40 PM EDT by a video enabled telemedicine application and verified that I am speaking with the correct person using two identifiers.   I discussed the limitations of evaluation and management by telemedicine and the availability of in person appointments. The patient expressed understanding and agreed to proceed.  Patient location: at home Provider location: in office  Subjective:    CC: ED f?U   HPI:  Pt was seen in the ED 2 days (04/18/2021) ago for a fall. She was in the bathtub and slipped and fell.  She was getting ready to go for her mammo and DEXA.  She said that she wasn't given anything for her pain. She has been taking tylenol,IBU,hasn't used ice or heat. Can lift her shoulders, but it is painful.  She says is mostly in her upper back between her shoulder blades.  I did do an x-ray in the emergency room and did not show a fracture.  She says its been really hard to sleep.   Pain is mostly in her back and between her shoulders and her pain is a 10/10 and said that it feels like she's being stabbed with knives.     Past medical history, Surgical history, Family history not pertinant except as noted below, Social history, Allergies, and medications have been entered into the medical record, reviewed, and corrections made.    Objective:    General: Speaking clearly in complete sentences without any shortness of breath.  Alert and oriented x3.  Normal judgment. No apparent acute distress.    Impression and Recommendations:    No problem-specific Assessment & Plan notes found for this encounter.  Fall from slippery surface in the bathtub/upper back pain.  She feels like she is having significant 10 out of 10 pain she says she feels like it is much worse than when she went to the emergency room 2 days ago.  We discussed a trial of a muscle relaxer she has taken those before and did well with them and  felt like they were helpful I did warn about potential for sedation to be careful with the medications she is not currently driving and has not for months.  We also discussed a low-dose of hydrocodone to take as needed and start with half a tab and use just sparingly as needed.  If not improving then I like to see her back next week to see if we might need to repeat some x-rays.  No orders of the defined types were placed in this encounter.   Meds ordered this encounter  Medications   HYDROcodone-acetaminophen (NORCO/VICODIN) 5-325 MG tablet    Sig: Take 1 tablet by mouth every 6 (six) hours as needed for up to 5 days for moderate pain.    Dispense:  30 tablet    Refill:  0   tiZANidine (ZANAFLEX) 4 MG tablet    Sig: Take 1 tablet (4 mg total) by mouth every 8 (eight) hours as needed for muscle spasms.    Dispense:  30 tablet    Refill:  0   I discussed the assessment and treatment plan with the patient. The patient was provided an opportunity to ask questions and all were answered. The patient agreed with the plan and demonstrated an understanding of the instructions.   The patient was advised to call back or seek an in-person evaluation if the symptoms worsen or if the condition fails  to improve as anticipated.   Beatrice Lecher, MD

## 2021-04-20 NOTE — Progress Notes (Signed)
Hi Robin Murillo, bone density test shows a T score of -2.0 this puts you into the mildly thin bone category called osteopenia.   The current recommendation for osteopenia (mildly thin bones) treatment includes:   #1 calcium-total of 1200 mg of calcium daily.  If you eat a very calcium rich diet you may be able to obtain that without a supplement.  If not, then I recommend calcium 500 mg twice a day.  There are several products over-the-counter such as Caltrate D and Viactiv chews which are great options that contain calcium and vitamin D. #2 vitamin D-recommend 800 international units daily. #3 exercise-recommend 30 minutes of weightbearing exercise 3 days a week.  Resistance training ,such as doing bands and light weights, can be particularly helpful.

## 2021-04-20 NOTE — Progress Notes (Signed)
Pt was seen in the ED 2 days ago for a fall. She said that she wasn't given anything for her pain. She has been taking tylenol,IBU,hasn't used ice or heat.   Pain is mostly in her back and between her shoulders and her pain is a 10/10 and said that it feels like she's being stabbed with knives.

## 2021-04-23 ENCOUNTER — Ambulatory Visit (INDEPENDENT_AMBULATORY_CARE_PROVIDER_SITE_OTHER): Payer: Medicare HMO | Admitting: Pharmacist

## 2021-04-23 ENCOUNTER — Other Ambulatory Visit: Payer: Self-pay

## 2021-04-23 DIAGNOSIS — J449 Chronic obstructive pulmonary disease, unspecified: Secondary | ICD-10-CM

## 2021-04-23 DIAGNOSIS — E119 Type 2 diabetes mellitus without complications: Secondary | ICD-10-CM

## 2021-04-23 NOTE — Progress Notes (Signed)
Please call patient. Normal mammogram.  Repeat in 1 year.  

## 2021-04-23 NOTE — Patient Instructions (Signed)
Visit Information   PATIENT GOALS:   Goals Addressed             This Visit's Progress    Medication Management       Patient Goals/Self-Care Activities Over the next 90 days, patient will:  take medications as prescribed  Follow Up Plan: Telephone follow up appointment with care management team member scheduled for:  3 months         Consent to CCM Services: Ms. Giddings was given information about Chronic Care Management services including:  CCM service includes personalized support from designated clinical staff supervised by her physician, including individualized plan of care and coordination with other care providers 24/7 contact phone numbers for assistance for urgent and routine care needs. Service will only be billed when office clinical staff spend 20 minutes or more in a month to coordinate care. Only one practitioner may furnish and bill the service in a calendar month. The patient may stop CCM services at any time (effective at the end of the month) by phone call to the office staff. The patient will be responsible for cost sharing (co-pay) of up to 20% of the service fee (after annual deductible is met).  Patient agreed to services and verbal consent obtained.   Patient verbalizes understanding of instructions provided today and agrees to view in [place].   Telephone follow up appointment with care management team member scheduled for: 3 months  Darius Bump   CLINICAL CARE PLAN: Patient Care Plan: Medication Management     Problem Identified: DM, COPD      Long-Range Goal: Disease Progression Prevention   Start Date: 04/23/2021  This Visit's Progress: On track  Priority: High  Note:   Current Barriers:  None at present Difficulty swallowing some medications, reviewed all for safety in crushing/modified administration methods.   Pharmacist Clinical Goal(s):  Over the next 90 days, patient will maintain control of chronic conditions  as evidenced  by mediation fill history, lab values, and vital signs  through collaboration with PharmD and provider.   Interventions: 1:1 collaboration with Hali Marry, MD regarding development and update of comprehensive plan of care as evidenced by provider attestation and co-signature Inter-disciplinary care team collaboration (see longitudinal plan of care) Comprehensive medication review performed; medication list updated in electronic medical record  Diabetes:  Controlled; current treatment:metformin XR 579m daily; a1c 5.7  Current glucose readings: not currently checking  Denies hypoglycemic/hyperglycemic symptoms  Current meal patterns: breakfast: cereal or oatmeal; lunch: tKuwaitsandwich; dinner: subway sandwiches, bowl of soup; snacks: applesauce, pudding yogurt, nuts,  drinks: water, one diet dr pepper w/dinner   Current exercise: arm circles, some walking  Educated on glucose/a1c goals, metformin drug information, as well as benefits of ACEI and statin  Recommended continue current regimen    Chronic Obstructive Pulmonary Disease:  Controlled; current treatment:breo ellipta daily, albuterol PRN;   0 exacerbations requiring treatment in the last 6 months   Recommended continue current regimen  Patient Goals/Self-Care Activities Over the next 90 days, patient will:  take medications as prescribed  Follow Up Plan: Telephone follow up appointment with care management team member scheduled for:  3 months

## 2021-04-23 NOTE — Progress Notes (Signed)
Chronic Care Management Pharmacy Note  04/23/2021 Name:  Robin Murillo MRN:  [id number] DOB:  05-17-1951  Summary: addressed DM, COPD. Patient mentioned some difficulty swallowing some meds. Reviewed all medications for safety in crushing, and alternative administration techniques. She places in pudding to assist. All can be crushed or opened except Metformin XR. Do not recommend to adjust formulation at this time because patient mentions using pudding is working for her.  Also answered patients questions, provided education on why statin is prescribed for PM dosing. States she was taking in the morning, which is okay if it is easier to remember. Patient already taking some medications in PM, so she plans to move statin to PM. Patient had a fall in bathtub recently, admits she was not using her shower chair as she should. Reinforced importance of using assistive devices, also recommended non-skid mat for the bathtub.  Recommendations/Changes made from today's visit: None  Plan: f/u with pharmacist in 3 months  Subjective: Robin Murillo is an 70 y.o. year old female who is a primary patient of Metheney, Rene Kocher, MD.  The CCM team was consulted for assistance with disease management and care coordination needs.    Engaged with patient by telephone for initial visit in response to provider referral for pharmacy case management and/or care coordination services.   Consent to Services:  The patient was given information about Chronic Care Management services, agreed to services, and gave verbal consent prior to initiation of services.  Please see initial visit note for detailed documentation.   Patient Care Team: Hali Marry, MD as PCP - General (Family Medicine) Dionne Milo, MD as Referring Physician (Pulmonary Disease) Bonney Aid, MD as Referring Physician (Cardiology) Darius Bump, [facility] as Pharmacist (Pharmacist)  Recent office visits:  01/29/21 Hali Marry MD - Seen for Diabetes - Labs ordered - No medication changes noted - Follow up in 4 months  10/19/20 Terrilyn Saver NP - Video visit seen for Viral upper respiratory tract infection - No medication changes noted - No follow up noted   Recent consult visits:  02/13/21 Bonney Aid - Cardiology - Seen for Ventricular tachycardia - No notes available  02/08/21 Chi Cheree Ditto PA - Gastroenterologist - Seen for Gastro-esophageal reflux disease - No notes available  11/16/20 Carlyle Dolly NP - Cardiology - Seen for atrial flutter - No medication changes noted - Follow up in 6 months 11/16/20 Rayvon Char - Cardiology - Seen for atrial flutter - No notes available  11/06/20 Bonney Aid - Cardiology - Seen for Ventricular tachycardia - No notes available        Hospital visits:  None in previous 6 months  Objective:  Lab Results  Component Value Date   CREATININE 0.84 09/21/2020   CREATININE 1.10 (H) 05/19/2019   CREATININE 0.82 04/02/2018    Lab Results  Component Value Date   HGBA1C 5.7 (A) 01/29/2021   Last diabetic Eye exam:  Lab Results  Component Value Date/Time   HMDIABEYEEXA No Retinopathy 11/21/2014 12:00 AM    Last diabetic Foot exam: No results found for: HMDIABFOOTEX      Component Value Date/Time   CHOL 126 09/21/2020 0000   TRIG 54 09/21/2020 0000   HDL 57 09/21/2020 0000   CHOLHDL 2.2 09/21/2020 0000   VLDL 19 07/18/2016 0956   LDLCALC 56 09/21/2020 0000    Hepatic Function Latest Ref Rng & Units 09/21/2020 05/19/2019 04/02/2018  Total Protein 6.1 -  8.1 g/dL 6.8 7.7 6.8  Albumin 3.6 - 5.1 g/dL - - -  AST 10 - 35 U/L 27 22 20   ALT 6 - 29 U/L 32(H) 23 18  Alk Phosphatase 33 - 130 U/L - - -  Total Bilirubin 0.2 - 1.2 mg/dL 0.4 0.5 0.5    Lab Results  Component Value Date/Time   TSH 0.57 09/21/2020 12:00 AM   TSH 2.42 05/19/2019 09:15 AM    CBC Latest Ref Rng & Units 09/21/2020 04/21/2018  WBC 3.8 - 10.8 Thousand/uL 7.6 7.8   Hemoglobin 11.7 - 15.5 g/dL 13.7 14.3  Hematocrit 35.0 - 45.0 % 43.1 43.8  Platelets 140 - 400 Thousand/uL 305 313    Lab Results  Component Value Date/Time   VD25OH 34 09/21/2020 12:00 AM   VD25OH 33 04/21/2018 07:45 AM    Social History   Tobacco Use  Smoking Status Former   Types: Cigarettes   Quit date: 04/04/2003   Years since quitting: 18.0  Smokeless Tobacco Never   BP Readings from Last 3 Encounters:  01/29/21 (!) 121/49  09/21/20 (!) 138/56  09/16/19 (!) 117/51   Pulse Readings from Last 3 Encounters:  01/29/21 82  09/21/20 71  09/16/19 73   Wt Readings from Last 3 Encounters:  01/29/21 238 lb (108 kg)  09/21/20 241 lb (109.3 kg)  06/02/20 240 lb (108.9 kg)    Assessment: Review of patient past medical history, allergies, medications, health status, including review of consultants reports, laboratory and other test data, was performed as part of comprehensive evaluation and provision of chronic care management services.   SDOH:  (Social Determinants of Health) assessments and interventions performed:    CCM Care Plan  Allergies  Allergen Reactions   Morphine And Related Swelling    Swelling in face   Oxycodone Other (See Comments)    Felt really spacey    Medications Reviewed Today     Reviewed by Teddy Spike, CMA (Certified Medical Assistant) on 04/20/21 at 1449  Med List Status: <None>   Medication Order Taking? Sig Documenting Provider Last Dose Status Informant  albuterol (VENTOLIN HFA) 108 (90 Base) MCG/ACT inhaler [id number] Yes Inhale 2 puffs into the lungs every 6 (six) hours as needed for wheezing or shortness of breath. Terrilyn Saver, NP Taking Active   ALPRAZolam Duanne Moron) 0.25 MG tablet [id number] Yes Take 1 tablet (0.25 mg total) by mouth daily as needed for anxiety. Hali Marry, MD Taking Active   Ascorbic Acid (VITAMIN C) 100 MG tablet [id number] Yes Take 100 mg by mouth daily. [provider] Taking Active Self   aspirin 81 MG tablet [id number] Yes Take 81 mg by mouth daily. [provider] Taking Active   atorvastatin (LIPITOR) 20 MG tablet [id number] Yes TAKE 1 TABLET (20 MG) BY MOUTH NIGHTLY Hali Marry, MD Taking Active   Cholecalciferol (VITAMIN D PO) [id number] Yes Take 2,000 Int'l Units by mouth daily.  [provider] Taking Active   FLUoxetine (PROZAC) 20 MG capsule [id number] Yes TAKE 3 CAPSULES EVERY DAY Hali Marry, MD Taking Active   fluticasone furoate-vilanterol (BREO ELLIPTA) 100-25 MCG/INH AEPB [id number] Yes Inhale 1 puff into the lungs daily. Orma Render, NP Taking Active   levothyroxine (SYNTHROID) 125 MCG tablet [id number] Yes TAKE 1 TABLET EVERY DAY Hali Marry, MD Taking Active   lisinopril (ZESTRIL) 5 MG tablet [id number] Yes TAKE 1 TABLET (5 MG TOTAL) BY MOUTH DAILY. Hali Marry, MD Taking Active  magnesium oxide (MAG-OX) 400 MG tablet [id number] Yes Take 400 mg by mouth 2 (two) times daily. [provider] Taking Active   metFORMIN (GLUCOPHAGE-XR) 500 MG 24 hr tablet [id number] Yes TAKE 1 TABLET BY MOUTH EVERY DAY WITH BREAKFAST Hali Marry, MD Taking Active   metoprolol (LOPRESSOR) 50 MG tablet [id number] Yes Take 50 mg by mouth 2 (two) times daily. [provider] Taking Active   pantoprazole (PROTONIX) 40 MG tablet [id number] Yes TAKE 1 TABLET (40 MG TOTAL) BY MOUTH DAILY. 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   Cardiac defibrillator in place 01/17/2015   GERD (gastroesophageal reflux disease) 10/12/2014   AR (allergic rhinitis) 09/21/2013   COPD (chronic obstructive pulmonary disease)  ([place]) 04/21/2013   Obstructive sleep apnea 02/26/2013   History of ventricular fibrillation 02/22/2013   Pacemaker 12/24/2011   Obesity, Class III, BMI 40-49.9 (morbid obesity) ([place]) 12/04/2011   Diabetes type 2, controlled ([place]) 12/04/2011   Hypothyroid 12/02/2011   Depression with anxiety 12/02/2011   Vitamin D deficiency 06/06/2010   Hereditary and idiopathic peripheral neuropathy 06/05/2010    Immunization History  Administered Date(s) Administered   Fluad Quad(high Dose 65+) 05/04/2019, 05/29/2020   Influenza, High Dose Seasonal PF 04/24/2017, 04/02/2018   Influenza,inj,Quad PF,6+ Mos 04/27/2014, 05/05/2015, 04/19/2016   Influenza-Unspecified 04/08/2013, 05/04/2019, 05/29/2020   PFIZER(Purple Top)SARS-COV-2 Vaccination 08/10/2019, 08/27/2019   Pneumococcal Conjugate-13 01/15/2016   Pneumococcal Polysaccharide-23 04/21/2013, 09/21/2020   Td 05/10/2008   Tdap 05/10/2008   Zoster Recombinat (Shingrix) 10/12/2020, 12/29/2020   Zoster, Live 10/01/2012    Conditions to be addressed/monitored: COPD and DMII  There are no care plans that you recently modified to display for this patient.   Medication Assistance: None required.  Patient affirms current coverage meets needs.  Patient's preferred pharmacy is:  [facility] [place], [place] [address] Phone: [phone] Fax: [phone]  [place], [place] - [address] [place] [address] Phone: [phone] Fax: [place], [place] - [address] [place] [address] Phone: [phone] Fax: [phone]  Uses pill box? No - has bottles separate for AM and PM and a routine Pt endorses 100% compliance  Follow Up:  Patient agrees to Care Plan and Follow-up.  Plan: Telephone follow up appointment with care  management team member scheduled for:  3 months  Larinda Buttery, PharmD Clinical Pharmacist [facility] [phone]

## 2021-04-23 NOTE — Telephone Encounter (Signed)
Appointment not needed.

## 2021-05-14 DIAGNOSIS — E119 Type 2 diabetes mellitus without complications: Secondary | ICD-10-CM | POA: Diagnosis not present

## 2021-05-14 DIAGNOSIS — J449 Chronic obstructive pulmonary disease, unspecified: Secondary | ICD-10-CM

## 2021-06-04 ENCOUNTER — Ambulatory Visit (INDEPENDENT_AMBULATORY_CARE_PROVIDER_SITE_OTHER): Payer: Medicare HMO | Admitting: Family Medicine

## 2021-06-04 ENCOUNTER — Encounter: Payer: Self-pay | Admitting: Family Medicine

## 2021-06-04 ENCOUNTER — Other Ambulatory Visit: Payer: Self-pay

## 2021-06-04 VITALS — BP 130/52 | HR 86 | Ht 63.0 in | Wt 238.0 lb

## 2021-06-04 DIAGNOSIS — J449 Chronic obstructive pulmonary disease, unspecified: Secondary | ICD-10-CM | POA: Diagnosis not present

## 2021-06-04 DIAGNOSIS — Z23 Encounter for immunization: Secondary | ICD-10-CM | POA: Diagnosis not present

## 2021-06-04 DIAGNOSIS — E039 Hypothyroidism, unspecified: Secondary | ICD-10-CM

## 2021-06-04 DIAGNOSIS — E1129 Type 2 diabetes mellitus with other diabetic kidney complication: Secondary | ICD-10-CM

## 2021-06-04 DIAGNOSIS — Z6841 Body Mass Index (BMI) 40.0 and over, adult: Secondary | ICD-10-CM | POA: Diagnosis not present

## 2021-06-04 DIAGNOSIS — E119 Type 2 diabetes mellitus without complications: Secondary | ICD-10-CM | POA: Diagnosis not present

## 2021-06-04 DIAGNOSIS — R809 Proteinuria, unspecified: Secondary | ICD-10-CM | POA: Diagnosis not present

## 2021-06-04 LAB — POCT GLYCOSYLATED HEMOGLOBIN (HGB A1C): Hemoglobin A1C: 5.5 % (ref 4.0–5.6)

## 2021-06-04 MED ORDER — FLUTICASONE FUROATE-VILANTEROL 100-25 MCG/ACT IN AEPB: 1.0000 | INHALATION_SPRAY | Freq: Every day | RESPIRATORY_TRACT | 6 refills | Status: DC

## 2021-06-04 NOTE — Assessment & Plan Note (Signed)
Continue lisinopril

## 2021-06-04 NOTE — Assessment & Plan Note (Signed)
Continue to work on healthy diet and regular exercise and weight loss.

## 2021-06-04 NOTE — Progress Notes (Signed)
Call patient: Kidney function is stable.

## 2021-06-04 NOTE — Patient Instructions (Signed)
Ok to stop and hold your metformin. We will see if your A1C still looks good.

## 2021-06-04 NOTE — Assessment & Plan Note (Addendum)
Stable on current regimen.  Due to recheck TSH.  She takes her medication regularly.

## 2021-06-04 NOTE — Assessment & Plan Note (Signed)
Stable on current regimen.  I am not sure how consistently she is using her prophylactic inhaler versus her albuterol inhaler but I did widen send a refill to the pharmacy.  She mention she was taking Advair but we actually had Breo on her medication list so I did go ahead and refill the [facility] since it is easier to take once a day.  And then okay to use albuterol as needed on top of that if needed.

## 2021-06-04 NOTE — Progress Notes (Signed)
Established Patient Office Visit  Subjective:  Patient ID: Robin Murillo, female    DOB: 1951-03-11  Age: 70 y.o. MRN: [id number]  CC:  Chief Complaint  Patient presents with   Diabetes    HPI Eliany Hempfling presents for   Diabetes - no hypoglycemic events. No wounds or sores that are not healing well. No increased thirst or urination. Checking glucose at home. Taking medications as prescribed without any side effects.  F/U COPD -no recent flares.  That she was sick recently but is actually feeling much much better.  She still using her Advair daily and says she is still grinding her albuterol daily she just keeps in her pocket and uses it if she feels a little winded.  No change in cough or sputum production.  Hypothyroidism - Taking medication regularly in the AM away from food and vitamins, etc. No recent change to skin, hair, or energy levels.   Past Medical History:  Diagnosis Date   Heart disease    pacemaker/defiberlator   Thyroid disease     Past Surgical History:  Procedure Laterality Date   CARDIAC DEFIBRILLATOR PLACEMENT     06/2010 at [place]     06/2010   PACEMAKER PLACEMENT     06/2010 at [place] History  Problem Relation Age of Onset   Heart attack Brother    Heart disease Sister        Murmur   Thyroid disease Sister    Diabetes Brother     Social History   Socioeconomic History   Marital status: Married    Spouse name: Simona Huh   Number of children: 3   Years of education: 12   Highest education level: 12th grade  Occupational History   Occupation: disability    Comment: office work  Tobacco Use   Smoking status: Former    Types: Cigarettes    Quit date: 04/04/2003    Years since quitting: 18.1   Smokeless tobacco: Never  Vaping Use   Vaping Use: Never used  Substance and Sexual Activity   Alcohol use: No   Drug use: No   Sexual activity: Not Currently  Other Topics  Concern   Not on file  Social History Narrative   1 caffeine dirink per day. No regular exercise.    Social Determinants of Health   Financial Resource Strain: Low Risk    Difficulty of Paying Living Expenses: Not hard at all  Food Insecurity: No Food Insecurity   Worried About Charity fundraiser in the Last Year: Never true   [place] in the Last Year: Never true  Transportation Needs: No Transportation Needs   Lack of Transportation (Medical): No   Lack of Transportation (Non-Medical): No  Physical Activity: Inactive   Days of Exercise per Week: 0 days   Minutes of Exercise per Session: 0 min  Stress: No Stress Concern Present   Feeling of Stress : Not at all  Social Connections: Moderately Isolated   Frequency of Communication with Friends and Family: Twice a week   Frequency of Social Gatherings with Friends and Family: More than three times a week   Attends Religious Services: Never   Marine scientist or Organizations: No   Attends Archivist Meetings: Never   Marital Status: Married  Human resources officer Violence: Not At Risk   Fear of Current or Ex-Partner: No   Emotionally Abused:  No   Physically Abused: No   Sexually Abused: No    Outpatient Medications Prior to Visit  Medication Sig Dispense Refill   albuterol (VENTOLIN HFA) 108 (90 Base) MCG/ACT inhaler Inhale 2 puffs into the lungs every 6 (six) hours as needed for wheezing or shortness of breath. 18 g prn   ALPRAZolam (XANAX) 0.25 MG tablet Take 1 tablet (0.25 mg total) by mouth daily as needed for anxiety. 10 tablet 0   aspirin 81 MG tablet Take 81 mg by mouth daily.     atorvastatin (LIPITOR) 20 MG tablet TAKE 1 TABLET (20 MG) BY MOUTH NIGHTLY 90 tablet 3   Cholecalciferol (VITAMIN D PO) Take 2,000 Int'l Units by mouth daily.      FLUoxetine (PROZAC) 20 MG capsule TAKE 3 CAPSULES EVERY DAY 270 capsule 1   levothyroxine (SYNTHROID) 125 MCG tablet TAKE 1 TABLET EVERY DAY 90 tablet 1    lisinopril (ZESTRIL) 5 MG tablet TAKE 1 TABLET (5 MG TOTAL) BY MOUTH DAILY. 90 tablet 3   magnesium oxide (MAG-OX) 400 MG tablet Take 400 mg by mouth 2 (two) times daily.     metoprolol (LOPRESSOR) 50 MG tablet Take 50 mg by mouth 2 (two) times daily.     pantoprazole (PROTONIX) 40 MG tablet TAKE 1 TABLET (40 MG TOTAL) BY MOUTH DAILY. 90 tablet 3   tiZANidine (ZANAFLEX) 4 MG tablet Take 1 tablet (4 mg total) by mouth every 8 (eight) hours as needed for muscle spasms. 30 tablet 0   vitamin C (ASCORBIC ACID) 500 MG tablet Take 500 mg by mouth daily.     zinc gluconate 50 MG tablet Take 50 mg by mouth daily.     fluticasone furoate-vilanterol (BREO ELLIPTA) 100-25 MCG/INH AEPB Inhale 1 puff into the lungs daily. 60 each 5   metFORMIN (GLUCOPHAGE-XR) 500 MG 24 hr tablet TAKE 1 TABLET BY MOUTH EVERY DAY WITH BREAKFAST 15 tablet 0   No facility-administered medications prior to visit.    Allergies  Allergen Reactions   Morphine And Related Swelling    Swelling in face   Oxycodone Other (See Comments)    Felt really spacey    ROS Review of Systems    Objective:    Physical Exam Constitutional:      Appearance: Normal appearance. She is well-developed.  HENT:     Head: Normocephalic and atraumatic.  Cardiovascular:     Rate and Rhythm: Normal rate and regular rhythm.     Heart sounds: Normal heart sounds.  Pulmonary:     Effort: Pulmonary effort is normal.     Breath sounds: Normal breath sounds.  Skin:    General: Skin is warm and dry.  Neurological:     Mental Status: She is alert and oriented to person, place, and time.  Psychiatric:        Behavior: Behavior normal.    BP (!) 130/52   Pulse 86   Ht 5\' 3"  (1.6 m)   Wt 238 lb (108 kg)   SpO2 93%   BMI 42.16 kg/m  Wt Readings from Last 3 Encounters:  06/04/21 238 lb (108 kg)  01/29/21 238 lb (108 kg)  09/21/20 241 lb (109.3 kg)     Health Maintenance Due  Topic Date Due   OPHTHALMOLOGY EXAM  08/02/2020     There are no preventive care reminders to display for this patient.  Lab Results  Component Value Date   TSH 0.57 09/21/2020   Lab Results  Component  Value Date   WBC 7.6 09/21/2020   HGB 13.7 09/21/2020   HCT 43.1 09/21/2020   MCV 93.5 09/21/2020   PLT 305 09/21/2020   Lab Results  Component Value Date   NA 141 09/21/2020   K 4.6 09/21/2020   CO2 26 09/21/2020   GLUCOSE 99 09/21/2020   BUN 18 09/21/2020   CREATININE 0.84 09/21/2020   BILITOT 0.4 09/21/2020   ALKPHOS 155 (H) 07/18/2016   AST 27 09/21/2020   ALT 32 (H) 09/21/2020   PROT 6.8 09/21/2020   ALBUMIN 3.9 07/18/2016   CALCIUM 9.5 09/27/2020   Lab Results  Component Value Date   CHOL 126 09/21/2020   Lab Results  Component Value Date   HDL 57 09/21/2020   Lab Results  Component Value Date   LDLCALC 56 09/21/2020   Lab Results  Component Value Date   TRIG 54 09/21/2020   Lab Results  Component Value Date   CHOLHDL 2.2 09/21/2020   Lab Results  Component Value Date   HGBA1C 5.5 06/04/2021      Assessment & Plan:   Problem List Items Addressed This Visit       Respiratory   COPD (chronic obstructive pulmonary disease) ([place])    Stable on current regimen.  I am not sure how consistently she is using her prophylactic inhaler versus her albuterol inhaler but I did widen send a refill to the pharmacy.  She mention she was taking Advair but we actually had Breo on her medication list so I did go ahead and refill the [facility] since it is easier to take once a day.  And then okay to use albuterol as needed on top of that if needed.      Relevant Medications   fluticasone furoate-vilanterol (BREO ELLIPTA) 100-25 MCG/ACT AEPB     Endocrine   Microalbuminuria due to type 2 diabetes mellitus (HCC)    Continue lisinopril.      Hypothyroid    Stable on current regimen.  Due to recheck TSH.  She takes her medication regularly.      Relevant Orders   TSH   Diabetes type 2, controlled ([place]) -  Primary    A1c looks phenomenal today at 5.5 in fact, will try holding her metformin between now and the next visit to just see if she is still able to maintain with diet and exercise.  Just stressed the importance of continuing to work on those things.      Relevant Orders   POCT glycosylated hemoglobin (Hb A1C) (Completed)   TSH   BASIC METABOLIC PANEL WITH GFR     Other   Obesity, Class III, BMI 40-49.9 (morbid obesity) ([place])    Continue to work on healthy diet and regular exercise and weight loss.      Other Visit Diagnoses     BMI 40.0-44.9, adult ([place])   (Chronic)     Need for immunization against influenza       Relevant Orders   Flu Vaccine QUAD High Dose(Fluad) (Completed)       Meds ordered this encounter  Medications   fluticasone furoate-vilanterol (BREO ELLIPTA) 100-25 MCG/ACT AEPB    Sig: Inhale 1 puff into the lungs daily.    Dispense:  60 each    Refill:  6     Follow-up: Return in about 4 months (around 10/02/2021) for Diabetes follow-up.    Beatrice Lecher, MD

## 2021-06-04 NOTE — Assessment & Plan Note (Signed)
A1c looks phenomenal today at 5.5 in fact, will try holding her metformin between now and the next visit to just see if she is still able to maintain with diet and exercise.  Just stressed the importance of continuing to work on those things.

## 2021-06-05 LAB — BASIC METABOLIC PANEL WITH GFR
BUN/Creatinine Ratio: 20 (calc) (ref 6–22)
BUN: 22 mg/dL (ref 7–25)
CO2: 25 mmol/L (ref 20–32)
Calcium: 9.9 mg/dL (ref 8.6–10.4)
Chloride: 105 mmol/L (ref 98–110)
Creat: 1.08 mg/dL — ABNORMAL HIGH (ref 0.60–1.00)
Glucose, Bld: 130 mg/dL — ABNORMAL HIGH (ref 65–99)
Potassium: 4.4 mmol/L (ref 3.5–5.3)
Sodium: 139 mmol/L (ref 135–146)
eGFR: 55 mL/min/{1.73_m2} — ABNORMAL LOW (ref >=60)

## 2021-06-05 LAB — TSH: TSH: 1.53 mIU/L (ref 0.40–4.50)

## 2021-06-05 NOTE — Progress Notes (Signed)
Hi [place], thyroid looks good.

## 2021-06-22 DIAGNOSIS — I472 Ventricular tachycardia, unspecified: Secondary | ICD-10-CM | POA: Diagnosis not present

## 2021-07-25 DIAGNOSIS — I4581 Long QT syndrome: Secondary | ICD-10-CM | POA: Diagnosis not present

## 2021-07-25 DIAGNOSIS — I4892 Unspecified atrial flutter: Secondary | ICD-10-CM | POA: Diagnosis not present

## 2021-07-25 DIAGNOSIS — I4729 Other ventricular tachycardia: Secondary | ICD-10-CM | POA: Diagnosis not present

## 2021-07-25 DIAGNOSIS — I10 Essential (primary) hypertension: Secondary | ICD-10-CM | POA: Diagnosis not present

## 2021-07-30 ENCOUNTER — Ambulatory Visit (INDEPENDENT_AMBULATORY_CARE_PROVIDER_SITE_OTHER): Payer: Medicare HMO | Admitting: Pharmacist

## 2021-07-30 ENCOUNTER — Other Ambulatory Visit: Payer: Self-pay

## 2021-07-30 DIAGNOSIS — J449 Chronic obstructive pulmonary disease, unspecified: Secondary | ICD-10-CM

## 2021-07-30 DIAGNOSIS — E119 Type 2 diabetes mellitus without complications: Secondary | ICD-10-CM

## 2021-07-30 NOTE — Progress Notes (Signed)
Chronic Care Management Pharmacy Note  07/31/2021 Name:  Robin Murillo MRN:  [id number] DOB:  Jun 06, 1951  Summary: addressed DM, COPD. Medication list reviewed, patient is doing well.   Recommendations/Changes made from today's visit: None  Plan: f/u with pharmacist in 6 months  Subjective: Robin Murillo is an 71 y.o. year old female who is a primary patient of Metheney, Rene Kocher, MD.  The CCM team was consulted for assistance with disease management and care coordination needs.    Engaged with patient by telephone for follow up visit in response to provider referral for pharmacy case management and/or care coordination services.   Consent to Services:  The patient was given information about Chronic Care Management services, agreed to services, and gave verbal consent prior to initiation of services.  Please see initial visit note for detailed documentation.   Patient Care Team: Hali Marry, MD as PCP - General (Family Medicine) Dionne Milo, MD as Referring Physician (Pulmonary Disease) Bonney Aid, MD as Referring Physician (Cardiology) Darius Bump, [facility] as Pharmacist (Pharmacist) Haynes Dage, OD as Referring Physician (Optometry)  Recent office visits:  01/29/21 Hali Marry MD - Seen for Diabetes - Labs ordered - No medication changes noted - Follow up in 4 months  10/19/20 Terrilyn Saver NP - Video visit seen for Viral upper respiratory tract infection - No medication changes noted - No follow up noted   Recent consult visits:  02/13/21 Bonney Aid - Cardiology - Seen for Ventricular tachycardia - No notes available  02/08/21 Chi Cheree Ditto PA - Gastroenterologist - Seen for Gastro-esophageal reflux disease - No notes available  11/16/20 Carlyle Dolly NP - Cardiology - Seen for atrial flutter - No medication changes noted - Follow up in 6 months 11/16/20 Rayvon Char - Cardiology - Seen for atrial flutter - No notes available   11/06/20 Bonney Aid - Cardiology - Seen for Ventricular tachycardia - No notes available    Hospital visits:  None in previous 6 months  Objective:  Lab Results  Component Value Date   CREATININE 1.08 (H) 06/04/2021   CREATININE 0.84 09/21/2020   CREATININE 1.10 (H) 05/19/2019    Lab Results  Component Value Date   HGBA1C 5.5 06/04/2021   Last diabetic Eye exam:  Lab Results  Component Value Date/Time   HMDIABEYEEXA No Retinopathy 09/25/2020 12:00 AM        Component Value Date/Time   CHOL 126 09/21/2020 0000   TRIG 54 09/21/2020 0000   HDL 57 09/21/2020 0000   CHOLHDL 2.2 09/21/2020 0000   VLDL 19 07/18/2016 0956   LDLCALC 56 09/21/2020 0000    Hepatic Function Latest Ref Rng & Units 09/21/2020 05/19/2019 04/02/2018  Total Protein 6.1 - 8.1 g/dL 6.8 7.7 6.8  Albumin 3.6 - 5.1 g/dL - - -  AST 10 - 35 U/L 27 22 20   ALT 6 - 29 U/L 32(H) 23 18  Alk Phosphatase 33 - 130 U/L - - -  Total Bilirubin 0.2 - 1.2 mg/dL 0.4 0.5 0.5    Lab Results  Component Value Date/Time   TSH 1.53 06/04/2021 12:00 AM   TSH 0.57 09/21/2020 12:00 AM    CBC Latest Ref Rng & Units 09/21/2020 04/21/2018  WBC 3.8 - 10.8 Thousand/uL 7.6 7.8  Hemoglobin 11.7 - 15.5 g/dL 13.7 14.3  Hematocrit 35.0 - 45.0 % 43.1 43.8  Platelets 140 - 400 Thousand/uL 305 313    Lab Results  Component  Value Date/Time   VD25OH 34 09/21/2020 12:00 AM   VD25OH 33 04/21/2018 07:45 AM    Social History   Tobacco Use  Smoking Status Former   Types: Cigarettes   Quit date: 04/04/2003   Years since quitting: 18.3  Smokeless Tobacco Never   BP Readings from Last 3 Encounters:  06/04/21 (!) 130/52  01/29/21 (!) 121/49  09/21/20 (!) 138/56   Pulse Readings from Last 3 Encounters:  06/04/21 86  01/29/21 82  09/21/20 71   Wt Readings from Last 3 Encounters:  06/04/21 238 lb (108 kg)  01/29/21 238 lb (108 kg)  09/21/20 241 lb (109.3 kg)    Assessment: Review of patient past medical history,  allergies, medications, health status, including review of consultants reports, laboratory and other test data, was performed as part of comprehensive evaluation and provision of chronic care management services.   SDOH:  (Social Determinants of Health) assessments and interventions performed:    CCM Care Plan  Allergies  Allergen Reactions   Morphine And Related Swelling    Swelling in face   Oxycodone Other (See Comments)    Felt really spacey    Medications Reviewed Today     Reviewed by Darius Bump, [facility] (Pharmacist) on 07/30/21 at 1  Med List Status: <None>   Medication Order Taking? Sig Documenting Provider Last Dose Status Informant  albuterol (VENTOLIN HFA) 108 (90 Base) MCG/ACT inhaler [id number] Yes Inhale 2 puffs into the lungs every 6 (six) hours as needed for wheezing or shortness of breath. Terrilyn Saver, NP Taking Active   ALPRAZolam Duanne Moron) 0.25 MG tablet [id number] Yes Take 1 tablet (0.25 mg total) by mouth daily as needed for anxiety. Hali Marry, MD Taking Active   aspirin 81 MG tablet [id number] Yes Take 81 mg by mouth daily. [provider] Taking Active   atorvastatin (LIPITOR) 20 MG tablet [id number] Yes TAKE 1 TABLET (20 MG) BY MOUTH NIGHTLY Hali Marry, MD Taking Active   Cholecalciferol (VITAMIN D PO) [id number] Yes Take 2,000 Int'l Units by mouth daily.  [provider] Taking Active   FLUoxetine (PROZAC) 20 MG capsule [id number] Yes TAKE 3 CAPSULES EVERY DAY Hali Marry, MD Taking Active   fluticasone furoate-vilanterol (BREO ELLIPTA) 100-25 MCG/ACT AEPB [id number] Yes Inhale 1 puff into the lungs daily. Hali Marry, MD Taking Active   levothyroxine (SYNTHROID) 125 MCG tablet [id number] Yes TAKE 1 TABLET EVERY DAY Hali Marry, MD Taking Active   lisinopril (ZESTRIL) 5 MG tablet [id number] Yes TAKE 1 TABLET (5 MG TOTAL) BY MOUTH DAILY. Hali Marry, MD Taking Active   magnesium oxide  (MAG-OX) 400 MG tablet [id number] Yes Take 400 mg by mouth 2 (two) times daily. [provider] Taking Active   metoprolol (LOPRESSOR) 50 MG tablet [id number] Yes Take 50 mg by mouth 2 (two) times daily. [provider] Taking Active   pantoprazole (PROTONIX) 40 MG tablet [id number] Yes TAKE 1 TABLET (40 MG TOTAL) BY MOUTH DAILY. Hali Marry, MD Taking Active   tiZANidine (ZANAFLEX) 4 MG tablet [id number] No Take 1 tablet (4 mg total) by mouth every 8 (eight) hours as needed for muscle spasms.  Patient not taking: Reported on 07/30/2021   Hali Marry, MD Not Taking Active   vitamin C (ASCORBIC ACID) 500 MG tablet [id number] Yes Take 500 mg by mouth daily. [provider] Taking Active Self  zinc gluconate 50 MG tablet [id number] Yes Take 50 mg by mouth daily.  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   Cardiac defibrillator in place 01/17/2015   GERD (gastroesophageal reflux disease) 10/12/2014   AR (allergic rhinitis) 09/21/2013   COPD (chronic obstructive pulmonary disease) ([place]) 04/21/2013   Obstructive sleep apnea 02/26/2013   History of ventricular fibrillation 02/22/2013   Pacemaker 12/24/2011   Obesity, Class III, BMI 40-49.9 (morbid obesity) ([place]) 12/04/2011   Diabetes type 2, controlled ([place]) 12/04/2011   Hypothyroid 12/02/2011   Depression with anxiety 12/02/2011   Vitamin D deficiency 06/06/2010   Hereditary and idiopathic peripheral neuropathy 06/05/2010    Immunization History  Administered Date(s) Administered   Fluad Quad(high Dose 65+) 05/04/2019, 05/29/2020, 06/04/2021   Influenza, High Dose Seasonal PF 04/24/2017,  04/02/2018   Influenza,inj,Quad PF,6+ Mos 04/27/2014, 05/05/2015, 04/19/2016   Influenza-Unspecified 04/08/2013, 05/04/2019, 05/29/2020   PFIZER(Purple Top)SARS-COV-2 Vaccination 08/10/2019, 08/27/2019   Pneumococcal Conjugate-13 01/15/2016   Pneumococcal Polysaccharide-23 04/21/2013, 09/21/2020   Td 05/10/2008   Tdap 05/10/2008   Zoster Recombinat (Shingrix) 10/12/2020, 12/29/2020   Zoster, Live 10/01/2012    Conditions to be addressed/monitored: COPD and DMII  Care Plan : Medication Management  Updates made by Darius Bump, [place] since 07/31/2021 12:00 AM     Problem: DM, COPD      Long-Range Goal: Disease Progression Prevention   Start Date: 04/23/2021  Recent Progress: On track  Priority: High  Note:   Current Barriers:  None at present   Pharmacist Clinical Goal(s):  Over the next 180 days, patient will maintain control of chronic conditions  as evidenced by mediation fill history, lab values, and vital signs  through collaboration with PharmD and provider.   Interventions: 1:1 collaboration with Hali Marry, MD regarding development and update of comprehensive plan of care as evidenced by provider attestation and co-signature Inter-disciplinary care team collaboration (see longitudinal plan of care) Comprehensive medication review performed; medication list updated in electronic medical record  Diabetes:  Controlled; current treatment:metformin XR 517m daily; a1c 5.7  Current glucose readings: not currently checking  Denies hypoglycemic/hyperglycemic symptoms  Current meal patterns: breakfast: cereal or oatmeal; lunch: tKuwaitsandwich; dinner: subway sandwiches, bowl of soup; snacks: applesauce, pudding yogurt, nuts,  drinks: water, one diet dr pepper w/dinner   Current exercise: arm circles, some walking  Educated on glucose/a1c goals, metformin drug information, as well as benefits of ACEI and statin  Recommended continue current regimen    Chronic  Obstructive Pulmonary Disease:  Controlled; current treatment:breo ellipta daily, albuterol PRN;   0 exacerbations requiring treatment in the last 6 months   Recommended continue current regimen  Patient Goals/Self-Care Activities Over the next 180 days, patient will:  take medications as prescribed  Follow Up Plan: Telephone follow up appointment with care management team member scheduled for:  6 months      Medication Assistance: None required.  Patient affirms current coverage meets needs.  Patient's [facility] pharmacy is:  [facility] [place]Phone: [phone]: [place] [address] [place]Phone: [phone]: [phone]  [id number] - [place], [place] - [address] [place] [address] Phone: [phone] Fax: [phone]  Uses pill box? No - has bottles separate for AM and PM and a routine Pt endorses 100% compliance  Follow Up:  Patient agrees to Care Plan and Follow-up.  Plan: Telephone follow up appointment with care management team member scheduled for:  6 months  Larinda Buttery, PharmD Clinical Pharmacist [facility] [phone]

## 2021-07-31 NOTE — Patient Instructions (Signed)
Visit Information ° °Thank you for taking time to visit with me today. Please don't hesitate to contact me if I can be of assistance to you before our next scheduled telephone appointment. ° °Following are the goals we discussed today:  °Patient Goals/Self-Care Activities °Over the next 180 days, patient will:  °take medications as prescribed ° °Follow Up Plan: Telephone follow up appointment with care management team member scheduled for:  6 months  ° °Please call the care guide team at [phone] if you need to cancel or reschedule your appointment.  ° ° °Patient verbalizes understanding of instructions and care plan provided today and agrees to view in MyChart. Active MyChart status confirmed with patient.   ° °Robin Murillo ° °

## 2021-08-14 DIAGNOSIS — J449 Chronic obstructive pulmonary disease, unspecified: Secondary | ICD-10-CM

## 2021-08-14 DIAGNOSIS — E119 Type 2 diabetes mellitus without complications: Secondary | ICD-10-CM | POA: Diagnosis not present

## 2021-08-18 ENCOUNTER — Other Ambulatory Visit: Payer: Self-pay | Admitting: Family Medicine

## 2021-08-18 DIAGNOSIS — K2100 Gastro-esophageal reflux disease with esophagitis, without bleeding: Secondary | ICD-10-CM

## 2021-09-20 ENCOUNTER — Other Ambulatory Visit: Payer: Self-pay | Admitting: Family Medicine

## 2021-10-02 ENCOUNTER — Encounter: Payer: Self-pay | Admitting: Family Medicine

## 2021-10-02 ENCOUNTER — Ambulatory Visit (INDEPENDENT_AMBULATORY_CARE_PROVIDER_SITE_OTHER): Payer: Medicare HMO | Admitting: Family Medicine

## 2021-10-02 ENCOUNTER — Other Ambulatory Visit: Payer: Self-pay

## 2021-10-02 VITALS — BP 109/73 | HR 71 | Resp 18 | Ht 63.0 in | Wt 245.0 lb

## 2021-10-02 DIAGNOSIS — J449 Chronic obstructive pulmonary disease, unspecified: Secondary | ICD-10-CM | POA: Diagnosis not present

## 2021-10-02 DIAGNOSIS — R1310 Dysphagia, unspecified: Secondary | ICD-10-CM | POA: Diagnosis not present

## 2021-10-02 DIAGNOSIS — E119 Type 2 diabetes mellitus without complications: Secondary | ICD-10-CM | POA: Diagnosis not present

## 2021-10-02 DIAGNOSIS — Z6841 Body Mass Index (BMI) 40.0 and over, adult: Secondary | ICD-10-CM | POA: Diagnosis not present

## 2021-10-02 DIAGNOSIS — K2100 Gastro-esophageal reflux disease with esophagitis, without bleeding: Secondary | ICD-10-CM | POA: Diagnosis not present

## 2021-10-02 LAB — POCT GLYCOSYLATED HEMOGLOBIN (HGB A1C): Hemoglobin A1C: 6.4 % — AB (ref 4.0–5.6)

## 2021-10-02 MED ORDER — METFORMIN HCL ER 500 MG PO TB24: ORAL_TABLET | ORAL | 1 refills | Status: DC

## 2021-10-02 NOTE — Assessment & Plan Note (Signed)
Stable

## 2021-10-02 NOTE — Assessment & Plan Note (Signed)
Weight and A1c went up from last fall.  Just encouraged her to continue work on [employer].  We discussed also restarting her metformin.  She says she still has some at home. ?

## 2021-10-02 NOTE — Assessment & Plan Note (Signed)
Ports having some breakthrough "indigestion" even though she is taking her pantoprazole once a day she is also feeling like food is getting hung briefly for sometimes in her upper chest.  Okay to increase pantoprazole to twice a day for 2 weeks and then go back down to once a day.  Also okay to use Tums intermittently.  Referral placed to GI. ?

## 2021-10-02 NOTE — Progress Notes (Signed)
? ?Established Patient Office Visit ? ?Subjective:  ?Patient ID: Robin Murillo, female    DOB: 14-Mar-1951  Age: 71 y.o. MRN: [id number] ? ?CC:  ?Chief Complaint  ?Patient presents with  ? Diabetes  ?  Follow up   ? Gastroesophageal Reflux  ?  Protonix is not helping as much. Food feels like it gets stuck in chest.   ? Diabetes Eye Exam  ?  Patient will schedule appointment at [facility]   ? ? ?HPI ?Robin Murillo presents for  ? ?Diabetes - no hypoglycemic events. No wounds or sores that are not healing well. No increased thirst or urination. Checking glucose at home. Taking medications as prescribed without any side effects. ? ?F/U COPD - doing OK overall.  ? ?F/U GERD - Protonix is not helping as much. Food feels like it gets stuck in chest. She will drink sod to help her belch. She says she doesn't have any TUMS at home.   ? ?She says she has been under a lot of stress recently  ? ?Past Medical History:  ?Diagnosis Date  ? Heart disease   ? pacemaker/defiberlator  ? Thyroid disease   ? ? ?Past Surgical History:  ?Procedure Laterality Date  ? CARDIAC DEFIBRILLATOR PLACEMENT    ? 06/2010 at [facility]  ? CARDIAC ELECTROPHYSIOLOGY STUDY AND ABLATION    ? 06/2010  ? PACEMAKER PLACEMENT    ? [facility]  ? ? ?Family History  ?Problem Relation Age of Onset  ? Heart attack Brother   ? Heart disease Sister   ?     [place]  ? Thyroid disease Sister   ? Diabetes Brother   ? ? ? ? ?Outpatient Medications Prior to Visit  ?Medication Sig Dispense Refill  ? albuterol (VENTOLIN HFA) 108 (90 Base) MCG/ACT inhaler Inhale 2 puffs into the lungs every 6 (six) hours as needed for wheezing or shortness of breath. 18 g prn  ? ALPRAZolam (XANAX) 0.25 MG tablet Take 1 tablet (0.25 mg total) by mouth daily as needed for anxiety. 10 tablet 0  ? aspirin 81 MG tablet Take 81 mg by mouth daily.    ? atorvastatin (LIPITOR) 20 MG tablet TAKE 1 TABLET (20 MG) BY MOUTH NIGHTLY 90 tablet 3  ? Cholecalciferol (VITAMIN D PO) Take 2,000  Int'l Units by mouth daily.     ? FLUoxetine (PROZAC) 20 MG capsule TAKE 3 CAPSULES EVERY DAY 270 capsule 1  ? fluticasone furoate-vilanterol (BREO ELLIPTA) 100-25 MCG/ACT AEPB Inhale 1 puff into the lungs daily. 60 each 6  ? levothyroxine (SYNTHROID) 125 MCG tablet TAKE 1 TABLET EVERY DAY 90 tablet 1  ? lisinopril (ZESTRIL) 5 MG tablet TAKE 1 TABLET (5 MG TOTAL) BY MOUTH DAILY. 90 tablet 3  ? magnesium oxide (MAG-OX) 400 MG tablet Take 400 mg by mouth 2 (two) times daily.    ? metoprolol (LOPRESSOR) 50 MG tablet Take 50 mg by mouth 2 (two) times daily.    ? pantoprazole (PROTONIX) 40 MG tablet TAKE 1 TABLET EVERY DAY 90 tablet 1  ? vitamin C (ASCORBIC ACID) 500 MG tablet Take 500 mg by mouth daily.    ? zinc gluconate 50 MG tablet Take 50 mg by mouth daily.    ? tiZANidine (ZANAFLEX) 4 MG tablet Take 1 tablet (4 mg total) by mouth every 8 (eight) hours as needed for muscle spasms. (Patient not taking: Reported on 07/30/2021) 30 tablet 0  ? ?No facility-administered medications prior to visit.  ? ? ?  Allergies  ?Allergen Reactions  ? Morphine And Related Swelling  ?  Swelling in face  ? Oxycodone Other (See Comments)  ?  Felt really spacey  ? ? ?ROS ?Review of Systems ? ?  ?Objective:  ?  ?Physical Exam ?Constitutional:   ?   Appearance: Normal appearance. She is well-developed.  ?HENT:  ?   Head: Normocephalic and atraumatic.  ?Cardiovascular:  ?   Rate and Rhythm: Normal rate and regular rhythm.  ?   Heart sounds: Normal heart sounds.  ?Pulmonary:  ?   Effort: Pulmonary effort is normal.  ?   Breath sounds: Normal breath sounds.  ?Skin: ?   General: Skin is warm and dry.  ?Neurological:  ?   Mental Status: She is alert and oriented to person, place, and time.  ?Psychiatric:     ?   Behavior: Behavior normal.  ? ? ?BP 109/73   Pulse 71   Resp 18   Ht _0  (1.6 m)   Wt 245 lb (111.1 kg)   SpO2 96%   BMI 43.40 kg/m?  ?Wt Readings from Last 3 Encounters:  ?10/02/21 245 lb (111.1 kg)  ?06/04/21 238 lb (108 kg)   ?01/29/21 238 lb (108 kg)  ? ? ? ?Health Maintenance Due  ?Topic Date Due  ? OPHTHALMOLOGY EXAM  09/25/2021  ? ? ?There are no preventive care reminders to display for this patient. ? ?Lab Results  ?Component Value Date  ? TSH 1.53 06/04/2021  ? ?Lab Results  ?Component Value Date  ? WBC 7.6 09/21/2020  ? HGB 13.7 09/21/2020  ? HCT 43.1 09/21/2020  ? MCV 93.5 09/21/2020  ? PLT 305 09/21/2020  ? ?Lab Results  ?Component Value Date  ? NA 139 06/04/2021  ? K 4.4 06/04/2021  ? CO2 25 06/04/2021  ? GLUCOSE 130 (H) 06/04/2021  ? BUN 22 06/04/2021  ? CREATININE 1.08 (H) 06/04/2021  ? BILITOT 0.4 09/21/2020  ? ALKPHOS 155 (H) 07/18/2016  ? AST 27 09/21/2020  ? ALT 32 (H) 09/21/2020  ? PROT 6.8 09/21/2020  ? ALBUMIN 3.9 07/18/2016  ? CALCIUM 9.9 06/04/2021  ? EGFR 55 (L) 06/04/2021  ? ?Lab Results  ?Component Value Date  ? CHOL 126 09/21/2020  ? ?Lab Results  ?Component Value Date  ? HDL 57 09/21/2020  ? ?Lab Results  ?Component Value Date  ? LDLCALC 56 09/21/2020  ? ?Lab Results  ?Component Value Date  ? TRIG 54 09/21/2020  ? ?Lab Results  ?Component Value Date  ? CHOLHDL 2.2 09/21/2020  ? ?Lab Results  ?Component Value Date  ? HGBA1C 6.4 (A) 10/02/2021  ? ? ?  ?Assessment & Plan:  ? ?Problem List Items Addressed This Visit   ? ?  ? Respiratory  ? COPD (chronic obstructive pulmonary disease) ([place])  ?  Stable. ?  ?  ? Relevant Orders  ? Lipid Panel w/reflex Direct LDL  ? COMPLETE METABOLIC PANEL WITH GFR  ? CBC  ?  ? Digestive  ? GERD (gastroesophageal reflux disease)  ?  Ports having some breakthrough "indigestion" even though she is taking her pantoprazole once a day she is also feeling like food is getting hung briefly for sometimes in her upper chest.  Okay to increase pantoprazole to twice a day for 2 weeks and then go back down to once a day.  Also okay to use Tums intermittently.  Referral placed to GI. ?  ?  ? Relevant Orders  ? Lipid Panel w/reflex Direct  LDL  ? COMPLETE METABOLIC PANEL WITH GFR  ? CBC  ?  ?  Endocrine  ? Diabetes type 2, controlled ([place]) - Primary  ?  Weight and A1c went up from last fall.  Just encouraged her to continue work on [employer].  We discussed also restarting her metformin.  She says she still has some at home. ?  ?  ? Relevant Medications  ? metFORMIN (GLUCOPHAGE-XR) 500 MG 24 hr tablet  ? Other Relevant Orders  ? POCT glycosylated hemoglobin (Hb A1C) (Completed)  ? Lipid Panel w/reflex Direct LDL  ? COMPLETE METABOLIC PANEL WITH GFR  ? CBC  ? ?Other Visit Diagnoses   ? ? Dysphagia, unspecified type      ? Relevant Orders  ? Ambulatory referral to Gastroenterology  ? ?  ? ?Dysphagia-we will refer to GI.  For the short-term she can increase her pantoprazole to twice a day for 2 weeks and then go back down to once a day.  Which she can see if this helps.  Also recommending Tums as needed until she can get in with GI for further work-up. ? ?She also had an abnormal Cologuard last spring and says she never followed up with GI.  They did reach out to her but she said she got nervous about going and so never made the appointment. ? ?Meds ordered this encounter  ?Medications  ? metFORMIN (GLUCOPHAGE-XR) 500 MG 24 hr tablet  ?  Sig: TAKE 1 TABLET BY MOUTH EVERY DAY WITH BREAKFAST  ?  Dispense:  90 tablet  ?  Refill:  1  ? ? ?Follow-up: Return in about 4 months (around 02/01/2022) for Diabetes follow-up and thyroid check up .  ? ? ?Beatrice Lecher, MD ?

## 2021-10-03 ENCOUNTER — Other Ambulatory Visit: Payer: Self-pay | Admitting: *Deleted

## 2021-10-03 DIAGNOSIS — R7989 Other specified abnormal findings of blood chemistry: Secondary | ICD-10-CM

## 2021-10-03 LAB — COMPLETE METABOLIC PANEL WITH GFR
AG Ratio: 1.1 (calc) (ref 1.0–2.5)
ALT: 43 U/L — ABNORMAL HIGH (ref 6–29)
AST: 43 U/L — ABNORMAL HIGH (ref 10–35)
Albumin: 3.9 g/dL (ref 3.6–5.1)
Alkaline phosphatase (APISO): 284 U/L — ABNORMAL HIGH (ref 37–153)
BUN/Creatinine Ratio: 15 (calc) (ref 6–22)
BUN: 15 mg/dL (ref 7–25)
CO2: 24 mmol/L (ref 20–32)
Calcium: 9.6 mg/dL (ref 8.6–10.4)
Chloride: 105 mmol/L (ref 98–110)
Creat: 1.02 mg/dL — ABNORMAL HIGH (ref 0.60–1.00)
Globulin: 3.5 g/dL (calc) (ref 1.9–3.7)
Glucose, Bld: 132 mg/dL — ABNORMAL HIGH (ref 65–99)
Potassium: 4.6 mmol/L (ref 3.5–5.3)
Sodium: 138 mmol/L (ref 135–146)
Total Bilirubin: 0.6 mg/dL (ref 0.2–1.2)
Total Protein: 7.4 g/dL (ref 6.1–8.1)
eGFR: 59 mL/min/{1.73_m2} — ABNORMAL LOW (ref >=60)

## 2021-10-03 LAB — LIPID PANEL W/REFLEX DIRECT LDL
Cholesterol: 139 mg/dL (ref <200)
HDL: 56 mg/dL (ref >=50)
LDL Cholesterol (Calc): 68 mg/dL (calc)
Non-HDL Cholesterol (Calc): 83 mg/dL (calc) (ref <130)
Total CHOL/HDL Ratio: 2.5 (calc) (ref <5.0)
Triglycerides: 73 mg/dL (ref <150)

## 2021-10-03 LAB — CBC
HCT: 45.9 % — ABNORMAL HIGH (ref 35.0–45.0)
Hemoglobin: 14.6 g/dL (ref 11.7–15.5)
MCH: 30.1 pg (ref 27.0–33.0)
MCHC: 31.8 g/dL — ABNORMAL LOW (ref 32.0–36.0)
MCV: 94.6 fL (ref 80.0–100.0)
MPV: 11.2 fL (ref 7.5–12.5)
Platelets: 292 10*3/uL (ref 140–400)
RBC: 4.85 10*6/uL (ref 3.80–5.10)
RDW: 12.8 % (ref 11.0–15.0)
WBC: 9.3 10*3/uL (ref 3.8–10.8)

## 2021-10-03 NOTE — Progress Notes (Signed)
Hi Robin Murillo, kidney function is stable at 1.0.  Your liver enzymes are up again.  They are in the 40s.  Really when she to cut back on any Tylenol that she might be taking and make sure you are eating a low fat diet minimizing fried foods and carbs and then I want to recheck your liver function again in 2 weeks.  Your cholesterol looks good.

## 2021-10-18 DIAGNOSIS — R7989 Other specified abnormal findings of blood chemistry: Secondary | ICD-10-CM | POA: Diagnosis not present

## 2021-10-19 LAB — HEPATIC FUNCTION PANEL
AG Ratio: 1.1 (calc) (ref 1.0–2.5)
ALT: 41 U/L — ABNORMAL HIGH (ref 6–29)
AST: 40 U/L — ABNORMAL HIGH (ref 10–35)
Albumin: 3.9 g/dL (ref 3.6–5.1)
Alkaline phosphatase (APISO): 260 U/L — ABNORMAL HIGH (ref 37–153)
Bilirubin, Direct: 0.1 mg/dL (ref 0.0–0.2)
Globulin: 3.4 g/dL (calc) (ref 1.9–3.7)
Indirect Bilirubin: 0.4 mg/dL (calc) (ref 0.2–1.2)
Total Bilirubin: 0.5 mg/dL (ref 0.2–1.2)
Total Protein: 7.3 g/dL (ref 6.1–8.1)

## 2021-10-21 ENCOUNTER — Other Ambulatory Visit: Payer: Self-pay | Admitting: Family Medicine

## 2021-10-21 DIAGNOSIS — R7989 Other specified abnormal findings of blood chemistry: Secondary | ICD-10-CM

## 2021-10-21 NOTE — Progress Notes (Signed)
Orders Placed This Encounter  ?Procedures  ? US Abdomen Complete  ?  Standing Status:   Future  ?  Standing Expiration Date:   10/22/2022  ?  Order Specific Question:   Reason for Exam (SYMPTOM  OR DIAGNOSIS REQUIRED)  ?  Answer:   elevated liver enzymes  ?  Order Specific Question:   Preferred imaging location?  ?  Answer:   [facility]  ? ? ?

## 2021-10-21 NOTE — Progress Notes (Signed)
HI Robin Murillo, your liver enzymes are still a little elevated . They didn't go down but they didn't go up either. I would like to get an [place] of your liver to rule out other causes.  We will place the order for that.

## 2021-10-22 ENCOUNTER — Other Ambulatory Visit: Payer: Self-pay | Admitting: Family Medicine

## 2021-10-22 DIAGNOSIS — R7989 Other specified abnormal findings of blood chemistry: Secondary | ICD-10-CM

## 2021-10-30 ENCOUNTER — Other Ambulatory Visit: Payer: Self-pay

## 2021-10-30 NOTE — Progress Notes (Signed)
HI Robin Murillo, your liver enzymes are still a little elevated . They didn't go down but they didn't go up either. I would like to get an [place] of your liver to rule out other causes.  We will place the order for that. Forward to Dr. Linford Arnold to place order.  ?

## 2021-11-06 ENCOUNTER — Ambulatory Visit (HOSPITAL_COMMUNITY)
Admission: RE | Admit: 2021-11-06 | Discharge: 2021-11-06 | Disposition: A | Discharge status: Home / Self Care | Payer: Medicare HMO | Source: Ambulatory Visit | Attending: Family Medicine | Admitting: Family Medicine

## 2021-11-06 DIAGNOSIS — K760 Fatty (change of) liver, not elsewhere classified: Secondary | ICD-10-CM | POA: Diagnosis not present

## 2021-11-06 DIAGNOSIS — R7989 Other specified abnormal findings of blood chemistry: Secondary | ICD-10-CM | POA: Diagnosis not present

## 2021-11-07 ENCOUNTER — Encounter: Payer: Self-pay | Admitting: Family Medicine

## 2021-11-07 DIAGNOSIS — K760 Fatty (change of) liver, not elsewhere classified: Secondary | ICD-10-CM | POA: Insufficient documentation

## 2021-11-07 NOTE — Progress Notes (Signed)
Hi [place], ultrasound shows fatty liver also known as hepatic steatosis.  The most common reason that we see elevation in liver enzymes.  The treatment for fatty liver is working on healthy diet increasing vegetables and lean proteins and decreasing sweets, carbohydrates.  So reducing things like pasta, crackers, breads and sweets in the diet.  Weight loss is also helpful to reduce the fat content of the liver.  Fatty liver does put you at increased risk of developing cirrhosis over time.  Lets plan to recheck your liver function again in about 3 months.

## 2021-11-13 ENCOUNTER — Telehealth: Payer: Self-pay | Admitting: Family Medicine

## 2021-11-13 NOTE — Telephone Encounter (Signed)
Call pt: received note from [facility] that she declined the referral but I really like for her to at least do the consultation with the GI.  I understand she does not want to have a colonoscopy but I do want her to at least meet with them. ?

## 2021-11-15 NOTE — Telephone Encounter (Signed)
Patient advised of message and verbalized understanding. Patient was given phone number to schedule appointment with [facility]. Patient agreed.  ?

## 2021-12-02 ENCOUNTER — Other Ambulatory Visit: Payer: Self-pay | Admitting: Family Medicine

## 2021-12-24 ENCOUNTER — Ambulatory Visit (INDEPENDENT_AMBULATORY_CARE_PROVIDER_SITE_OTHER): Payer: Medicare HMO | Admitting: Pharmacist

## 2021-12-24 DIAGNOSIS — J449 Chronic obstructive pulmonary disease, unspecified: Secondary | ICD-10-CM

## 2021-12-24 DIAGNOSIS — E119 Type 2 diabetes mellitus without complications: Secondary | ICD-10-CM

## 2021-12-24 NOTE — Patient Instructions (Signed)
Visit Information  Thank you for taking time to visit with me today. Please don't hesitate to contact me if I can be of assistance to you before our next scheduled telephone appointment.  Following are the goals we discussed today:  Patient Goals/Self-Care Activities Over the next 180 days, patient will:  take medications as prescribed  Follow Up Plan: Telephone follow up appointment with care management team member scheduled for:  6-8 months   Please call the care guide team at [phone] if you need to cancel or reschedule your appointment.   If you are experiencing a Mental Health or Behavioral Health Crisis or need someone to talk to, please call 1-800-273-TALK (toll free, 24 hour hotline)   Patient verbalizes understanding of instructions and care plan provided today and agrees to view in MyChart. Active MyChart status and patient understanding of how to access instructions and care plan via MyChart confirmed with patient.     Robin Murillo

## 2021-12-24 NOTE — Progress Notes (Signed)
Chronic Care Management Pharmacy Note  12/24/2021 Name:  Robin Murillo MRN:  [id number] DOB:  13-Apr-1951  Summary: addressed DM, COPD.  Not currently taking metformin, due well controlled DM, under advice of PCP.  Recommendations/Changes made from today's visit: None, however encouraged patient to follow-up with gastroenterology for ongoing workup with liver enzyme elevations, which she verbalized understanding.  Plan: f/u with pharmacist in 6-8 months  Subjective: Robin Murillo is an 71 y.o. year old female who is a primary patient of Metheney, Barbarann Ehlers, MD.  The CCM team was consulted for assistance with disease management and care coordination needs.    Engaged with patient by telephone for follow up visit in response to provider referral for pharmacy case management and/or care coordination services.   Consent to Services:  The patient was given information about Chronic Care Management services, agreed to services, and gave verbal consent prior to initiation of services.  Please see initial visit note for detailed documentation.   Patient Care Team: Agapito Games, MD as PCP - General (Family Medicine) Randolm Idol, MD as Referring Physician (Pulmonary Disease) Louie Bun, MD as Referring Physician (Cardiology) Gabriel Carina, [facility] as Pharmacist (Pharmacist) Raynald Blend, OD as Referring Physician (Optometry)  Recent office visits:  01/29/21 Agapito Games MD - Seen for Diabetes - Labs ordered - No medication changes noted - Follow up in 4 months  10/19/20 Clayborne Dana NP - Video visit seen for Viral upper respiratory tract infection - No medication changes noted - No follow up noted   Recent consult visits:  02/13/21 Louie Bun - Cardiology - Seen for Ventricular tachycardia - No notes available  02/08/21 Chi Hoy Register PA - Gastroenterologist - Seen for Gastro-esophageal reflux disease - No notes available  11/16/20 Bertram Savin NP -  Cardiology - Seen for atrial flutter - No medication changes noted - Follow up in 6 months 11/16/20 Daymon Larsen - Cardiology - Seen for atrial flutter - No notes available  11/06/20 Louie Bun - Cardiology - Seen for Ventricular tachycardia - No notes available    Hospital visits:  None in previous 6 months  Objective:  Lab Results  Component Value Date   CREATININE 1.02 (H) 10/02/2021   CREATININE 1.08 (H) 06/04/2021   CREATININE 0.84 09/21/2020    Lab Results  Component Value Date   HGBA1C 6.4 (A) 10/02/2021   Last diabetic Eye exam:  Lab Results  Component Value Date/Time   HMDIABEYEEXA No Retinopathy 09/25/2020 12:00 AM        Component Value Date/Time   CHOL 139 10/02/2021 0000   TRIG 73 10/02/2021 0000   HDL 56 10/02/2021 0000   CHOLHDL 2.5 10/02/2021 0000   VLDL 19 07/18/2016 0956   LDLCALC 68 10/02/2021 0000       Latest Ref Rng & Units 10/18/2021    8:12 AM 10/02/2021   12:00 AM 09/21/2020   12:00 AM  Hepatic Function  Total Protein 6.1 - 8.1 g/dL 7.3  7.4  6.8   AST 10 - 35 U/L 40  43  27   ALT 6 - 29 U/L 41  43  32   Total Bilirubin 0.2 - 1.2 mg/dL 0.5  0.6  0.4   Bilirubin, Direct 0.0 - 0.2 mg/dL 0.1       Lab Results  Component Value Date/Time   TSH 1.53 06/04/2021 12:00 AM   TSH 0.57 09/21/2020 12:00 AM  Latest Ref Rng & Units 10/02/2021   12:00 AM 09/21/2020   12:00 AM 04/21/2018    7:45 AM  CBC  WBC 3.8 - 10.8 Thousand/uL 9.3  7.6  7.8   Hemoglobin 11.7 - 15.5 g/dL 68.3  41.9  62.2   Hematocrit 35.0 - 45.0 % 45.9  43.1  43.8   Platelets 140 - 400 Thousand/uL 292  305  313     Lab Results  Component Value Date/Time   VD25OH 34 09/21/2020 12:00 AM   VD25OH 33 04/21/2018 07:45 AM    Social History   Tobacco Use  Smoking Status Former   Types: Cigarettes   Quit date: 04/04/2003   Years since quitting: 18.7  Smokeless Tobacco Never   BP Readings from Last 3 Encounters:  10/02/21 109/73  06/04/21 (!) 130/52   01/29/21 (!) 121/49   Pulse Readings from Last 3 Encounters:  10/02/21 71  06/04/21 86  01/29/21 82   Wt Readings from Last 3 Encounters:  10/02/21 245 lb (111.1 kg)  06/04/21 238 lb (108 kg)  01/29/21 238 lb (108 kg)    Assessment: Review of patient past medical history, allergies, medications, health status, including review of consultants reports, laboratory and other test data, was performed as part of comprehensive evaluation and provision of chronic care management services.   SDOH:  (Social Determinants of Health) assessments and interventions performed:    CCM Care Plan  Allergies  Allergen Reactions   Morphine And Related Swelling    Swelling in face   Oxycodone Other (See Comments)    Felt really spacey    Medications Reviewed Today     Reviewed by Osborne Oman, CMA (Certified Medical Assistant) on 10/02/21 at [phone]  Med List Status: <None>   Medication Order Taking? Sig Documenting Provider Last Dose Status Informant  albuterol (VENTOLIN HFA) 108 (90 Base) MCG/ACT inhaler [id number] Yes Inhale 2 puffs into the lungs every 6 (six) hours as needed for wheezing or shortness of breath. Clayborne Dana, NP Taking Active   ALPRAZolam Prudy Feeler) 0.25 MG tablet [id number] Yes Take 1 tablet (0.25 mg total) by mouth daily as needed for anxiety. Agapito Games, MD Taking Active   aspirin 81 MG tablet [id number] Yes Take 81 mg by mouth daily. [provider] Taking Active   atorvastatin (LIPITOR) 20 MG tablet [id number] Yes TAKE 1 TABLET (20 MG) BY MOUTH NIGHTLY Agapito Games, MD Taking Active   Cholecalciferol (VITAMIN D PO) [id number] Yes Take 2,000 Int'l Units by mouth daily.  [provider] Taking Active   FLUoxetine (PROZAC) 20 MG capsule [id number] Yes TAKE 3 CAPSULES EVERY DAY Agapito Games, MD Taking Active   fluticasone furoate-vilanterol (BREO ELLIPTA) 100-25 MCG/ACT AEPB [id number] Yes Inhale 1 puff into the lungs daily. Agapito Games, MD Taking Active   levothyroxine (SYNTHROID) 125 MCG tablet [id number] Yes TAKE 1 TABLET EVERY DAY Agapito Games, MD Taking Active   lisinopril (ZESTRIL) 5 MG tablet [id number] Yes TAKE 1 TABLET (5 MG TOTAL) BY MOUTH DAILY. Agapito Games, MD Taking Active   magnesium oxide (MAG-OX) 400 MG tablet [id number] Yes Take 400 mg by mouth 2 (two) times daily. [provider] Taking Active   metoprolol (LOPRESSOR) 50 MG tablet [id number] Yes Take 50 mg by mouth 2 (two) times daily. [provider] Taking Active   pantoprazole (PROTONIX) 40 MG tablet [id number] Yes TAKE 1 TABLET EVERY DAY Agapito Games, MD Taking Active   vitamin C (  ASCORBIC ACID) 500 MG tablet [id number] Yes Take 500 mg by mouth daily. [provider] Taking Active Self  zinc gluconate 50 MG tablet [id number] Yes Take 50 mg by mouth daily. 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   Cardiac defibrillator in place 01/17/2015   GERD (gastroesophageal reflux disease) 10/12/2014   AR (allergic rhinitis) 09/21/2013   COPD (chronic obstructive pulmonary disease) (HCC) 04/21/2013   Obstructive sleep apnea 02/26/2013   History of ventricular fibrillation 02/22/2013   Pacemaker 12/24/2011   Obesity, Class III, BMI 40-49.9 (morbid obesity) (HCC) 12/04/2011   Diabetes type 2, controlled (HCC) 12/04/2011   Hypothyroid 12/02/2011   Depression with anxiety 12/02/2011   Vitamin D deficiency 06/06/2010   Hereditary and idiopathic peripheral neuropathy 06/05/2010    Immunization History  Administered Date(s) Administered   Fluad Quad(high Dose  65+) 05/04/2019, 05/29/2020, 06/04/2021   Influenza, High Dose Seasonal PF 04/24/2017, 04/02/2018   Influenza,inj,Quad PF,6+ Mos 04/27/2014, 05/05/2015, 04/19/2016   Influenza-Unspecified 04/08/2013, 05/04/2019, 05/29/2020   PFIZER(Purple Top)SARS-COV-2 Vaccination 08/10/2019, 08/27/2019   Pneumococcal Conjugate-13 01/15/2016   Pneumococcal Polysaccharide-23 04/21/2013, 09/21/2020   Td 05/10/2008   Tdap 05/10/2008   Zoster Recombinat (Shingrix) 10/12/2020, 12/29/2020   Zoster, Live 10/01/2012    Conditions to be addressed/monitored: COPD and DMII  There are no care plans that you recently modified to display for this patient.    Medication Assistance: None required.  Patient affirms current coverage meets needs.  Patient's preferred pharmacy is:  [facility] - [place], [place] - [address] [place] [id number] Phone: [phone] Fax: [phone]  [facility] [place], [place] - [address] [place] [address] Phone: [phone] Fax: [phone]  [id number] - [place], [place] - [address] AT [facility] [address] [place] [address] Phone: [phone] Fax: [phone]  Uses pill box? No - has bottles separate for AM and PM and a routine Pt endorses 100% compliance  Follow Up:  Patient agrees to Care Plan and Follow-up.  Plan: Telephone follow up appointment with care management team member scheduled for:  6-8 months  Lynnda ShieldsKeesha Markeita Alicia, PharmD Clinical Pharmacist [facility] [place] [phone]

## 2022-01-06 ENCOUNTER — Other Ambulatory Visit: Payer: Self-pay | Admitting: Family Medicine

## 2022-01-06 DIAGNOSIS — J069 Acute upper respiratory infection, unspecified: Secondary | ICD-10-CM

## 2022-01-11 DIAGNOSIS — J449 Chronic obstructive pulmonary disease, unspecified: Secondary | ICD-10-CM

## 2022-01-11 DIAGNOSIS — E119 Type 2 diabetes mellitus without complications: Secondary | ICD-10-CM

## 2022-01-21 ENCOUNTER — Ambulatory Visit (INDEPENDENT_AMBULATORY_CARE_PROVIDER_SITE_OTHER): Payer: Medicare HMO | Admitting: Family Medicine

## 2022-01-21 ENCOUNTER — Encounter: Payer: Self-pay | Admitting: Family Medicine

## 2022-01-21 VITALS — BP 126/68 | HR 77 | Ht 63.0 in | Wt 250.0 lb

## 2022-01-21 DIAGNOSIS — E119 Type 2 diabetes mellitus without complications: Secondary | ICD-10-CM

## 2022-01-21 DIAGNOSIS — F418 Other specified anxiety disorders: Secondary | ICD-10-CM

## 2022-01-21 DIAGNOSIS — R7989 Other specified abnormal findings of blood chemistry: Secondary | ICD-10-CM | POA: Diagnosis not present

## 2022-01-21 LAB — POCT GLYCOSYLATED HEMOGLOBIN (HGB A1C): Hemoglobin A1C: 6.8 % — AB (ref 4.0–5.6)

## 2022-01-21 MED ORDER — ALPRAZOLAM 0.25 MG PO TABS: 0.2500 mg | ORAL_TABLET | Freq: Every day | ORAL | 0 refills | Status: DC | PRN

## 2022-01-21 NOTE — Assessment & Plan Note (Signed)
Still battling some depressive and anxiety symptoms.  She says the biggest factor is that a friend of hers just recently passed away from cancer and it just really hit her hard.  No thoughts of wanting to harm herself.  But she would like a refill on her alprazolam we last filled it for 10 tabs about a year ago so she says what she has left over is expired.  New prescription sent to pharmacy.

## 2022-01-21 NOTE — Assessment & Plan Note (Addendum)
A1C did go up to 6.8.  She admits she has not been doing the best with her diet but really wants to get back on track.  She wants to see if she is able to get this under under control over the next 3 to 4 months.  We also discussed the possibility of adding a GLP-1 as she is also frustrated with her weight gain.

## 2022-01-21 NOTE — Progress Notes (Signed)
Established Patient Office Visit  Subjective   Patient ID: Robin Murillo, female    DOB: [birth year]  Age: 71 y.o. MRN: [id number]  Chief Complaint  Patient presents with   Diabetes    HPI  Diabetes - no hypoglycemic events. No wounds or sores that are not healing well. No increased thirst or urination. Checking glucose at home. Taking medications as prescribed without any side effects.  Elevated enzymes of the liver-she did have elevated enzymes in March we rechecked them again in April.  They were stable also not trending upward but still elevated. Had [place] elastography in April.  Insistent with fatty liver.  She does get short of breath with activity and has to sit and rest in between she just feels like she has a lot to get done at home.  She has been trying to eat a little bit more slowly.    ROS    Objective:     BP 126/68   Pulse 77   Ht 5\' 3"  (1.6 m)   Wt 250 lb (113.4 kg)   SpO2 94%   BMI 44.29 kg/m    Physical Exam Vitals and nursing note reviewed.  Constitutional:      Appearance: She is well-developed.  HENT:     Head: Normocephalic and atraumatic.  Cardiovascular:     Rate and Rhythm: Normal rate and regular rhythm.     Heart sounds: Normal heart sounds.  Pulmonary:     Effort: Pulmonary effort is normal.     Breath sounds: Normal breath sounds.  Skin:    General: Skin is warm and dry.  Neurological:     Mental Status: She is alert and oriented to person, place, and time.  Psychiatric:        Behavior: Behavior normal.    Results for orders placed or performed in visit on 01/21/22  POCT glycosylated hemoglobin (Hb A1C)  Result Value Ref Range   Hemoglobin A1C 6.8 (A) 4.0 - 5.6 %   HbA1c POC (<> result, manual entry)     HbA1c, POC (prediabetic range)     HbA1c, POC (controlled diabetic range)        The 10-year ASCVD risk score (Arnett DK, et al., 2019) is: 22.6%    Assessment & Plan:   Problem List Items Addressed This Visit        Endocrine   Diabetes type 2, controlled (HCC) - Primary    A1C did go up to 6.8.  She admits she has not been doing the best with her diet but really wants to get back on track.  She wants to see if she is able to get this under under control over the next 3 to 4 months.  We also discussed the possibility of adding a GLP-1 as she is also frustrated with her weight gain.      Relevant Orders   POCT glycosylated hemoglobin (Hb A1C) (Completed)     Other   Depression with anxiety    Still battling some depressive and anxiety symptoms.  She says the biggest factor is that a friend of hers just recently passed away from cancer and it just really hit her hard.  No thoughts of wanting to harm herself.  But she would like a refill on her alprazolam we last filled it for 10 tabs about a year ago so she says what she has left over is expired.  New prescription sent to pharmacy.      Relevant Medications  ALPRAZolam (XANAX) 0.25 MG tablet   Other Visit Diagnoses     Elevated liver function tests       Relevant Orders   Hepatic function panel       Return in about 3 months (around 04/23/2022) for Diabetes follow-up.    Nani Gasser, MD

## 2022-01-22 DIAGNOSIS — I4892 Unspecified atrial flutter: Secondary | ICD-10-CM | POA: Diagnosis not present

## 2022-01-22 DIAGNOSIS — I472 Ventricular tachycardia, unspecified: Secondary | ICD-10-CM | POA: Diagnosis not present

## 2022-01-22 DIAGNOSIS — I10 Essential (primary) hypertension: Secondary | ICD-10-CM | POA: Diagnosis not present

## 2022-01-22 DIAGNOSIS — I4581 Long QT syndrome: Secondary | ICD-10-CM | POA: Diagnosis not present

## 2022-01-22 DIAGNOSIS — I4729 Other ventricular tachycardia: Secondary | ICD-10-CM | POA: Diagnosis not present

## 2022-01-22 LAB — HEPATIC FUNCTION PANEL
AG Ratio: 1.2 (calc) (ref 1.0–2.5)
ALT: 49 U/L — ABNORMAL HIGH (ref 6–29)
AST: 56 U/L — ABNORMAL HIGH (ref 10–35)
Albumin: 4 g/dL (ref 3.6–5.1)
Alkaline phosphatase (APISO): 314 U/L — ABNORMAL HIGH (ref 37–153)
Bilirubin, Direct: 0.1 mg/dL (ref 0.0–0.2)
Globulin: 3.4 g/dL (calc) (ref 1.9–3.7)
Indirect Bilirubin: 0.4 mg/dL (calc) (ref 0.2–1.2)
Total Bilirubin: 0.5 mg/dL (ref 0.2–1.2)
Total Protein: 7.4 g/dL (ref 6.1–8.1)

## 2022-01-22 NOTE — Progress Notes (Signed)
Hi Robin Murillo, your liver enzymes have gone up just slightly compared to previous.  Really just want to work on cutting back on processed foods and carbohydrates like bread rice and pasta work on increasing vegetables and lean proteins.  I would like to repeat your labs in about 1 month.  I went ahead and put the order in.  You can go at your convenience sometime in mid August.

## 2022-01-22 NOTE — Addendum Note (Signed)
Addended by: Nani Gasser D on: 01/22/2022 07:34 AM   Modules accepted: Orders

## 2022-01-28 ENCOUNTER — Ambulatory Visit (INDEPENDENT_AMBULATORY_CARE_PROVIDER_SITE_OTHER): Payer: Medicare HMO | Admitting: Family Medicine

## 2022-01-28 DIAGNOSIS — Z0000 Encounter for general adult medical examination without abnormal findings: Secondary | ICD-10-CM

## 2022-01-28 NOTE — Patient Instructions (Addendum)
MEDICARE ANNUAL WELLNESS VISIT Health Maintenance Summary and Written Plan of Care  Ms. Robin Murillo ,  Thank you for allowing me to perform your Medicare Annual Wellness Visit and for your ongoing commitment to your health.   Health Maintenance & Immunization History Health Maintenance  Topic Date Due  . OPHTHALMOLOGY EXAM  01/28/2022 (Originally 09/25/2021)  . Diabetic kidney evaluation - Urine ACR  01/29/2022 (Originally 09/21/2021)  . COVID-19 Vaccine (3 - Pfizer risk series) 02/13/2022 (Originally 09/24/2019)  . TETANUS/TDAP  04/20/2022 (Originally 05/10/2018)  . INFLUENZA VACCINE  02/12/2022  . HEMOGLOBIN A1C  07/24/2022  . Diabetic kidney evaluation - GFR measurement  10/03/2022  . FOOT EXAM  10/03/2022  . MAMMOGRAM  04/19/2023  . DEXA SCAN  04/19/2023  . Fecal DNA (Cologuard)  09/21/2023  . Pneumonia Vaccine 55+ Years old  Completed  . Hepatitis C Screening  Completed  . Zoster Vaccines- Shingrix  Completed  . HPV VACCINES  Aged Out   Immunization History  Administered Date(s) Administered  . Fluad Quad(high Dose 65+) 05/04/2019, 05/29/2020, 06/04/2021  . Influenza, High Dose Seasonal PF 04/24/2017, 04/02/2018  . Influenza,inj,Quad PF,6+ Mos 04/27/2014, 05/05/2015, 04/19/2016  . Influenza-Unspecified 04/08/2013, 05/04/2019, 05/29/2020  . PFIZER(Purple Top)SARS-COV-2 Vaccination 08/10/2019, 08/27/2019  . Pneumococcal Conjugate-13 01/15/2016  . Pneumococcal Polysaccharide-23 04/21/2013, 09/21/2020  . Td 05/10/2008  . Tdap 05/10/2008  . Zoster Recombinat (Shingrix) 10/12/2020, 12/29/2020  . Zoster, Live 10/01/2012    These are the patient goals that we discussed:  Goals Addressed              This Visit's Progress   .  Patient Stated (pt-stated)        Would like to loose 100 lbs.        This is a list of Health Maintenance Items that are overdue or due now: Td vaccine Urine ACR- Ordered January 22, 2022. Eye exam  Orders/Referrals Placed Today: No orders of  the defined types were placed in this encounter.  (Contact our referral department at [phone] if you have not spoken with someone about your referral appointment within the next 5 days)    Follow-up Plan Follow-up with Agapito Games, MD as planned Schedule your td vaccine at your pharmacy. Schedule your eye exam. Medicare wellness visit in one year.  Patient will access AVS on my chart.      Health Maintenance, Female Adopting a healthy lifestyle and getting preventive care are important in promoting health and wellness. Ask your health care provider about: The right schedule for you to have regular tests and exams. Things you can do on your own to prevent diseases and keep yourself healthy. What should I know about diet, weight, and exercise? Eat a healthy diet  Eat a diet that includes plenty of vegetables, fruits, low-fat dairy products, and lean protein. Do not eat a lot of foods that are high in solid fats, added sugars, or sodium. Maintain a healthy weight Body mass index (BMI) is used to identify weight problems. It estimates body fat based on height and weight. Your health care provider can help determine your BMI and help you achieve or maintain a healthy weight. Get regular exercise Get regular exercise. This is one of the most important things you can do for your health. Most adults should: Exercise for at least 150 minutes each week. The exercise should increase your heart rate and make you sweat (moderate-intensity exercise). Do strengthening exercises at least twice a week. This is in addition to the  moderate-intensity exercise. Spend less time sitting. Even light physical activity can be beneficial. Watch cholesterol and blood lipids Have your blood tested for lipids and cholesterol at 71 years of age, then have this test every 5 years. Have your cholesterol levels checked more often if: Your lipid or cholesterol levels are high. You are older than 71  years of age. You are at high risk for heart disease. What should I know about cancer screening? Depending on your health history and family history, you may need to have cancer screening at various ages. This may include screening for: Breast cancer. Cervical cancer. Colorectal cancer. Skin cancer. Lung cancer. What should I know about heart disease, diabetes, and high blood pressure? Blood pressure and heart disease High blood pressure causes heart disease and increases the risk of stroke. This is more likely to develop in people who have high blood pressure readings or are overweight. Have your blood pressure checked: Every 3-5 years if you are 43-5 years of age. Every year if you are 23 years old or older. Diabetes Have regular diabetes screenings. This checks your fasting blood sugar level. Have the screening done: Once every three years after age 60 if you are at a normal weight and have a low risk for diabetes. More often and at a younger age if you are overweight or have a high risk for diabetes. What should I know about preventing infection? Hepatitis B If you have a higher risk for hepatitis B, you should be screened for this virus. Talk with your health care provider to find out if you are at risk for hepatitis B infection. Hepatitis C Testing is recommended for: Everyone born from [birth year] through 1965. Anyone with known risk factors for hepatitis C. Sexually transmitted infections (STIs) Get screened for STIs, including gonorrhea and chlamydia, if: You are sexually active and are younger than 71 years of age. You are older than 71 years of age and your health care provider tells you that you are at risk for this type of infection. Your sexual activity has changed since you were last screened, and you are at increased risk for chlamydia or gonorrhea. Ask your health care provider if you are at risk. Ask your health care provider about whether you are at high risk for HIV. Your  health care provider may recommend a prescription medicine to help prevent HIV infection. If you choose to take medicine to prevent HIV, you should first get tested for HIV. You should then be tested every 3 months for as long as you are taking the medicine. Pregnancy If you are about to stop having your period (premenopausal) and you may become pregnant, seek counseling before you get pregnant. Take 400 to 800 micrograms (mcg) of folic acid every day if you become pregnant. Ask for birth control (contraception) if you want to prevent pregnancy. Osteoporosis and menopause Osteoporosis is a disease in which the bones lose minerals and strength with aging. This can result in bone fractures. If you are 38 years old or older, or if you are at risk for osteoporosis and fractures, ask your health care provider if you should: Be screened for bone loss. Take a calcium or vitamin D supplement to lower your risk of fractures. Be given hormone replacement therapy (HRT) to treat symptoms of menopause. Follow these instructions at home: Alcohol use Do not drink alcohol if: Your health care provider tells you not to drink. You are pregnant, may be pregnant, or are planning to become pregnant.  If you drink alcohol: Limit how much you have to: 0-1 drink a day. Know how much alcohol is in your drink. In the [place] one drink equals one 12 oz bottle of beer (355 mL), one 5 oz glass of wine (148 mL), or one 1 oz glass of hard liquor (44 mL). Lifestyle Do not use any products that contain nicotine or tobacco. These products include cigarettes, chewing tobacco, and vaping devices, such as e-cigarettes. If you need help quitting, ask your health care provider. Do not use street drugs. Do not share needles. Ask your health care provider for help if you need support or information about quitting drugs. General instructions Schedule regular health, dental, and eye exams. Stay current with your vaccines. Tell your  health care provider if: You often feel depressed. You have ever been abused or do not feel safe at home. Summary Adopting a healthy lifestyle and getting preventive care are important in promoting health and wellness. Follow your health care provider's instructions about healthy diet, exercising, and getting tested or screened for diseases. Follow your health care provider's instructions on monitoring your cholesterol and blood pressure. This information is not intended to replace advice given to you by your health care provider. Make sure you discuss any questions you have with your health care provider. Document Revised: 11/20/2020 Document Reviewed: 11/20/2020 Elsevier Patient Education  2023 [employer].

## 2022-01-28 NOTE — Progress Notes (Signed)
MEDICARE ANNUAL WELLNESS VISIT  01/28/2022  Telephone Visit Disclaimer This Medicare AWV was conducted by telephone due to national recommendations for restrictions regarding the COVID-19 Pandemic (e.g. social distancing).  I verified, using two identifiers, that I am speaking with Robin Murillo or their authorized healthcare agent. I discussed the limitations, risks, security, and privacy concerns of performing an evaluation and management service by telephone and the potential availability of an in-person appointment in the future. The patient expressed understanding and agreed to proceed.  Location of Patient: Home Location of Provider (nurse):  In the office.  Subjective:    Robin Murillo is a 71 y.o. female patient of Metheney, Rene Kocher, MD who had a Medicare Annual Wellness Visit today via telephone. Robin Murillo is Retired and Disabled and lives with their spouse, son and 2 granddaughters. she has 3 children. she reports that she is socially active and does interact with friends/family regularly. she is minimally physically active and enjoys playing word search and bingo on her tablet.  Patient Care Team: Hali Marry, MD as PCP - General (Family Medicine) Dionne Milo, MD as Referring Physician (Pulmonary Disease) Bonney Aid, MD as Referring Physician (Cardiology) Darius Bump, [facility] as Pharmacist (Pharmacist) Haynes Dage, OD as Referring Physician (Optometry)     01/28/2022    9:59 AM 09/08/2020    8:51 AM 05/12/2019    9:05 AM 10/12/2014    9:14 AM  Advanced Directives  Does Patient Have a Medical Advance Directive? No No No No  Would patient like information on creating a medical advance directive? No - Patient declined No - Patient declined No - Patient declined Yes - Educational materials given    Hospital Utilization Over the Past 12 Months: # of hospitalizations or ER visits: 0 # of surgeries: 0  Review of Systems    Patient reports that  her overall health is better compared to last year.  History obtained from chart review and the patient  Patient Reported Readings (BP, Pulse, CBG, Weight, etc) none  Pain Assessment Pain : No/denies pain     Current Medications & Allergies (verified) Allergies as of 01/28/2022       Reactions   Morphine And Related Swelling   Swelling in face   Oxycodone Other (See Comments)   Felt really spacey        Medication List        Accurate as of January 28, 2022 10:14 AM. If you have any questions, ask your nurse or doctor.          acetaminophen 325 MG tablet Commonly known as: TYLENOL Take by mouth.   albuterol 108 (90 Base) MCG/ACT inhaler Commonly known as: VENTOLIN HFA INHALE 2 PUFFS INTO THE LUNGS EVERY 6 HOURS AS NEEDED FOR WHEEZING OR SHORTNESS OF BREATH   ALPRAZolam 0.25 MG tablet Commonly known as: XANAX Take 1 tablet (0.25 mg total) by mouth daily as needed for anxiety.   aspirin 81 MG tablet Take 81 mg by mouth daily.   atorvastatin 20 MG tablet Commonly known as: LIPITOR TAKE 1 TABLET (20 MG) BY MOUTH NIGHTLY   FLUoxetine 20 MG capsule Commonly known as: PROZAC TAKE 3 CAPSULES EVERY DAY   fluticasone furoate-vilanterol 100-25 MCG/ACT Aepb Commonly known as: Breo Ellipta Inhale 1 puff into the lungs daily.   levothyroxine 125 MCG tablet Commonly known as: SYNTHROID TAKE 1 TABLET EVERY DAY   lisinopril 5 MG tablet Commonly known as: ZESTRIL TAKE 1 TABLET (5 MG  TOTAL) BY MOUTH DAILY.   magnesium oxide 400 MG tablet Commonly known as: MAG-OX Take 400 mg by mouth 2 (two) times daily.   metoprolol tartrate 50 MG tablet Commonly known as: LOPRESSOR Take 50 mg by mouth 2 (two) times daily.   pantoprazole 40 MG tablet Commonly known as: PROTONIX TAKE 1 TABLET EVERY DAY   vitamin C 500 MG tablet Commonly known as: ASCORBIC ACID Take 500 mg by mouth daily.   VITAMIN D PO Take 2,000 Int'l Units by mouth daily.   zinc gluconate 50 MG  tablet Take 50 mg by mouth daily.        History (reviewed): Past Medical History:  Diagnosis Date   Heart disease    pacemaker/defiberlator   Thyroid disease    Past Surgical History:  Procedure Laterality Date   CARDIAC DEFIBRILLATOR PLACEMENT     06/2010 at [facility]   CARDIAC ELECTROPHYSIOLOGY STUDY AND ABLATION     06/2010   PACEMAKER PLACEMENT     06/2010 at [facility]   Family History  Problem Relation Age of Onset   Heart attack Brother    Heart disease Sister        Murmur   Thyroid disease Sister    Diabetes Brother    Social History   Socioeconomic History   Marital status: Married    Spouse name: Maurine Minister   Number of children: 3   Years of education: 12   Highest education level: 12th grade  Occupational History   Occupation: disability    Comment: office work  Tobacco Use   Smoking status: Former    Types: Cigarettes    Quit date: 04/04/2003    Years since quitting: 18.8   Smokeless tobacco: Never  Vaping Use   Vaping Use: Never used  Substance and Sexual Activity   Alcohol use: No   Drug use: No   Sexual activity: Not Currently  Other Topics Concern   Not on file  Social History Narrative   Lives with her spouse, son and 2 grand daughters. She enjoys playing word search and bingo on her tablet.   Social Determinants of Health   Financial Resource Strain: Low Risk  (01/28/2022)   Overall Financial Resource Strain (CARDIA)    Difficulty of Paying Living Expenses: Not hard at all  Food Insecurity: No Food Insecurity (01/28/2022)   Hunger Vital Sign    Worried About Running Out of Food in the Last Year: Never true    Ran Out of Food in the Last Year: Never true  Transportation Needs: No Transportation Needs (01/28/2022)   PRAPARE - Administrator, Civil Service (Medical): No    Lack of Transportation (Non-Medical): No  Physical Activity: Inactive (01/28/2022)   Exercise Vital Sign    Days of Exercise per Week: 0 days     Minutes of Exercise per Session: 0 min  Stress: No Stress Concern Present (01/28/2022)   [employer] of Occupational Health - Occupational Stress Questionnaire    Feeling of Stress : Not at all  Social Connections: Moderately Isolated (01/28/2022)   Social Connection and Isolation Panel [NHANES]    Frequency of Communication with Friends and Family: Twice a week    Frequency of Social Gatherings with Friends and Family: More than three times a week    Attends Religious Services: Never    Database administrator or Organizations: No    Attends Banker Meetings: Never    Marital Status: Married  Activities of Daily Living    01/28/2022   10:06 AM  In your present state of health, do you have any difficulty performing the following activities:  Hearing? 0  Vision? 1  Comment sometimes.  Difficulty concentrating or making decisions? 0  Walking or climbing stairs? 1  Comment due to her legs, imbalance and her breathing.  Dressing or bathing? 0  Doing errands, shopping? 1  Comment she usually goes with someone.  Preparing Food and eating ? N  Using the Toilet? N  In the past six months, have you accidently leaked urine? N  Do you have problems with loss of bowel control? N  Managing your Medications? N  Managing your Finances? N  Housekeeping or managing your Housekeeping? Y  Comment she doesn't have the energy to do it.    Patient Education/ Literacy How often do you need to have someone help you when you read instructions, pamphlets, or other written materials from your doctor or pharmacy?: 1 - Never What is the last grade level you completed in school?: GED  Exercise Current Exercise Habits: The patient does not participate in regular exercise at present, Exercise limited by: orthopedic condition(s);respiratory conditions(s)  Diet Patient reports consuming 2 meals a day and 2 snack(s) a day Patient reports that her primary diet is: Regular Patient reports  that she does have regular access to food.   Depression Screen    01/28/2022   10:00 AM 01/21/2022    9:54 AM 10/02/2021    9:47 AM 06/04/2021    9:28 AM 09/21/2020   11:35 AM 09/08/2020    8:53 AM 08/25/2020    9:25 AM  PHQ 2/9 Scores  PHQ - 2 Score 1 4 4 2 4 1 1   PHQ- 9 Score 6 9 12 5 10 1       Fall Risk    01/28/2022    9:59 AM 01/21/2022    9:54 AM 10/02/2021    9:20 AM 06/04/2021    9:27 AM 09/08/2020    8:53 AM  Fall Risk   Falls in the past year? 1 1 1 1  0  Number falls in past yr: 0 1 0 0 0  Injury with Fall? 1 0 0 1 0  Risk for fall due to : History of fall(s);Impaired mobility History of fall(s) Impaired mobility;Impaired balance/gait No Fall Risks No Fall Risks  Follow up Falls evaluation completed;Education provided;Falls prevention discussed Falls prevention discussed Falls prevention discussed;Falls evaluation completed Follow up appointment;Falls evaluation completed Falls evaluation completed     Objective:  Robin Murillo seemed alert and oriented and she participated appropriately during our telephone visit.  Blood Pressure Weight BMI  BP Readings from Last 3 Encounters:  01/21/22 126/68  10/02/21 109/73  06/04/21 (!) 130/52   Wt Readings from Last 3 Encounters:  01/21/22 250 lb (113.4 kg)  10/02/21 245 lb (111.1 kg)  06/04/21 238 lb (108 kg)   BMI Readings from Last 1 Encounters:  01/21/22 44.29 kg/m    *Unable to obtain current vital signs, weight, and BMI due to telephone visit type  Hearing/Vision  Robin Murillo did not seem to have difficulty with hearing/understanding during the telephone conversation Reports that she has not had a formal eye exam by an eye care professional within the past year Reports that she has not had a formal hearing evaluation within the past year *Unable to fully assess hearing and vision during telephone visit type  Cognitive Function:    01/28/2022   10:12  AM 09/08/2020    9:07 AM 05/12/2019    9:10 AM  6CIT Screen  What  Year? 0 points 0 points 0 points  What month? 0 points 0 points 0 points  What time? 0 points 0 points 0 points  Count back from 20 0 points 0 points 0 points  Months in reverse 0 points 0 points 0 points  Repeat phrase 0 points 0 points 0 points  Total Score 0 points 0 points 0 points   (Normal:0-7, Significant for Dysfunction: >8)  Normal Cognitive Function Screening: Yes   Immunization & Health Maintenance Record Immunization History  Administered Date(s) Administered   Fluad Quad(high Dose 65+) 05/04/2019, 05/29/2020, 06/04/2021   Influenza, High Dose Seasonal PF 04/24/2017, 04/02/2018   Influenza,inj,Quad PF,6+ Mos 04/27/2014, 05/05/2015, 04/19/2016   Influenza-Unspecified 04/08/2013, 05/04/2019, 05/29/2020   PFIZER(Purple Top)SARS-COV-2 Vaccination 08/10/2019, 08/27/2019   Pneumococcal Conjugate-13 01/15/2016   Pneumococcal Polysaccharide-23 04/21/2013, 09/21/2020   Td 05/10/2008   Tdap 05/10/2008   Zoster Recombinat (Shingrix) 10/12/2020, 12/29/2020   Zoster, Live 10/01/2012    Health Maintenance  Topic Date Due   OPHTHALMOLOGY EXAM  01/28/2022 (Originally 09/25/2021)   Diabetic kidney evaluation - Urine ACR  01/29/2022 (Originally 09/21/2021)   COVID-19 Vaccine (3 - Pfizer risk series) 02/13/2022 (Originally 09/24/2019)   TETANUS/TDAP  04/20/2022 (Originally 05/10/2018)   INFLUENZA VACCINE  02/12/2022   HEMOGLOBIN A1C  07/24/2022   Diabetic kidney evaluation - GFR measurement  10/03/2022   FOOT EXAM  10/03/2022   MAMMOGRAM  04/19/2023   DEXA SCAN  04/19/2023   Fecal DNA (Cologuard)  09/21/2023   Pneumonia Vaccine 34+ Years old  Completed   Hepatitis C Screening  Completed   Zoster Vaccines- Shingrix  Completed   HPV VACCINES  Aged Out       Assessment  This is a routine wellness examination for Robin Murillo.  Health Maintenance: Due or Overdue There are no preventive care reminders to display for this patient.   Robin Murillo does not need a referral for  Community Assistance: Care Management:   no Social Work:    no Prescription Assistance:  no Nutrition/Diabetes Education:  no   Plan:  Personalized Goals  Goals Addressed               This Visit's Progress     Patient Stated (pt-stated)        Would like to loose 100 lbs.       Personalized Health Maintenance & Screening Recommendations  Td vaccine Urine ACR- Ordered January 22, 2022. Eye exam  Lung Cancer Screening Recommended: no (Low Dose CT Chest recommended if Age 72-80 years, 30 pack-year currently smoking OR have quit w/in past 15 years) Hepatitis C Screening recommended: no HIV Screening recommended: no  Advanced Directives: Written information was not prepared per patient's request.  Referrals & Orders No orders of the defined types were placed in this encounter.   Follow-up Plan Follow-up with Robin Games, MD as planned Schedule your td vaccine at your pharmacy. Schedule your eye exam. Medicare wellness visit in one year.  Patient will access AVS on my chart.   I have personally reviewed and noted the following in the patient's chart:   Medical and social history Use of alcohol, tobacco or illicit drugs  Current medications and supplements Functional ability and status Nutritional status Physical activity Advanced directives List of other physicians Hospitalizations, surgeries, and ER visits in previous 12 months Vitals Screenings to include cognitive, depression, and falls Referrals  and appointments  In addition, I have reviewed and discussed with Robin Murillo certain preventive protocols, quality metrics, and best practice recommendations. A written personalized care plan for preventive services as well as general preventive health recommendations is available and can be mailed to the patient at her request.      Tinnie Gens, RN BSN  01/28/2022

## 2022-02-13 ENCOUNTER — Other Ambulatory Visit: Payer: Self-pay | Admitting: Family Medicine

## 2022-02-13 DIAGNOSIS — E1129 Type 2 diabetes mellitus with other diabetic kidney complication: Secondary | ICD-10-CM

## 2022-03-21 ENCOUNTER — Other Ambulatory Visit: Payer: Self-pay | Admitting: Family Medicine

## 2022-03-21 DIAGNOSIS — Z1231 Encounter for screening mammogram for malignant neoplasm of breast: Secondary | ICD-10-CM

## 2022-03-28 ENCOUNTER — Other Ambulatory Visit: Payer: Self-pay | Admitting: Family Medicine

## 2022-03-28 DIAGNOSIS — K2100 Gastro-esophageal reflux disease with esophagitis, without bleeding: Secondary | ICD-10-CM

## 2022-04-17 ENCOUNTER — Ambulatory Visit (INDEPENDENT_AMBULATORY_CARE_PROVIDER_SITE_OTHER): Payer: Medicare HMO | Admitting: Physician Assistant

## 2022-04-17 DIAGNOSIS — Z23 Encounter for immunization: Secondary | ICD-10-CM | POA: Diagnosis not present

## 2022-04-24 ENCOUNTER — Ambulatory Visit (INDEPENDENT_AMBULATORY_CARE_PROVIDER_SITE_OTHER): Payer: Medicare HMO | Admitting: Family Medicine

## 2022-04-24 VITALS — BP 104/49 | HR 71 | Ht 63.0 in | Wt 249.0 lb

## 2022-04-24 DIAGNOSIS — J449 Chronic obstructive pulmonary disease, unspecified: Secondary | ICD-10-CM | POA: Diagnosis not present

## 2022-04-24 DIAGNOSIS — R7989 Other specified abnormal findings of blood chemistry: Secondary | ICD-10-CM | POA: Diagnosis not present

## 2022-04-24 DIAGNOSIS — J069 Acute upper respiratory infection, unspecified: Secondary | ICD-10-CM | POA: Diagnosis not present

## 2022-04-24 DIAGNOSIS — E119 Type 2 diabetes mellitus without complications: Secondary | ICD-10-CM | POA: Diagnosis not present

## 2022-04-24 DIAGNOSIS — F418 Other specified anxiety disorders: Secondary | ICD-10-CM

## 2022-04-24 DIAGNOSIS — Z950 Presence of cardiac pacemaker: Secondary | ICD-10-CM | POA: Diagnosis not present

## 2022-04-24 LAB — POCT UA - MICROALBUMIN
Creatinine, POC: 300 mg/dL
Microalbumin Ur, POC: 80 mg/L

## 2022-04-24 LAB — POCT GLYCOSYLATED HEMOGLOBIN (HGB A1C): Hemoglobin A1C: 7.2 % — AB (ref 4.0–5.6)

## 2022-04-24 MED ORDER — FLUOXETINE HCL 20 MG PO CAPS: ORAL_CAPSULE | ORAL | 1 refills | Status: DC

## 2022-04-24 MED ORDER — METFORMIN HCL ER 500 MG PO TB24: ORAL_TABLET | ORAL | 1 refills | Status: DC

## 2022-04-24 NOTE — Progress Notes (Signed)
Established Patient Office Visit  Subjective   Patient ID: Robin Murillo, female    DOB: 1951-07-05  Age: 71 y.o. MRN: [id number]  Chief Complaint  Patient presents with   Diabetes    Follow up     HPI  Diabetes - no hypoglycemic events. No wounds or sores that are not healing well. No increased thirst or urination.  He is off of her metformin but denies any recent changes in diet.  And she admits that she has not been as active lately.  F/U COPD -she did have to use her albuterol this week.  She says for the last week and a half she has had a sore throat and cough and feels like it is probably allergy triggered.  There her husband has also been sick and he ended up seeing his doctor and getting antibiotics.  Follow-up depression/anxiety-she is currently on Prozac 60 mg daily she feels like she is doing okay on her current regimen.  Robin Murillo Office Visit from 04/24/2022 in [place]  PHQ-9 Total Score 10           ROS    Objective:     BP (!) 104/49   Pulse 71   Ht 5\' 3"  (1.6 m)   Wt 249 lb (112.9 kg)   SpO2 92%   BMI 44.11 kg/m    Physical Exam Vitals and nursing note reviewed.  Constitutional:      Appearance: She is well-developed.  HENT:     Head: Normocephalic and atraumatic.     Right Ear: Tympanic membrane, ear canal and external ear normal.     Left Ear: Tympanic membrane, ear canal and external ear normal.     Nose: Nose normal.     Mouth/Throat:     Pharynx: Oropharynx is clear.  Eyes:     Conjunctiva/sclera: Conjunctivae normal.     Pupils: Pupils are equal, round, and reactive to light.  Neck:     Thyroid: No thyromegaly.  Cardiovascular:     Rate and Rhythm: Normal rate and regular rhythm.     Heart sounds: Normal heart sounds.  Pulmonary:     Effort: Pulmonary effort is normal.     Breath sounds: Normal breath sounds. No wheezing.  Musculoskeletal:     Cervical back: Neck supple.   Lymphadenopathy:     Cervical: No cervical adenopathy.  Skin:    General: Skin is warm and dry.  Neurological:     Mental Status: She is alert and oriented to person, place, and time.  Psychiatric:        Behavior: Behavior normal.      Results for orders placed or performed in visit on 04/24/22  POCT UA - Microalbumin  Result Value Ref Range   Microalbumin Ur, POC 80 mg/L   Creatinine, POC 300 mg/dL   Albumin/Creatinine Ratio, Urine, POC 30-300   POCT HgB A1C  Result Value Ref Range   Hemoglobin A1C 7.2 (A) 4.0 - 5.6 %   HbA1c POC (<> result, manual entry)     HbA1c, POC (prediabetic range)     HbA1c, POC (controlled diabetic range)        The 10-year ASCVD risk score (Arnett DK, et al., 2019) is: 15.9%    Assessment & Plan:   Problem List Items Addressed This Visit       Respiratory   COPD (chronic obstructive pulmonary disease) (HCC)    Stable on current regimen.  She did have to use her albuterol this morning she says she has been dealing with some allergies for about the last week and a half.  That her husband's been sick as well and he ended up taking antibiotics.  She has had mostly sore throat and cough.        Endocrine   Diabetes type 2, controlled (HCC) - Primary    Discussed restarting the metformin she actually still has a prescription on file.  Also encouraged her to schedule her eye exam she is overdue.  Continue to work on [employer] and regular exercise.      Relevant Medications   metFORMIN (GLUCOPHAGE-XR) 500 MG 24 hr tablet   Other Relevant Orders   POCT UA - Microalbumin (Completed)   POCT HgB A1C (Completed)     Other   Depression with anxiety    She is having a bump up in symptoms right now but for now we will make no changes continue with current regimen.  Urged her to work on increasing activity level as this helps with mood as well as blood sugars.      Relevant Medications   FLUoxetine (PROZAC) 20 MG capsule   Other  Visit Diagnoses     Viral upper respiratory tract infection           Had flu shot last week.  Due for urine microalbumin.  Upper respiratory infection-if she is not feeling better by the end of the week then please let us know.  Lungs are clear on exam today.  Return in about 3 months (around 07/25/2022) for Diabetes follow-up.    Nani Gasser, MD

## 2022-04-24 NOTE — Assessment & Plan Note (Signed)
She is having a bump up in symptoms right now but for now we will make no changes continue with current regimen.  Urged her to work on increasing activity level as this helps with mood as well as blood sugars.

## 2022-04-24 NOTE — Assessment & Plan Note (Signed)
Stable on current regimen.  She did have to use her albuterol this morning she says she has been dealing with some allergies for about the last week and a half.  That her husband's been sick as well and he ended up taking antibiotics.  She has had mostly sore throat and cough.

## 2022-04-24 NOTE — Assessment & Plan Note (Addendum)
Discussed restarting the metformin she actually still has a prescription on file.  Also encouraged her to schedule her eye exam she is overdue.  Continue to work on [employer] and regular exercise.

## 2022-04-25 ENCOUNTER — Ambulatory Visit: Payer: Medicare HMO

## 2022-04-27 ENCOUNTER — Other Ambulatory Visit: Payer: Self-pay | Admitting: Family Medicine

## 2022-04-28 LAB — IRON,TIBC AND FERRITIN PANEL
%SAT: 37 % (calc) (ref 16–45)
Ferritin: 138 ng/mL (ref 16–288)
Iron: 113 ug/dL (ref 45–160)
TIBC: 309 mcg/dL (calc) (ref 250–450)

## 2022-04-28 LAB — CBC
HCT: 45.2 % — ABNORMAL HIGH (ref 35.0–45.0)
Hemoglobin: 14.7 g/dL (ref 11.7–15.5)
MCH: 30.5 pg (ref 27.0–33.0)
MCHC: 32.5 g/dL (ref 32.0–36.0)
MCV: 93.8 fL (ref 80.0–100.0)
MPV: 11.2 fL (ref 7.5–12.5)
Platelets: 313 10*3/uL (ref 140–400)
RBC: 4.82 10*6/uL (ref 3.80–5.10)
RDW: 12.3 % (ref 11.0–15.0)
WBC: 11.8 10*3/uL — ABNORMAL HIGH (ref 3.8–10.8)

## 2022-04-28 LAB — PROTIME-INR
INR: 1
Prothrombin Time: 10.4 s (ref 9.0–11.5)

## 2022-04-28 LAB — ANTI-NUCLEAR AB-TITER (ANA TITER): ANA Titer 1: 1:40 {titer} — ABNORMAL HIGH

## 2022-04-28 LAB — HEPATIC FUNCTION PANEL
AG Ratio: 1.1 (calc) (ref 1.0–2.5)
ALT: 54 U/L — ABNORMAL HIGH (ref 6–29)
AST: 56 U/L — ABNORMAL HIGH (ref 10–35)
Albumin: 3.9 g/dL (ref 3.6–5.1)
Alkaline phosphatase (APISO): 317 U/L — ABNORMAL HIGH (ref 37–153)
Bilirubin, Direct: 0.1 mg/dL (ref 0.0–0.2)
Globulin: 3.5 g/dL (calc) (ref 1.9–3.7)
Indirect Bilirubin: 0.3 mg/dL (calc) (ref 0.2–1.2)
Total Bilirubin: 0.4 mg/dL (ref 0.2–1.2)
Total Protein: 7.4 g/dL (ref 6.1–8.1)

## 2022-04-28 LAB — TISSUE TRANSGLUTAMINASE ABS,IGG,IGA
(tTG) Ab, IgA: <1 U/mL
(tTG) Ab, IgG: 1.7 U/mL

## 2022-04-28 LAB — MITOCHONDRIAL ANTIBODIES: Mitochondrial M2 Ab, IgG: <=20 U (ref <=20.0)

## 2022-04-28 LAB — IGA: Immunoglobulin A: <5 mg/dL — ABNORMAL LOW (ref 70–320)

## 2022-04-28 LAB — ANA: Anti Nuclear Antibody (ANA): POSITIVE — AB

## 2022-04-28 LAB — ANTI-SMOOTH MUSCLE ANTIBODY, IGG: Actin (Smooth Muscle) Antibody (IGG): <20 U (ref <20)

## 2022-05-01 ENCOUNTER — Encounter: Payer: Self-pay | Admitting: Family Medicine

## 2022-05-01 DIAGNOSIS — D802 Selective deficiency of immunoglobulin A [IgA]: Secondary | ICD-10-CM | POA: Insufficient documentation

## 2022-05-01 NOTE — Progress Notes (Signed)
Hi Robin Murillo, a test called an ANA just slightly elevated.  Its not considered significant unless it is greater than 1-80 so years is okay.  But I do want to keep an eye on this.  Again the plan will be to recheck your liver enzymes in 3 months and in the meantime I really want you to cut back on processed foods.  Eat a lot more fresh vegetables or frozen vegetables.  Cut back on things like red meat.  We also checked your IgA antibody levels which is just a type of antibody.  Years are low.  This does not usually cause a problem but I am get a notated on your chart.

## 2022-05-02 ENCOUNTER — Ambulatory Visit (INDEPENDENT_AMBULATORY_CARE_PROVIDER_SITE_OTHER): Payer: Medicare HMO

## 2022-05-02 DIAGNOSIS — Z1231 Encounter for screening mammogram for malignant neoplasm of breast: Secondary | ICD-10-CM | POA: Diagnosis not present

## 2022-05-03 NOTE — Progress Notes (Signed)
Please call patient. Normal mammogram.  Repeat in 1 year.  

## 2022-07-02 ENCOUNTER — Ambulatory Visit (INDEPENDENT_AMBULATORY_CARE_PROVIDER_SITE_OTHER): Payer: Medicare HMO | Admitting: Pharmacist

## 2022-07-02 DIAGNOSIS — E119 Type 2 diabetes mellitus without complications: Secondary | ICD-10-CM

## 2022-07-02 NOTE — Progress Notes (Signed)
Chronic Care Management Pharmacy Note  07/02/2022 Name:  Robin Murillo MRN:  [id number] DOB:  06/19/1951  Summary: addressed DM, COPD. Patient has restarted metformin after well-controlled A1c and trial off medication. She is doing well back on the medication.  Recommendations/Changes made from today's visit: No changes at this time  Plan: f/u with pharmacist as needed  Subjective: Robin Murillo is an 71 y.o. year old female who is a primary patient of Metheney, Barbarann Ehlers, MD.  The CCM team was consulted for assistance with disease management and care coordination needs.    Engaged with patient by telephone for follow up visit in response to provider referral for pharmacy case management and/or care coordination services.   Consent to Services:  The patient was given information about Chronic Care Management services, agreed to services, and gave verbal consent prior to initiation of services.  Please see initial visit note for detailed documentation.   Patient Care Team: Agapito Games, MD as PCP - General (Family Medicine) Randolm Idol, MD as Referring Physician (Pulmonary Disease) Louie Bun, MD as Referring Physician (Cardiology) Gabriel Carina, [facility] as Pharmacist (Pharmacist) Raynald Blend, OD as Referring Physician (Optometry)  Recent office visits:  01/29/21 Agapito Games MD - Seen for Diabetes - Labs ordered - No medication changes noted - Follow up in 4 months  10/19/20 Clayborne Dana NP - Video visit seen for Viral upper respiratory tract infection - No medication changes noted - No follow up noted   Recent consult visits:  02/13/21 Louie Bun - Cardiology - Seen for Ventricular tachycardia - No notes available  02/08/21 Chi Hoy Register PA - Gastroenterologist - Seen for Gastro-esophageal reflux disease - No notes available  11/16/20 Bertram Savin NP - Cardiology - Seen for atrial flutter - No medication changes noted - Follow up in 6  months 11/16/20 Daymon Larsen - Cardiology - Seen for atrial flutter - No notes available  11/06/20 Louie Bun - Cardiology - Seen for Ventricular tachycardia - No notes available    Hospital visits:  None in previous 6 months  Objective:  Lab Results  Component Value Date   CREATININE 1.02 (H) 10/02/2021   CREATININE 1.08 (H) 06/04/2021   CREATININE 0.84 09/21/2020    Lab Results  Component Value Date   HGBA1C 7.2 (A) 04/24/2022   Last diabetic Eye exam:  Lab Results  Component Value Date/Time   HMDIABEYEEXA No Retinopathy 09/25/2020 12:00 AM        Component Value Date/Time   CHOL 139 10/02/2021 0000   TRIG 73 10/02/2021 0000   HDL 56 10/02/2021 0000   CHOLHDL 2.5 10/02/2021 0000   VLDL 19 07/18/2016 0956   LDLCALC 68 10/02/2021 0000       Latest Ref Rng & Units 04/24/2022   12:00 AM 01/21/2022   12:00 AM 10/18/2021    8:12 AM  Hepatic Function  Total Protein 6.1 - 8.1 g/dL 7.4  7.4  7.3   AST 10 - 35 U/L 56  56  40   ALT 6 - 29 U/L 54  49  41   Total Bilirubin 0.2 - 1.2 mg/dL 0.4  0.5  0.5   Bilirubin, Direct 0.0 - 0.2 mg/dL 0.1  0.1  0.1     Lab Results  Component Value Date/Time   TSH 1.53 06/04/2021 12:00 AM   TSH 0.57 09/21/2020 12:00 AM       Latest Ref Rng & Units 04/24/2022  12:00 AM 10/02/2021   12:00 AM 09/21/2020   12:00 AM  CBC  WBC 3.8 - 10.8 Thousand/uL 11.8  9.3  7.6   Hemoglobin 11.7 - 15.5 g/dL 09.3  23.5  57.3   Hematocrit 35.0 - 45.0 % 45.2  45.9  43.1   Platelets 140 - 400 Thousand/uL 313  292  305     Lab Results  Component Value Date/Time   VD25OH 34 09/21/2020 12:00 AM   VD25OH 33 04/21/2018 07:45 AM    Social History   Tobacco Use  Smoking Status Former   Types: Cigarettes   Quit date: 04/04/2003   Years since quitting: 19.2  Smokeless Tobacco Never   BP Readings from Last 3 Encounters:  04/24/22 (!) 104/49  01/21/22 126/68  10/02/21 109/73   Pulse Readings from Last 3 Encounters:  04/24/22 71   01/21/22 77  10/02/21 71   Wt Readings from Last 3 Encounters:  04/24/22 249 lb (112.9 kg)  01/21/22 250 lb (113.4 kg)  10/02/21 245 lb (111.1 kg)    Assessment: Review of patient past medical history, allergies, medications, health status, including review of consultants reports, laboratory and other test data, was performed as part of comprehensive evaluation and provision of chronic care management services.   SDOH:  (Social Determinants of Health) assessments and interventions performed:  SDOH Interventions    Flowsheet Row Office Visit from 04/24/2022 in [facility] Office Visit from 01/28/2022 in [facility] Office Visit from 01/21/2022 in [facility] Office Visit from 10/02/2021 in [facility] Office Visit from 06/04/2021 in [facility] Office Visit from 09/21/2020 in [facility]  SDOH Interventions        Depression Interventions/Treatment  Counseling Medication Medication Currently on Treatment Currently on Treatment Currently on Treatment       CCM Care Plan  Allergies  Allergen Reactions   Morphine And Related Swelling    Swelling in face   Oxycodone Other (See Comments)    Felt really spacey    Medications Reviewed Today     Reviewed by Elizabeth Palau, LPN (Licensed Practical Nurse) on 04/24/22 at 1046  Med List Status: <None>   Medication Order Taking? Sig Documenting Provider Last Dose Status Informant  acetaminophen (TYLENOL) 325 MG tablet [id number] Yes Take by mouth. [provider] Taking Active   albuterol (VENTOLIN HFA) 108 (90 Base) MCG/ACT inhaler [id number] Yes INHALE 2 PUFFS INTO THE LUNGS EVERY 6 HOURS AS NEEDED FOR WHEEZING OR SHORTNESS OF BREATH Agapito Games, MD Taking Active   ALPRAZolam Prudy Feeler) 0.25 MG tablet [id number]  Yes Take 1 tablet (0.25 mg total) by mouth daily as needed for anxiety. Agapito Games, MD Taking Active   aspirin 81 MG tablet [id number] Yes Take 81 mg by mouth daily. [provider] Taking Active   atorvastatin (LIPITOR) 20 MG tablet [id number] Yes TAKE 1 TABLET (20 MG) NIGHTLY Agapito Games, MD Taking Active   Cholecalciferol (VITAMIN D PO) [id number] Yes Take 2,000 Int'l Units by mouth daily.  [provider] Taking Active   FLUoxetine (PROZAC) 20 MG capsule [id number] Yes TAKE 3 CAPSULES EVERY DAY Agapito Games, MD Taking Active   fluticasone furoate-vilanterol (BREO ELLIPTA) 100-25 MCG/ACT AEPB [id number] Yes Inhale 1 puff into the lungs daily. Agapito Games, MD Taking Active   levothyroxine (  SYNTHROID) 125 MCG tablet [id number] Yes TAKE 1 TABLET EVERY DAY Agapito Games, MD Taking Active   lisinopril (ZESTRIL) 5 MG tablet [id number] Yes TAKE 1 TABLET (5 MG TOTAL) DAILY. Agapito Games, MD Taking Active   magnesium oxide (MAG-OX) 400 MG tablet [id number] Yes Take 400 mg by mouth 2 (two) times daily. [provider] Taking Active   metoprolol (LOPRESSOR) 50 MG tablet [id number] Yes Take 50 mg by mouth 2 (two) times daily. [provider] Taking Active   pantoprazole (PROTONIX) 40 MG tablet [id number] Yes TAKE 1 TABLET EVERY DAY Agapito Games, MD Taking Active   vitamin C (ASCORBIC ACID) 500 MG tablet [id number] Yes Take 500 mg by mouth daily. [provider] Taking Active Self  zinc gluconate 50 MG tablet [id number] Yes Take 50 mg by mouth daily. 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   Cardiac defibrillator in place 01/17/2015   GERD (gastroesophageal reflux disease) 10/12/2014   AR (allergic rhinitis) 09/21/2013   COPD (chronic obstructive pulmonary disease) (HCC) 04/21/2013   Obstructive sleep apnea 02/26/2013   History of ventricular fibrillation 02/22/2013   Pacemaker 12/24/2011   Obesity, Class III, BMI 40-49.9 (morbid obesity) (HCC) 12/04/2011   Diabetes type 2, controlled (HCC) 12/04/2011   Hypothyroid 12/02/2011   Depression with anxiety 12/02/2011   Vitamin D deficiency 06/06/2010   Hereditary and idiopathic peripheral neuropathy 06/05/2010    Immunization History  Administered Date(s) Administered   Fluad Quad(high Dose 65+) 05/04/2019, 05/29/2020, 06/04/2021, 04/17/2022   Influenza, High Dose Seasonal PF 04/24/2017, 04/02/2018   Influenza,inj,Quad PF,6+ Mos 04/27/2014, 05/05/2015, 04/19/2016   Influenza-Unspecified 04/08/2013, 05/04/2019, 05/29/2020   PFIZER(Purple Top)SARS-COV-2 Vaccination 08/10/2019, 08/27/2019   Pneumococcal Conjugate-13 01/15/2016   Pneumococcal Polysaccharide-23 04/21/2013, 09/21/2020   Td 05/10/2008   Td (Adult), 2 Lf Tetanus Toxid, Preservative Free 05/10/2008   Tdap 05/10/2008   Zoster Recombinat (Shingrix) 10/12/2020, 12/29/2020   Zoster, Live 10/01/2012    Conditions to be addressed/monitored: COPD and DMII  There are no care plans that you recently modified to display for this patient.    Medication Assistance: None required.  Patient affirms current coverage meets needs.  Patient's preferred pharmacy is:  [facility] - [place], [place] - [address] [place] [address] Phone: [phone] Fax: [phone]  [facility] - [place], [place] - [address] [place] [address] Phone: [phone] Fax: [phone]  [id number] - [place], [place] - [address] AT [facility] [address]  [place] [address] Phone: [phone] Fax: [phone]  Uses pill box? No - has bottles separate for AM and PM and a routine Pt endorses 100% compliance  Follow Up:  Patient agrees to Care Plan and Follow-up.  Plan: The patient has been provided with contact information for the care management team and has been advised to call with any health related questions or concerns.   Lynnda Shields, PharmD Clinical Pharmacist [facility] At [facility] [phone]

## 2022-07-25 ENCOUNTER — Ambulatory Visit: Payer: Medicare HMO | Admitting: Family Medicine

## 2022-08-13 ENCOUNTER — Encounter: Payer: Self-pay | Admitting: Family Medicine

## 2022-08-13 ENCOUNTER — Ambulatory Visit (INDEPENDENT_AMBULATORY_CARE_PROVIDER_SITE_OTHER): Payer: Medicare HMO | Admitting: Family Medicine

## 2022-08-13 VITALS — BP 119/52 | HR 78 | Wt 250.0 lb

## 2022-08-13 DIAGNOSIS — I472 Ventricular tachycardia, unspecified: Secondary | ICD-10-CM | POA: Diagnosis not present

## 2022-08-13 DIAGNOSIS — J449 Chronic obstructive pulmonary disease, unspecified: Secondary | ICD-10-CM

## 2022-08-13 DIAGNOSIS — R809 Proteinuria, unspecified: Secondary | ICD-10-CM | POA: Diagnosis not present

## 2022-08-13 DIAGNOSIS — E1129 Type 2 diabetes mellitus with other diabetic kidney complication: Secondary | ICD-10-CM | POA: Diagnosis not present

## 2022-08-13 DIAGNOSIS — E559 Vitamin D deficiency, unspecified: Secondary | ICD-10-CM

## 2022-08-13 DIAGNOSIS — E039 Hypothyroidism, unspecified: Secondary | ICD-10-CM

## 2022-08-13 DIAGNOSIS — E119 Type 2 diabetes mellitus without complications: Secondary | ICD-10-CM

## 2022-08-13 LAB — POCT GLYCOSYLATED HEMOGLOBIN (HGB A1C): Hemoglobin A1C: 6.7 % — AB (ref 4.0–5.6)

## 2022-08-13 NOTE — Assessment & Plan Note (Signed)
Continue lisinopril 5mg daily

## 2022-08-13 NOTE — Assessment & Plan Note (Signed)
To recheck TSH today. 

## 2022-08-13 NOTE — Assessment & Plan Note (Signed)
A1c looks great today at 6.7.  Continue current regimen continue working on diet and trying to stay active.  Follow-up in 3 to 4 months.

## 2022-08-13 NOTE — Assessment & Plan Note (Signed)
Recheck vitamin D.  History of osteopenia.

## 2022-08-13 NOTE — Assessment & Plan Note (Signed)
Stable.  No recent flares or exacerbations.

## 2022-08-13 NOTE — Progress Notes (Signed)
Established Patient Office Visit  Subjective   Patient ID: Robin Murillo, female    DOB: Nov 21, 1950  Age: 72 y.o. MRN: [id number]  Chief Complaint  Patient presents with   Diabetes    HPI Diabetes - no hypoglycemic events. No wounds or sores that are not healing well. No increased thirst or urination. Checking glucose at home. Taking medications as prescribed without any side effects.  She has been trying to do better with cutting back on things like potatoes and doing a little reading about foods that are best for diabetes.  She likes vegetables but just has a hard time getting her husband to buy them at the grocery store.  She no longer drives.  COPD-stable.  No recent flares or exacerbations though she does get easily winded with activity and exercise she used to follow with pulmonology but has not gone in quite some time.  She just feels tired all the time and reports that she does not sleep well.  She does have a history of obstructive sleep apnea but has not been using her CPAP in a couple of years.  Hypothyroidism - Taking medication regularly in the AM away from food and vitamins, etc. No recent change to skin, hair, or energy levels.     ROS    Objective:     BP (!) 119/52   Pulse 78   Wt 250 lb (113.4 kg)   SpO2 93%   BMI 44.29 kg/m    Physical Exam Vitals and nursing note reviewed.  Constitutional:      Appearance: She is well-developed.  HENT:     Head: Normocephalic and atraumatic.  Cardiovascular:     Rate and Rhythm: Normal rate and regular rhythm.     Heart sounds: Normal heart sounds.  Pulmonary:     Effort: Pulmonary effort is normal.     Breath sounds: Normal breath sounds.  Skin:    General: Skin is warm and dry.  Neurological:     Mental Status: She is alert and oriented to person, place, and time.  Psychiatric:        Behavior: Behavior normal.     Results for orders placed or performed in visit on 08/13/22  POCT HgB A1C  Result Value  Ref Range   Hemoglobin A1C 6.7 (A) 4.0 - 5.6 %   HbA1c POC (<> result, manual entry)     HbA1c, POC (prediabetic range)     HbA1c, POC (controlled diabetic range)        The 10-year ASCVD risk score (Arnett DK, et al., 2019) is: 20.4%    Assessment & Plan:   Problem List Items Addressed This Visit       Respiratory   COPD (chronic obstructive pulmonary disease) ([place])    Stable.  No recent flares or exacerbations.      Relevant Orders   Hepatic function panel   TSH   VITAMIN D 25 Hydroxy (Vit-D Deficiency, Fractures)     Endocrine   Microalbuminuria due to type 2 diabetes mellitus (HCC)    Continue lisinopril 5 mg daily.      Relevant Orders   Hepatic function panel   TSH   VITAMIN D 25 Hydroxy (Vit-D Deficiency, Fractures)   Hypothyroid    To recheck TSH today.      Relevant Orders   TSH   Diabetes type 2, controlled ([place]) - Primary    A1c looks great today at 6.7.  Continue current regimen continue working on  diet and trying to stay active.  Follow-up in 3 to 4 months.      Relevant Orders   POCT HgB A1C (Completed)   Hepatic function panel   TSH   VITAMIN D 25 Hydroxy (Vit-D Deficiency, Fractures)     Other   Vitamin D deficiency    Recheck vitamin D.  History of osteopenia.      Relevant Orders   Hepatic function panel   TSH   VITAMIN D 25 Hydroxy (Vit-D Deficiency, Fractures)    Return in about 4 months (around 12/12/2022) for Diabetes follow-up.    Beatrice Lecher, MD

## 2022-08-13 NOTE — Patient Instructions (Addendum)
Please get your tetanus shot done at the pharmacy, it he is free and it is good for 10 years.

## 2022-08-14 LAB — HEPATIC FUNCTION PANEL
AG Ratio: 1 (calc) (ref 1.0–2.5)
ALT: 42 U/L — ABNORMAL HIGH (ref 6–29)
AST: 43 U/L — ABNORMAL HIGH (ref 10–35)
Albumin: 3.9 g/dL (ref 3.6–5.1)
Alkaline phosphatase (APISO): 299 U/L — ABNORMAL HIGH (ref 37–153)
Bilirubin, Direct: 0.1 mg/dL (ref 0.0–0.2)
Globulin: 3.9 g/dL (calc) — ABNORMAL HIGH (ref 1.9–3.7)
Indirect Bilirubin: 0.3 mg/dL (calc) (ref 0.2–1.2)
Total Bilirubin: 0.4 mg/dL (ref 0.2–1.2)
Total Protein: 7.8 g/dL (ref 6.1–8.1)

## 2022-08-14 LAB — VITAMIN D 25 HYDROXY (VIT D DEFICIENCY, FRACTURES): Vit D, 25-Hydroxy: 31 ng/mL (ref 30–100)

## 2022-08-14 LAB — TSH: TSH: 2.84 mIU/L (ref 0.40–4.50)

## 2022-08-14 NOTE — Progress Notes (Signed)
OK to increase vitamin D to 5000 IU daily and we can recheck level in 2-3 months

## 2022-08-14 NOTE — Progress Notes (Signed)
Hi Robin Murillo,  Your liver function is still mildly elevated but it is actually little bit better than it has been in the last 6 months it is down from that just slightly.  Thyroid level looks great.  Vitamin D is still low.  I really really want to get that level up I think you will feel better and its better for your bone health to reduce fracture risk etc.  Please make sure you are taking 1000 IU daily the bottle can also say 25 mcg over-the-counter.  If you are already taking that then please increase to 2 tabs daily.

## 2022-09-18 ENCOUNTER — Other Ambulatory Visit: Payer: Self-pay | Admitting: Family Medicine

## 2022-09-18 DIAGNOSIS — F418 Other specified anxiety disorders: Secondary | ICD-10-CM

## 2022-10-30 ENCOUNTER — Telehealth: Payer: Self-pay | Admitting: *Deleted

## 2022-10-30 NOTE — Telephone Encounter (Signed)
Pt called and stated that she had fallen on her birthday. Then on yesterday while standing in her kitchen she got weak and fell again. She was wanting to know if she should get checked out. She said that she is feeling ok now but wanted to know what to do.  I advised her that she should go get checked out at the ED to be sure that there's nothing going on and to follow back up with [place].   She voiced understanding.

## 2022-11-02 ENCOUNTER — Other Ambulatory Visit: Payer: Self-pay | Admitting: Family Medicine

## 2022-11-02 DIAGNOSIS — K2100 Gastro-esophageal reflux disease with esophagitis, without bleeding: Secondary | ICD-10-CM

## 2022-11-06 ENCOUNTER — Other Ambulatory Visit: Payer: Medicare HMO | Admitting: Pharmacist

## 2022-11-06 NOTE — Progress Notes (Signed)
11/06/2022 Name: Robin Murillo MRN: [id number] DOB: January 10, 1951   Robin Murillo is a 72 y.o. year old female who presented for a telephone visit.   They were referred to the pharmacist by a quality report for assistance in managing  quality metric: med adherence diabetes .   Of note, patient contacted office to report falling twice. She shares this information with me as well. Reviewed medications for fall risks, also patient denies symptoms of hypoglycemia or hypotension. Encouraged she check her blood pressure and heart rate occasionally at home and provided education.  Subjective:  Care Team: Primary Care Provider: Agapito Games, MD  Medication Access/Adherence  Current Pharmacy:  [facility] - [place], [place] - [address] [place] [address] Phone: [phone] Fax: [phone]  [facility] - [place], [place] - [address] [place] [address] Phone: [phone] Fax: [phone]  [id number] - [place], [place] - [address] AT [facility] [address] [place] [address] Phone: [phone] Fax: [phone]   Patient reports affordability concerns with their medications: No  Patient reports access/transportation concerns to their pharmacy: No  Patient reports adherence concerns with their medications:  No     Diabetes:  Current medications: metformin  daily   Current glucose readings: not currently checking, appropriate for well-controlled  Patient denies hypoglycemic s/sx including dizziness, shakiness, sweating. Patient denies hyperglycemic symptoms including polyuria, polydipsia, polyphagia, nocturia, neuropathy, blurred vision.   Objective:  Lab Results  Component Value Date   HGBA1C 6.7 (A) 08/13/2022    Lab Results  Component Value Date   CREATININE 1.02 (H) 10/02/2021   BUN 15 10/02/2021   NA 138 10/02/2021    K 4.6 10/02/2021   CL 105 10/02/2021   CO2 24 10/02/2021    Lab Results  Component Value Date   CHOL 139 10/02/2021   HDL 56 10/02/2021   LDLCALC 68 10/02/2021   TRIG 73 10/02/2021   CHOLHDL 2.5 10/02/2021    Medications Reviewed Today     Reviewed by Agapito Games, MD (Physician) on 08/13/22 at [phone]  Med List Status: <None>   Medication Order Taking? Sig Documenting Provider Last Dose Status Informant  acetaminophen (TYLENOL) 325 MG tablet [id number] No Take by mouth. [provider] Taking Active   albuterol (VENTOLIN HFA) 108 (90 Base) MCG/ACT inhaler [id number] No INHALE 2 PUFFS INTO THE LUNGS EVERY 6 HOURS AS NEEDED FOR WHEEZING OR SHORTNESS OF BREATH Agapito Games, MD Taking Active   ALPRAZolam Prudy Feeler) 0.25 MG tablet [id number] No Take 1 tablet (0.25 mg total) by mouth daily as needed for anxiety. Agapito Games, MD Taking Active   aspirin 81 MG tablet [id number] No Take 81 mg by mouth daily. [provider] Taking Active   atorvastatin (LIPITOR) 20 MG tablet [id number] No TAKE 1 TABLET (20 MG) NIGHTLY Agapito Games, MD Taking Active   Cholecalciferol (VITAMIN D PO) [id number] No Take 2,000 Int'l Units by mouth daily.  [provider] Taking Active   FLUoxetine (PROZAC) 20 MG capsule [id number] No TAKE 3 CAPSULES EVERY DAY Agapito Games, MD Taking Active   fluticasone furoate-vilanterol (BREO ELLIPTA) 100-25 MCG/ACT AEPB [id number] No Inhale 1 puff into the lungs daily. Agapito Games, MD Taking Active   levothyroxine (SYNTHROID) 125 MCG tablet [id number] No Take 1 tablet (125 mcg total) by mouth daily. Need labs. Agapito Games, MD Taking  Active   lisinopril (ZESTRIL) 5 MG tablet [id number] No TAKE 1 TABLET (5 MG TOTAL) DAILY. Agapito Games, MD Taking Active   magnesium oxide (MAG-OX) 400 MG tablet [id number] No Take 400 mg by mouth 2 (two) times daily. [provider] Taking Active   metFORMIN  (GLUCOPHAGE-XR) 500 MG 24 hr tablet [id number] No TAKE 1 TABLET BY MOUTH EVERY DAY WITH BREAKFAST Agapito Games, MD Taking Active   metoprolol (LOPRESSOR) 50 MG tablet [id number] No Take 50 mg by mouth 2 (two) times daily. [provider] Taking Active   pantoprazole (PROTONIX) 40 MG tablet [id number] No TAKE 1 TABLET EVERY DAY Agapito Games, MD Taking Active   vitamin C (ASCORBIC ACID) 500 MG tablet [id number] No Take 500 mg by mouth daily. [provider] Taking Active Self  zinc gluconate 50 MG tablet [id number] No Take 50 mg by mouth daily. [provider] Taking Active               Assessment/Plan:   Diabetes: - Currently controlled, Reviewed fill history and confirmed usage with patient, no concerns identified at this time.  - Of note, also reviewed medications with patient as it relates to her recent falls. No concerns identified at this time. - Recommend to continue current regimen    Lynnda Shields, PharmD, BCPS Clinical Pharmacist [facility]

## 2022-11-21 LAB — HM DIABETES EYE EXAM

## 2022-11-26 ENCOUNTER — Telehealth: Payer: Self-pay | Admitting: Family Medicine

## 2022-11-26 NOTE — Telephone Encounter (Signed)
Pls call pt: Normal retinal evaluation. No concerning findings.

## 2022-11-27 NOTE — Telephone Encounter (Signed)
Patient advised of results and recommendations.  

## 2022-12-27 ENCOUNTER — Encounter: Payer: Self-pay | Admitting: Family Medicine

## 2023-01-10 ENCOUNTER — Telehealth: Payer: Self-pay | Admitting: Family Medicine

## 2023-01-10 NOTE — Telephone Encounter (Signed)
Needs DM appt. She is overdue. Please schedule. Also due for labs.

## 2023-02-03 ENCOUNTER — Telehealth: Payer: Self-pay | Admitting: General Practice

## 2023-02-03 NOTE — Telephone Encounter (Addendum)
Patient was scheduled for her medicare wellness visit for 02/03/23 at 10am. Attempted to call the patient x 3 and left three messages. Unable to get in touch with the patient.   Will cancel the appointment to avoid no show fee.

## 2023-02-08 ENCOUNTER — Other Ambulatory Visit: Payer: Self-pay | Admitting: Family Medicine

## 2023-02-08 DIAGNOSIS — E1129 Type 2 diabetes mellitus with other diabetic kidney complication: Secondary | ICD-10-CM

## 2023-02-08 DIAGNOSIS — E119 Type 2 diabetes mellitus without complications: Secondary | ICD-10-CM

## 2023-02-12 DIAGNOSIS — I255 Ischemic cardiomyopathy: Secondary | ICD-10-CM | POA: Diagnosis not present

## 2023-02-12 DIAGNOSIS — Z95810 Presence of automatic (implantable) cardiac defibrillator: Secondary | ICD-10-CM | POA: Diagnosis not present

## 2023-02-13 ENCOUNTER — Other Ambulatory Visit: Payer: Self-pay | Admitting: Family Medicine

## 2023-02-13 ENCOUNTER — Telehealth: Payer: Self-pay | Admitting: Family Medicine

## 2023-02-13 ENCOUNTER — Ambulatory Visit: Payer: Medicare HMO | Admitting: Family Medicine

## 2023-02-13 DIAGNOSIS — J069 Acute upper respiratory infection, unspecified: Secondary | ICD-10-CM

## 2023-02-13 DIAGNOSIS — J449 Chronic obstructive pulmonary disease, unspecified: Secondary | ICD-10-CM

## 2023-02-13 NOTE — Telephone Encounter (Signed)
Patient called in needed a refill on her inhaler/ Please advise.  [facility]  [address] [place], [place]

## 2023-02-14 NOTE — Telephone Encounter (Signed)
Pt called. She is following up on refill on her albuterol.

## 2023-02-17 MED ORDER — ALBUTEROL SULFATE HFA 108 (90 BASE) MCG/ACT IN AERS
2.0000 | INHALATION_SPRAY | Freq: Four times a day (QID) | RESPIRATORY_TRACT | 99 refills | Status: DC | PRN
Start: 2023-02-17 — End: 2024-03-30

## 2023-02-17 NOTE — Addendum Note (Signed)
Addended by: Deno Etienne on: 02/17/2023 01:45 PM   Modules accepted: Orders

## 2023-02-19 NOTE — Telephone Encounter (Signed)
Refill sent pt advised.

## 2023-03-05 ENCOUNTER — Telehealth: Payer: Self-pay | Admitting: Family Medicine

## 2023-03-05 NOTE — Telephone Encounter (Signed)
Pls sched for [employer]

## 2023-03-25 ENCOUNTER — Other Ambulatory Visit: Payer: Self-pay | Admitting: Family Medicine

## 2023-03-25 DIAGNOSIS — Z1231 Encounter for screening mammogram for malignant neoplasm of breast: Secondary | ICD-10-CM

## 2023-04-05 IMAGING — MG MM DIGITAL SCREENING BILAT W/ TOMO AND CAD
6 of 10 series · 6 of 30 positions shown · non-contrast
Comparison: Previous exam(s).

CLINICAL DATA: Screening.

EXAM:
DIGITAL SCREENING BILATERAL MAMMOGRAM WITH TOMOSYNTHESIS AND CAD
TECHNIQUE: Bilateral screening digital craniocaudal and mediolateral oblique
mammograms were obtained. Bilateral screening digital breast
tomosynthesis was performed. The images were evaluated with
computer-aided detection.

[L MLO synth-2D (1 of 2)]
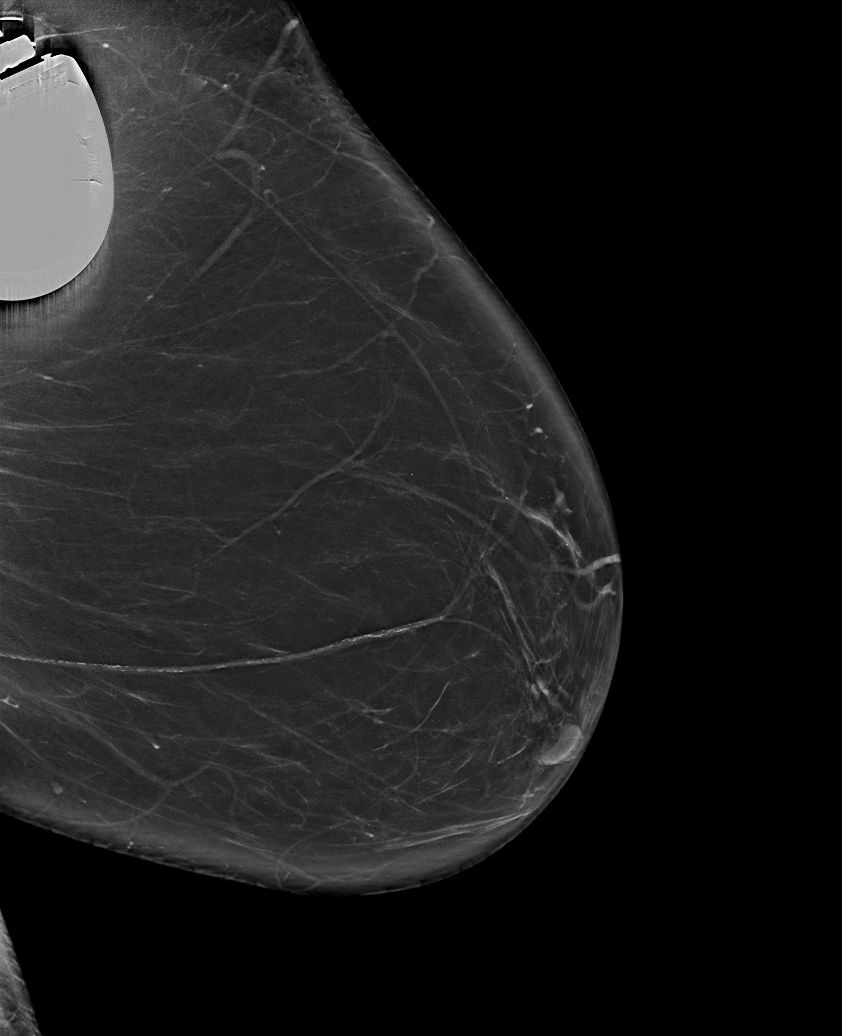

[R CC synth-2D]
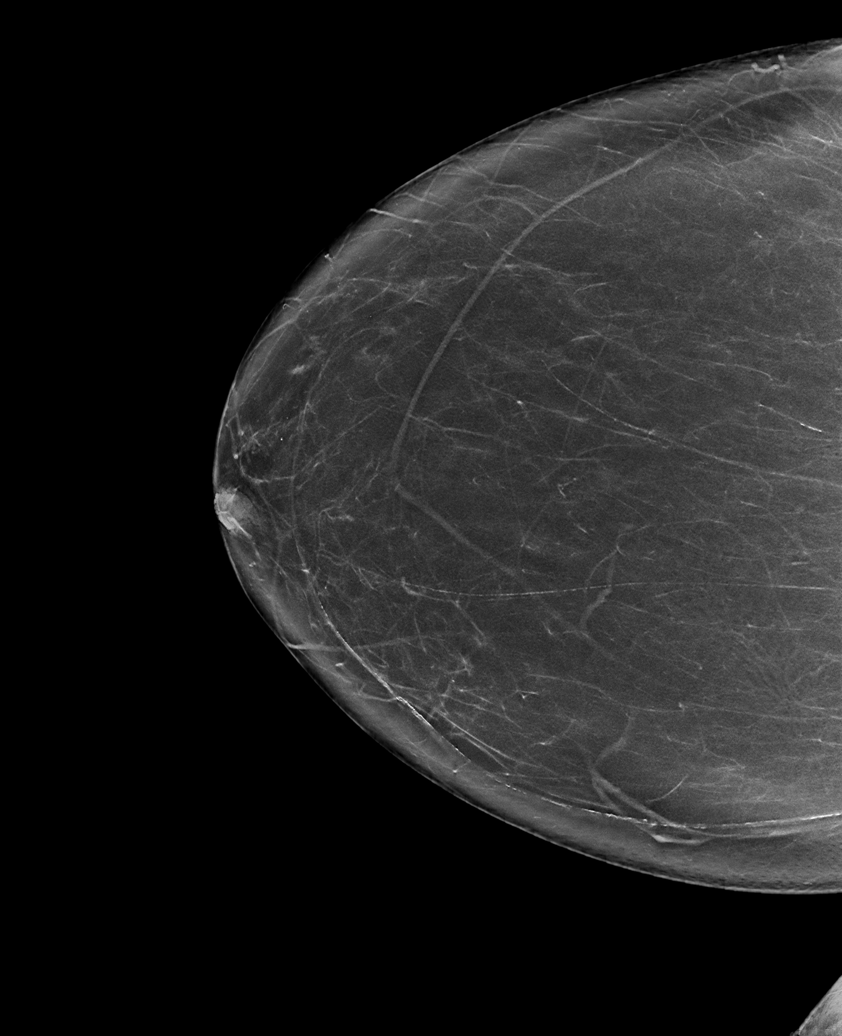

[L MLO synth-2D (2 of 2)]
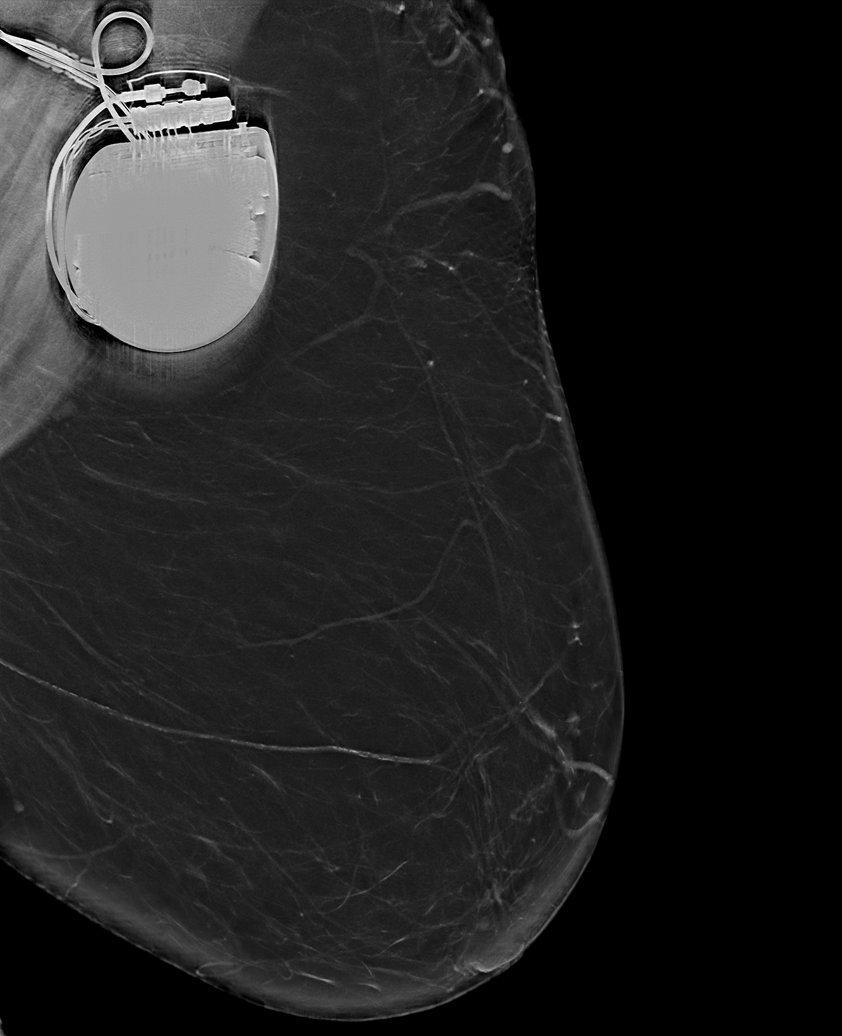

[L CC synth-2D]
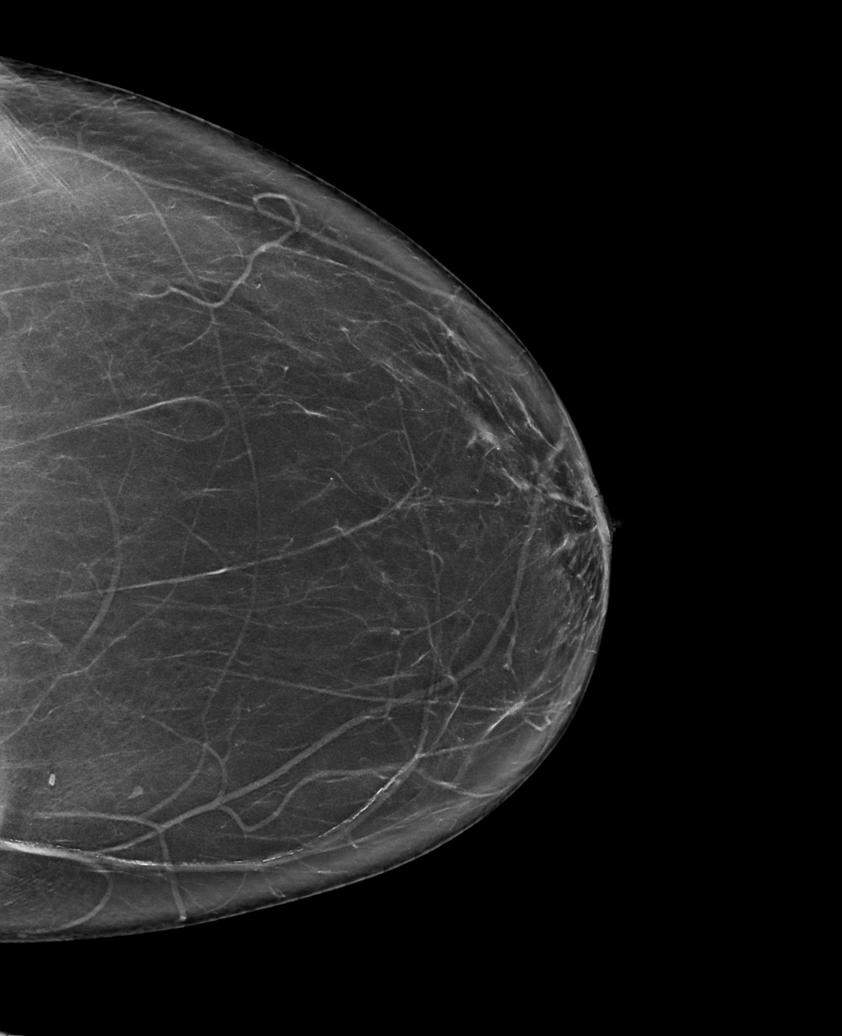

[R MLO synth-2D]
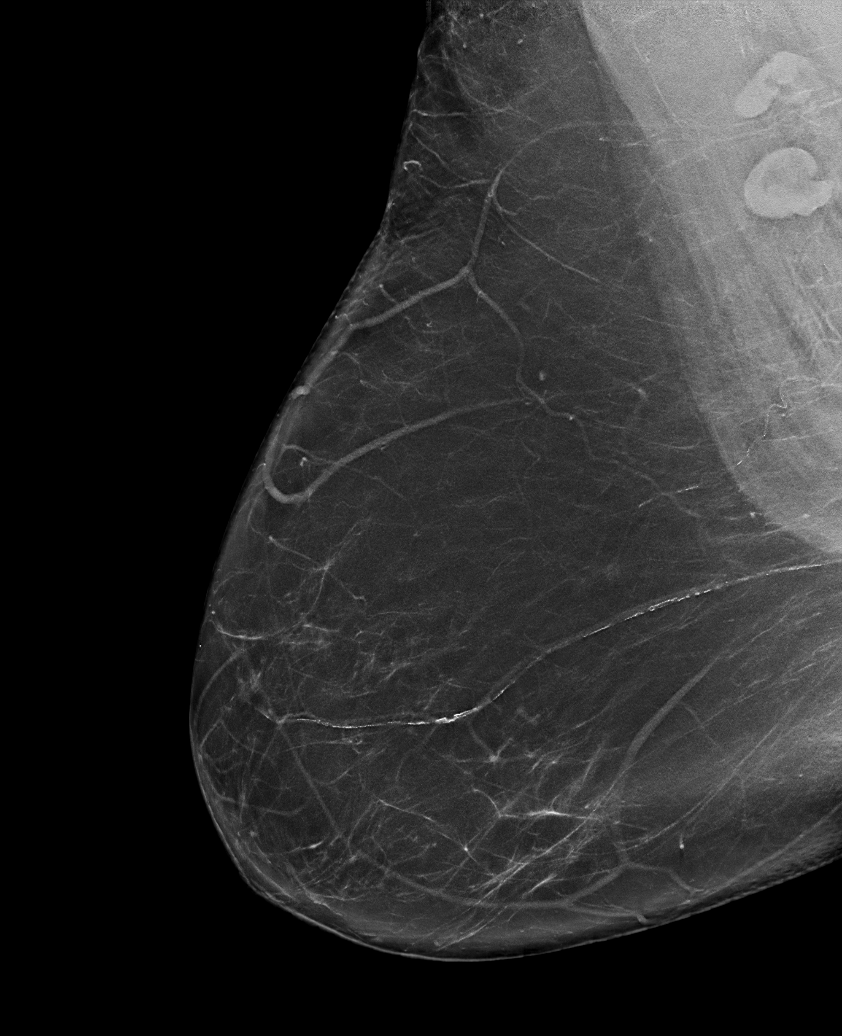

[R MLO tomo · tomo slice 44/87.0]
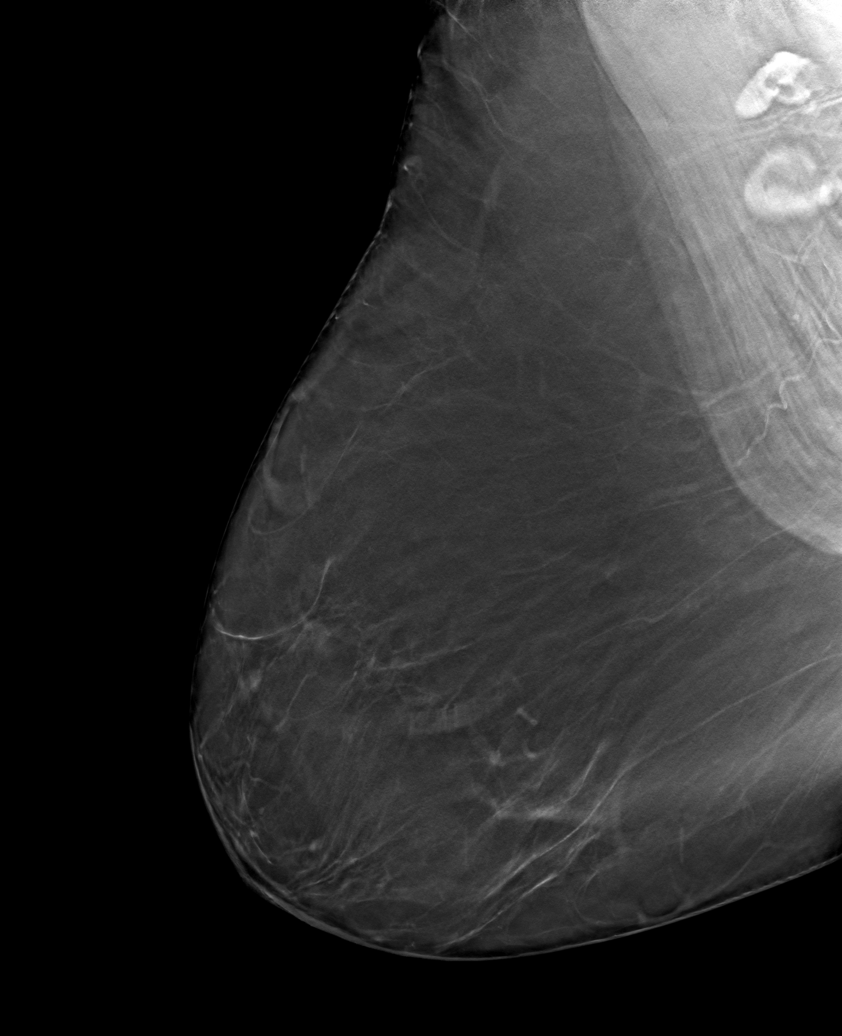

[6 of 30 positions shown; findings below may reference images not displayed]

ACR Breast Density Category b: There are scattered areas of
fibroglandular density.
FINDINGS: There are no findings suspicious for malignancy.
IMPRESSION: No mammographic evidence of malignancy. A result letter of this
screening mammogram will be mailed directly to the patient.

RECOMMENDATION:
Screening mammogram in one year. (Code:51-O-LD2)

BI-RADS CATEGORY  1: Negative.

## 2023-04-07 ENCOUNTER — Ambulatory Visit: Payer: Medicare HMO | Admitting: Family Medicine

## 2023-04-09 ENCOUNTER — Encounter: Payer: Self-pay | Admitting: Family Medicine

## 2023-04-09 ENCOUNTER — Ambulatory Visit (INDEPENDENT_AMBULATORY_CARE_PROVIDER_SITE_OTHER): Payer: Medicare HMO | Admitting: Family Medicine

## 2023-04-09 VITALS — BP 116/73 | HR 80 | Ht 63.0 in | Wt 243.0 lb

## 2023-04-09 DIAGNOSIS — E039 Hypothyroidism, unspecified: Secondary | ICD-10-CM | POA: Diagnosis not present

## 2023-04-09 DIAGNOSIS — E1129 Type 2 diabetes mellitus with other diabetic kidney complication: Secondary | ICD-10-CM

## 2023-04-09 DIAGNOSIS — I472 Ventricular tachycardia, unspecified: Secondary | ICD-10-CM | POA: Diagnosis not present

## 2023-04-09 DIAGNOSIS — R809 Proteinuria, unspecified: Secondary | ICD-10-CM

## 2023-04-09 DIAGNOSIS — E119 Type 2 diabetes mellitus without complications: Secondary | ICD-10-CM

## 2023-04-09 DIAGNOSIS — J449 Chronic obstructive pulmonary disease, unspecified: Secondary | ICD-10-CM

## 2023-04-09 DIAGNOSIS — Z7984 Long term (current) use of oral hypoglycemic drugs: Secondary | ICD-10-CM

## 2023-04-09 DIAGNOSIS — D802 Selective deficiency of immunoglobulin A [IgA]: Secondary | ICD-10-CM

## 2023-04-09 DIAGNOSIS — Z23 Encounter for immunization: Secondary | ICD-10-CM

## 2023-04-09 LAB — POCT GLYCOSYLATED HEMOGLOBIN (HGB A1C): Hemoglobin A1C: 6 % — AB (ref 4.0–5.6)

## 2023-04-09 LAB — POCT UA - MICROALBUMIN
Creatinine, POC: 300 mg/dL
Microalbumin Ur, POC: 150 mg/L

## 2023-04-09 MED ORDER — METFORMIN HCL ER 500 MG PO TB24: ORAL_TABLET | ORAL | 3 refills | Status: DC

## 2023-04-09 NOTE — Assessment & Plan Note (Signed)
Encouraged her to restart her Breo.  And if she needs any refills please let us know.  It has not been filled in some time and I am wondering if it may no longer be on her current formulary.  Just use albuterol as needed.  It sounds like she has some significant allergic component with watery eyes and intermittent sore throat as well.  We discussed treating that with a nasal steroid spray or antihistamine.

## 2023-04-09 NOTE — Assessment & Plan Note (Signed)
Does have a an ICD in place and has an ICD checkup in November.

## 2023-04-09 NOTE — Assessment & Plan Note (Signed)
No recent illnesses or sicknesses.

## 2023-04-09 NOTE — Progress Notes (Signed)
Established Patient Office Visit  Subjective   Patient ID: Robin Murillo, female    DOB: [birth year]  Age: 72 y.o. MRN: [id number]  Chief Complaint  Patient presents with   Diabetes         HPI  Diabetes - no hypoglycemic events. No wounds or sores that are not healing well. No increased thirst or urination. Checking glucose at home. Taking medications as prescribed without any side effects.  F/U COPD -she says she has not been using her Breo lately but has been using her albuterol sometimes up to twice a day.  It has been a little bit better this week.  She still has some Breo at home she just needs to start using it.  Hypothyroidism - Taking medication regularly in the AM away from food and vitamins, etc. No recent change to skin, hair, or energy levels.  Her anxiety has been up. She has had a lot going on this summer and has been using her xanax almost every other day for the lst couple of week. She is taking her fluoxetine.     ROS    Objective:     BP 116/73   Pulse 80   Ht 5\' 3"  (1.6 m)   Wt 243 lb (110.2 kg)   SpO2 93%   BMI 43.05 kg/m    Physical Exam Vitals and nursing note reviewed.  Constitutional:      Appearance: Normal appearance.  HENT:     Head: Normocephalic and atraumatic.  Eyes:     Conjunctiva/sclera: Conjunctivae normal.  Cardiovascular:     Rate and Rhythm: Normal rate and regular rhythm.  Pulmonary:     Effort: Pulmonary effort is normal.     Breath sounds: Normal breath sounds.  Musculoskeletal:     Cervical back: Neck supple.  Skin:    General: Skin is warm and dry.  Neurological:     Mental Status: She is alert.  Psychiatric:        Mood and Affect: Mood normal.     Results for orders placed or performed in visit on 04/09/23  POCT Urine microalbumin-creatinine with uACR  Result Value Ref Range   Microalbumin Ur, POC 150 mg/L   Creatinine, POC 300 mg/dL   Albumin/Creatinine Ratio, Urine, POC 30-300   POCT HgB A1C  Result  Value Ref Range   Hemoglobin A1C 6.0 (A) 4.0 - 5.6 %   HbA1c POC (<> result, manual entry)     HbA1c, POC (prediabetic range)     HbA1c, POC (controlled diabetic range)        The 10-year ASCVD risk score (Arnett DK, et al., 2019) is: 21.8%    Assessment & Plan:   Problem List Items Addressed This Visit       Cardiovascular and Mediastinum   VT (ventricular tachycardia) (HCC)    Does have a an ICD in place and has an ICD checkup in November.        Respiratory   COPD (chronic obstructive pulmonary disease) (HCC)    Encouraged her to restart her Breo.  And if she needs any refills please let us know.  It has not been filled in some time and I am wondering if it may no longer be on her current formulary.  Just use albuterol as needed.  It sounds like she has some significant allergic component with watery eyes and intermittent sore throat as well.  We discussed treating that with a nasal steroid spray or antihistamine.  Relevant Orders   POCT Urine microalbumin-creatinine with uACR (Completed)   CMP14+EGFR   Lipid panel   TSH     Endocrine   Microalbuminuria due to type 2 diabetes mellitus (HCC)    Continues to have a small amount of protein in the urine.  She is on a low-dose ACE inhibitor.      Relevant Medications   metFORMIN (GLUCOPHAGE-XR) 500 MG 24 hr tablet   Hypothyroid    Currently asymptomatic.  Due to recheck TSH.      Relevant Orders   POCT Urine microalbumin-creatinine with uACR (Completed)   CMP14+EGFR   Lipid panel   TSH   Diabetes type 2, controlled (HCC) - Primary    C looks great today at 6.0.  Continue current regimen continue with metformin.  Follow-up in 4 months.      Relevant Medications   metFORMIN (GLUCOPHAGE-XR) 500 MG 24 hr tablet   Other Relevant Orders   POCT Urine microalbumin-creatinine with uACR (Completed)   CMP14+EGFR   Lipid panel   TSH   POCT HgB A1C (Completed)     Other   Obesity, Class III, BMI 40-49.9 (morbid  obesity) (HCC)    BMI 43.  She actually is down about 7 pounds since she was last here which is absolutely fantastic and her A1c is improving.  She has been trying to cut out some of the snacking and trying to eat a little bit more healthy so just encouraged her to get to continue forward with those great changes!      Relevant Medications   metFORMIN (GLUCOPHAGE-XR) 500 MG 24 hr tablet   IgA deficiency (HCC)    No recent illnesses or sicknesses.      Other Visit Diagnoses     Encounter for immunization       Relevant Orders   Flu Vaccine Trivalent High Dose (Fluad) (Completed)       Return in about 4 months (around 08/09/2023) for Diabetes follow-up.    Nani Gasser, MD

## 2023-04-09 NOTE — Assessment & Plan Note (Signed)
BMI 43.  She actually is down about 7 pounds since she was last here which is absolutely fantastic and her A1c is improving.  She has been trying to cut out some of the snacking and trying to eat a little bit more healthy so just encouraged her to get to continue forward with those great changes!

## 2023-04-09 NOTE — Assessment & Plan Note (Signed)
Continues to have a small amount of protein in the urine.  She is on a low-dose ACE inhibitor.

## 2023-04-09 NOTE — Assessment & Plan Note (Signed)
C looks great today at 6.0.  Continue current regimen continue with metformin.  Follow-up in 4 months.

## 2023-04-09 NOTE — Assessment & Plan Note (Signed)
Currently asymptomatic.  Due to recheck TSH.

## 2023-04-10 LAB — CMP14+EGFR
ALT: 41 IU/L — ABNORMAL HIGH (ref 0–32)
AST: 50 IU/L — ABNORMAL HIGH (ref 0–40)
Albumin: 4.1 g/dL (ref 3.8–4.8)
Alkaline Phosphatase: 329 IU/L — ABNORMAL HIGH (ref 44–121)
BUN/Creatinine Ratio: 12 (ref 12–28)
BUN: 13 mg/dL (ref 8–27)
Bilirubin Total: 0.4 mg/dL (ref 0.0–1.2)
CO2: 19 mmol/L — ABNORMAL LOW (ref 20–29)
Calcium: 10.2 mg/dL (ref 8.7–10.3)
Chloride: 103 mmol/L (ref 96–106)
Creatinine, Ser: 1.11 mg/dL — ABNORMAL HIGH (ref 0.57–1.00)
Globulin, Total: 3.5 g/dL (ref 1.5–4.5)
Glucose: 139 mg/dL — ABNORMAL HIGH (ref 70–99)
Potassium: 4.7 mmol/L (ref 3.5–5.2)
Sodium: 139 mmol/L (ref 134–144)
Total Protein: 7.6 g/dL (ref 6.0–8.5)
eGFR: 53 mL/min/{1.73_m2} — ABNORMAL LOW (ref >59)

## 2023-04-10 LAB — TSH: TSH: 2.27 u[IU]/mL (ref 0.450–4.500)

## 2023-04-10 LAB — LIPID PANEL
Chol/HDL Ratio: 2.9 ratio (ref 0.0–4.4)
Cholesterol, Total: 149 mg/dL (ref 100–199)
HDL: 51 mg/dL (ref >39)
LDL Chol Calc (NIH): 80 mg/dL (ref 0–99)
Triglycerides: 94 mg/dL (ref 0–149)
VLDL Cholesterol Cal: 18 mg/dL (ref 5–40)

## 2023-04-10 NOTE — Progress Notes (Signed)
Hi Robin Murillo,  Kidney function is stable at 1.1.  Liver enzymes are still mildly elevated similar to what they have been in the past.   Will continue to work on healthy diet and regular exercise.  As much as you are able to do this will make a big difference in reducing the inflammation in your liver.  Cholesterol and thyroid look great.

## 2023-04-26 ENCOUNTER — Other Ambulatory Visit: Payer: Self-pay | Admitting: Family Medicine

## 2023-04-26 DIAGNOSIS — R809 Proteinuria, unspecified: Secondary | ICD-10-CM

## 2023-05-07 ENCOUNTER — Ambulatory Visit: Payer: Medicare HMO

## 2023-05-07 DIAGNOSIS — Z1231 Encounter for screening mammogram for malignant neoplasm of breast: Secondary | ICD-10-CM

## 2023-05-08 NOTE — Progress Notes (Signed)
Please call patient. Normal mammogram.  Repeat in 1 year.  

## 2023-07-01 DIAGNOSIS — I4892 Unspecified atrial flutter: Secondary | ICD-10-CM | POA: Diagnosis not present

## 2023-07-01 DIAGNOSIS — Z95810 Presence of automatic (implantable) cardiac defibrillator: Secondary | ICD-10-CM | POA: Diagnosis not present

## 2023-07-01 DIAGNOSIS — I4729 Other ventricular tachycardia: Secondary | ICD-10-CM | POA: Diagnosis not present

## 2023-07-01 DIAGNOSIS — I4581 Long QT syndrome: Secondary | ICD-10-CM | POA: Diagnosis not present

## 2023-07-01 DIAGNOSIS — I10 Essential (primary) hypertension: Secondary | ICD-10-CM | POA: Diagnosis not present

## 2023-07-01 DIAGNOSIS — I472 Ventricular tachycardia, unspecified: Secondary | ICD-10-CM | POA: Diagnosis not present

## 2023-07-11 ENCOUNTER — Other Ambulatory Visit: Payer: Self-pay | Admitting: Family Medicine

## 2023-07-11 DIAGNOSIS — F418 Other specified anxiety disorders: Secondary | ICD-10-CM

## 2023-08-06 ENCOUNTER — Ambulatory Visit (INDEPENDENT_AMBULATORY_CARE_PROVIDER_SITE_OTHER): Payer: Medicare HMO | Admitting: Family Medicine

## 2023-08-06 ENCOUNTER — Other Ambulatory Visit: Payer: Self-pay

## 2023-08-06 ENCOUNTER — Encounter: Payer: Self-pay | Admitting: Family Medicine

## 2023-08-06 VITALS — BP 122/53 | HR 81 | Ht 63.0 in | Wt 240.5 lb

## 2023-08-06 DIAGNOSIS — E1129 Type 2 diabetes mellitus with other diabetic kidney complication: Secondary | ICD-10-CM | POA: Diagnosis not present

## 2023-08-06 DIAGNOSIS — F418 Other specified anxiety disorders: Secondary | ICD-10-CM

## 2023-08-06 DIAGNOSIS — E119 Type 2 diabetes mellitus without complications: Secondary | ICD-10-CM | POA: Diagnosis not present

## 2023-08-06 DIAGNOSIS — R809 Proteinuria, unspecified: Secondary | ICD-10-CM | POA: Diagnosis not present

## 2023-08-06 DIAGNOSIS — Z7984 Long term (current) use of oral hypoglycemic drugs: Secondary | ICD-10-CM | POA: Diagnosis not present

## 2023-08-06 DIAGNOSIS — E039 Hypothyroidism, unspecified: Secondary | ICD-10-CM

## 2023-08-06 DIAGNOSIS — M8580 Other specified disorders of bone density and structure, unspecified site: Secondary | ICD-10-CM

## 2023-08-06 LAB — POCT GLYCOSYLATED HEMOGLOBIN (HGB A1C): HbA1c, POC (controlled diabetic range): 6 % (ref 0.0–7.0)

## 2023-08-06 MED ORDER — ALPRAZOLAM 0.25 MG PO TABS
0.2500 mg | ORAL_TABLET | Freq: Every day | ORAL | 0 refills | Status: AC | PRN
Start: 2023-08-06 — End: ?

## 2023-08-06 MED ORDER — ATORVASTATIN CALCIUM 20 MG PO TABS: ORAL_TABLET | ORAL | 3 refills | Status: DC

## 2023-08-06 NOTE — Addendum Note (Signed)
Addended by: Elizabeth Palau on: 08/06/2023 12:50 PM   Modules accepted: Orders

## 2023-08-06 NOTE — Assessment & Plan Note (Signed)
A1c looks great today at 6.0.  She is doing great and has lost 10 pounds over the last year just encouraged her to continue to work on portions and healthy options and increasing vegetables and decreasing carb intake.

## 2023-08-06 NOTE — Assessment & Plan Note (Signed)
Last TSH looked great.  Continue current regimen.  Lab Results  Component Value Date   TSH 2.270 04/09/2023

## 2023-08-06 NOTE — Progress Notes (Signed)
Established Patient Office Visit  Subjective  Patient ID: Robin Murillo, female    DOB: 07/12/1951  Age: 73 y.o. MRN: [id number]  Chief Complaint  Patient presents with   Diabetes   nasal drainage     Off and on x couple of weeks    HPI  Diabetes - no hypoglycemic events. No wounds or sores that are not healing well. No increased thirst or urination. Checking glucose at home. Taking medications as prescribed without any side effects.  Complains of nasal congestion and drainage on and off for couple of weeks.    ROS    Objective:     BP (!) 122/53   Pulse 81   Ht 5\' 3"  (1.6 m)   Wt 240 lb 8 oz (109.1 kg)   SpO2 95%   BMI 42.60 kg/m    Physical Exam Vitals and nursing note reviewed.  Constitutional:      Appearance: Normal appearance.  HENT:     Head: Normocephalic and atraumatic.  Eyes:     Conjunctiva/sclera: Conjunctivae normal.  Cardiovascular:     Rate and Rhythm: Normal rate and regular rhythm.  Pulmonary:     Effort: Pulmonary effort is normal.     Breath sounds: Normal breath sounds.  Skin:    General: Skin is warm and dry.  Neurological:     Mental Status: She is alert.  Psychiatric:        Mood and Affect: Mood normal.      No results found for any visits on 08/06/23.    The 10-year ASCVD risk score (Arnett DK, et al., 2019) is: 24.4%    Assessment & Plan:   Problem List Items Addressed This Visit       Endocrine   Microalbuminuria due to type 2 diabetes mellitus (HCC)   Last tested in September.  She is on a low-dose ACE inhibitor.      Relevant Medications   atorvastatin (LIPITOR) 20 MG tablet   Hypothyroid   Last TSH looked great.  Continue current regimen.  Lab Results  Component Value Date   TSH 2.270 04/09/2023         Diabetes type 2, controlled (HCC) - Primary   A1c looks great today at 6.0.  She is doing great and has lost 10 pounds over the last year just encouraged her to continue to work on portions and  healthy options and increasing vegetables and decreasing carb intake.      Relevant Medications   atorvastatin (LIPITOR) 20 MG tablet     Musculoskeletal and Integument   Osteopenia   Due to repeat      Relevant Orders   DG Bone Density     Other   Depression with anxiety   She still gets really anxious at times she is currently on fluoxetine 60 mg but does not want to adjust her dose.  She has been using a little bit of old alprazolam here there.  She does notice she gets a little bit more irritable as well.  She is also getting a little more anxious about being around others and leaving the house.  We did discuss that she would really benefit from working with a therapist especially around social anxieties and leaving the house.  She will think about it.      Relevant Medications   ALPRAZolam (XANAX) 0.25 MG tablet    Return in about 4 months (around 11/27/2023) for Diabetes follow-up.    Nani Gasser, MD

## 2023-08-06 NOTE — Assessment & Plan Note (Signed)
She still gets really anxious at times she is currently on fluoxetine 60 mg but does not want to adjust her dose.  She has been using a little bit of old alprazolam here there.  She does notice she gets a little bit more irritable as well.  She is also getting a little more anxious about being around others and leaving the house.  We did discuss that she would really benefit from working with a therapist especially around social anxieties and leaving the house.  She will think about it.

## 2023-08-06 NOTE — Assessment & Plan Note (Signed)
Due to repeat

## 2023-08-06 NOTE — Assessment & Plan Note (Signed)
Last tested in September.  She is on a low-dose ACE inhibitor.

## 2023-08-11 DIAGNOSIS — I472 Ventricular tachycardia, unspecified: Secondary | ICD-10-CM | POA: Diagnosis not present

## 2023-08-11 DIAGNOSIS — Z95810 Presence of automatic (implantable) cardiac defibrillator: Secondary | ICD-10-CM | POA: Diagnosis not present

## 2023-08-23 ENCOUNTER — Other Ambulatory Visit: Payer: Self-pay | Admitting: Family Medicine

## 2023-08-23 DIAGNOSIS — K2100 Gastro-esophageal reflux disease with esophagitis, without bleeding: Secondary | ICD-10-CM

## 2023-08-27 ENCOUNTER — Other Ambulatory Visit: Payer: Medicare HMO

## 2023-08-28 DIAGNOSIS — M6281 Muscle weakness (generalized): Secondary | ICD-10-CM | POA: Diagnosis not present

## 2023-08-28 DIAGNOSIS — E119 Type 2 diabetes mellitus without complications: Secondary | ICD-10-CM | POA: Diagnosis not present

## 2023-08-28 DIAGNOSIS — Z8674 Personal history of sudden cardiac arrest: Secondary | ICD-10-CM | POA: Diagnosis not present

## 2023-08-28 DIAGNOSIS — J449 Chronic obstructive pulmonary disease, unspecified: Secondary | ICD-10-CM | POA: Diagnosis not present

## 2023-08-28 DIAGNOSIS — W19XXXA Unspecified fall, initial encounter: Secondary | ICD-10-CM | POA: Diagnosis not present

## 2023-08-28 DIAGNOSIS — R296 Repeated falls: Secondary | ICD-10-CM | POA: Diagnosis not present

## 2023-08-28 DIAGNOSIS — Z9181 History of falling: Secondary | ICD-10-CM | POA: Diagnosis not present

## 2023-08-28 DIAGNOSIS — R42 Dizziness and giddiness: Secondary | ICD-10-CM | POA: Diagnosis not present

## 2023-08-28 DIAGNOSIS — R000 Tachycardia, unspecified: Secondary | ICD-10-CM | POA: Diagnosis not present

## 2023-08-28 DIAGNOSIS — I499 Cardiac arrhythmia, unspecified: Secondary | ICD-10-CM | POA: Diagnosis not present

## 2023-08-28 DIAGNOSIS — I10 Essential (primary) hypertension: Secondary | ICD-10-CM | POA: Diagnosis not present

## 2023-08-28 DIAGNOSIS — E079 Disorder of thyroid, unspecified: Secondary | ICD-10-CM | POA: Diagnosis not present

## 2023-08-28 DIAGNOSIS — Z043 Encounter for examination and observation following other accident: Secondary | ICD-10-CM | POA: Diagnosis not present

## 2023-08-28 DIAGNOSIS — S0101XA Laceration without foreign body of scalp, initial encounter: Secondary | ICD-10-CM | POA: Diagnosis not present

## 2023-08-28 DIAGNOSIS — Z95810 Presence of automatic (implantable) cardiac defibrillator: Secondary | ICD-10-CM | POA: Diagnosis not present

## 2023-08-29 ENCOUNTER — Telehealth: Payer: Self-pay

## 2023-08-29 NOTE — Transitions of Care (Post Inpatient/ED Visit) (Signed)
   08/29/2023  Name: Robin Murillo MRN: [id number] DOB: [birth year]  Today's TOC FU Call Status: Today's TOC FU Call Status:: Successful TOC FU Call Completed TOC FU Call Complete Date: 08/29/23  Attempted to reach the patient regarding the most recent Inpatient/ED visit.  Follow Up Plan: Additional outreach attempts will be made to reach the patient to complete the Transitions of Care (Post Inpatient/ED visit) call.   Signature Karena Addison, LPN [facility] Nurse Health Advisor Direct Dial [phone]

## 2023-09-01 NOTE — Transitions of Care (Post Inpatient/ED Visit) (Unsigned)
   09/01/2023  Name: Robin Murillo MRN: [id number] DOB: [birth year]  Today's TOC FU Call Status: Today's TOC FU Call Status:: Unsuccessful Call (2nd Attempt) Unsuccessful Call (2nd Attempt) Date: 09/01/23 [facility] FU Call Complete Date: 08/29/23  Attempted to reach the patient regarding the most recent Inpatient/ED visit.  Follow Up Plan: Additional outreach attempts will be made to reach the patient to complete the Transitions of Care (Post Inpatient/ED visit) call.   Signature Karena Addison, LPN [facility] Nurse Health Advisor Direct Dial [phone]

## 2023-09-02 NOTE — Transitions of Care (Post Inpatient/ED Visit) (Signed)
   09/02/2023  Name: Robin Murillo MRN: [id number] DOB: 1950/07/17  Today's TOC FU Call Status: Today's TOC FU Call Status:: Unsuccessful Call (3rd Attempt) Unsuccessful Call (2nd Attempt) Date: 09/01/23 Unsuccessful Call (3rd Attempt) Date: 09/02/23 [facility] FU Call Complete Date: 08/29/23  Attempted to reach the patient regarding the most recent Inpatient/ED visit.  Follow Up Plan: No further outreach attempts will be made at this time. We have been unable to contact the patient.  Signature Karena Addison, LPN [facility] Nurse Health Advisor Direct Dial [phone]

## 2023-09-10 ENCOUNTER — Other Ambulatory Visit: Payer: Medicare HMO

## 2023-09-11 ENCOUNTER — Ambulatory Visit (INDEPENDENT_AMBULATORY_CARE_PROVIDER_SITE_OTHER): Payer: Medicare HMO | Admitting: Family Medicine

## 2023-09-11 VITALS — BP 124/52 | HR 77 | Ht 63.0 in | Wt 241.0 lb

## 2023-09-11 DIAGNOSIS — R29898 Other symptoms and signs involving the musculoskeletal system: Secondary | ICD-10-CM

## 2023-09-11 DIAGNOSIS — R296 Repeated falls: Secondary | ICD-10-CM | POA: Diagnosis not present

## 2023-09-11 DIAGNOSIS — F418 Other specified anxiety disorders: Secondary | ICD-10-CM

## 2023-09-11 MED ORDER — FLUOXETINE HCL 40 MG PO CAPS: 40.0000 mg | ORAL_CAPSULE | Freq: Two times a day (BID) | ORAL | 1 refills | Status: DC

## 2023-09-11 NOTE — Progress Notes (Signed)
 Established Patient Office Visit  Subjective  Patient ID: Robin Murillo, female    DOB: 10/28/1950  Age: 73 y.o. MRN: [id number]  Chief Complaint  Patient presents with   Hospitalization Follow-up    Fall Suture removal    HPI  She was seen in the emergency department at [facility] on February 13.  She was reaching up into her cabinet and fell backwards and hit her head.  She was not on any blood thinners.  Does not normally use a cane or a walker.  Head CT just showed a subcutaneous hematoma.  They did repair laceration on her scalp with staples.  He has fallen once more since the ED visit.    She is doing well she has not had any residual headaches.  But did want to talk about her frequent falling.  Her husband is here with her.  He says that she fell again since being home in the bathtub.  She denies any lightheadedness chest pain or dizziness when she falls.  Her husband says occasionally she just says her legs give out.  She is extremely sedentary and he thinks that could be contributing to the falls.  They have a small home so she prematures from the bedroom to the kitchen to the bathroom and then back to the bedroom and that said she does not get out of the house anymore after she fell at a local retail store.  They are interested in formal physical therapy to try to build up her strength.      ROS    Objective:     BP (!) 124/52   Pulse 77   Ht 5\' 3"  (1.6 m)   Wt 241 lb (109.3 kg)   SpO2 95%   BMI 42.69 kg/m    Physical Exam Vitals and nursing note reviewed.  Constitutional:      Appearance: Normal appearance.  HENT:     Head: Normocephalic and atraumatic.  Eyes:     Conjunctiva/sclera: Conjunctivae normal.  Cardiovascular:     Rate and Rhythm: Normal rate and regular rhythm.  Pulmonary:     Effort: Pulmonary effort is normal.     Breath sounds: Normal breath sounds.  Skin:    General: Skin is warm and dry.  Neurological:     Mental Status: She is  alert.  Psychiatric:        Mood and Affect: Mood normal.      No results found for any visits on 09/11/23.    The 10-year ASCVD risk score (Arnett DK, et al., 2019) is: 25.1%    Assessment & Plan:   Problem List Items Addressed This Visit       Other   Frequent falls - Primary   Depression with anxiety   She feels like she is still having some OCD type behaviors and says that the 10 Xanax a month is not lasting a whole month.  We had a discussion about the fact that we need to better control her anxiety instead of going up on the Xanax because it actually increases fall risk which is already an issue.  Will increase fluoxetine to 80 mg and see if that is helpful.      Relevant Medications   FLUoxetine (PROZAC) 40 MG capsule   Other Visit Diagnoses       Weakness of both lower extremities           Lower extremity weakness-her husband says that they did get a  call from physical therapy after the ED visit.  He is gena try to give them a call back that he may need [place] to put in a formal referral but he thinks the ED may have done it so he will let us know he would prefer to have the PT done at [facility].  No follow-ups on file.    Nani Gasser, MD

## 2023-09-11 NOTE — Assessment & Plan Note (Signed)
 She feels like she is still having some OCD type behaviors and says that the 10 Xanax a month is not lasting a whole month.  We had a discussion about the fact that we need to better control her anxiety instead of going up on the Xanax because it actually increases fall risk which is already an issue.  Will increase fluoxetine to 80 mg and see if that is helpful.

## 2023-10-07 DIAGNOSIS — R531 Weakness: Secondary | ICD-10-CM | POA: Diagnosis not present

## 2023-10-07 DIAGNOSIS — R2689 Other abnormalities of gait and mobility: Secondary | ICD-10-CM | POA: Diagnosis not present

## 2023-10-13 ENCOUNTER — Ambulatory Visit: Payer: Self-pay | Admitting: *Deleted

## 2023-10-13 NOTE — Telephone Encounter (Signed)
 Copied from CRM [id number]. Topic: Clinical - Red Word Triage >> Oct 13, 2023  1:36 PM [name] wrote: Red Word that prompted transfer to Nurse Triage: fell a week ago  hit head in the driveway , still hurting  a lot of pain rib area , patient thought she her a crack sound not sure Reason for Disposition  [1] MODERATE weakness (i.e., interferes with work, school, normal activities) AND [2] new-onset or worsening  Answer Assessment - Initial Assessment Questions 1. MECHANISM: "How did the fall happen?"     I fell last week.   I was getting in the car and lost my footing and I fell back.  I hit my head on the concrete.   I think I heard something crack.   My right side hurts so bad.   It hurts to move and lay down.    My right breast area.   I think I cracked a rib.   I didn't go to ED or urgent care.   I'm in so much pain.  I'm taking Tylenol.   I'm afraid of falling.    2. DOMESTIC VIOLENCE AND ELDER ABUSE SCREENING: "Did you fall because someone pushed you or tried to hurt you?" If Yes, ask: "Are you safe now?"     N/A 3. ONSET: "When did the fall happen?" (e.g., minutes, hours, or days ago)     My husband has gone to get my granddaughter at school.     Last Tues. I fell.   4. LOCATION: "What part of the body hit the ground?" (e.g., back, buttocks, head, hips, knees, hands, head, stomach)     My head and my right side.   I was going to get my license renewed when the fall happened in my granddaughter.    5. INJURY: "Did you hurt (injure) yourself when you fell?" If Yes, ask: "What did you injure? Tell me more about this?" (e.g., body area; type of injury; pain severity)"     My head seems ok.   It was a little sore.   No cuts or wounds.   I still have a place in the back of my head where I had staples.   They have been out.   I hit my head on the gravel when I fell back.   6. PAIN: "Is there any pain?" If Yes, ask: "How bad is the pain?" (e.g., Scale 1-10; or mild,  moderate, severe)   - NONE  (0): No pain   - MILD (1-3): Doesn't interfere with normal activities    - MODERATE (4-7): Interferes with normal activities or awakens from sleep    - SEVERE (8-10): Excruciating pain, unable to do any normal activities      Yes severe pain in right side. 7. SIZE: For cuts, bruises, or swelling, ask: "How large is it?" (e.g., inches or centimeters)      Head a little sore.    My right side is very painful 8. PREGNANCY: "Is there any chance you are pregnant?" "When was your last menstrual period?"     N/A 9. OTHER SYMPTOMS: "Do you have any other symptoms?" (e.g., dizziness, fever, weakness; new onset or worsening).      It hurts so bad in my right side when I move.   My back hurts a little 10. CAUSE: "What do you think caused the fall (or falling)?" (e.g., tripped, dizzy spell)       I fell backwards and hit my head  on the gravel and hit my right side over my ribs.  I suffered through the pain for a week.  Protocols used: Falls and Falling-A-AH  Chief Complaint: Robin Murillo a week ago.   Hit right side and heard something crack.  Having severe pain in right rib area.   Also hit head on gravel but denies injuries to her head other than a little sore.   She lost her footing while getting in the car in her driveway. Symptoms: Severe pain in right rib area Frequency: Since she fell last Tues. Pertinent Negatives: Patient denies wounds, cuts or anything to her head.   Disposition: [] ED /[] Urgent Care (no appt availability in office) / [x] Appointment(In office/virtual)/ []  [facility]/ [] Home Care/ [] Refused Recommended Disposition /[] [facility]/ []  Follow-up with PCP Additional Notes: Appt made for 10/14/2023 with Dr. Linford Arnold.

## 2023-10-14 ENCOUNTER — Ambulatory Visit (INDEPENDENT_AMBULATORY_CARE_PROVIDER_SITE_OTHER): Admitting: Family Medicine

## 2023-10-14 ENCOUNTER — Ambulatory Visit (INDEPENDENT_AMBULATORY_CARE_PROVIDER_SITE_OTHER)

## 2023-10-14 ENCOUNTER — Encounter: Payer: Self-pay | Admitting: Family Medicine

## 2023-10-14 DIAGNOSIS — R0789 Other chest pain: Secondary | ICD-10-CM | POA: Diagnosis not present

## 2023-10-14 DIAGNOSIS — R0781 Pleurodynia: Secondary | ICD-10-CM

## 2023-10-14 DIAGNOSIS — R296 Repeated falls: Secondary | ICD-10-CM | POA: Diagnosis not present

## 2023-10-14 DIAGNOSIS — R079 Chest pain, unspecified: Secondary | ICD-10-CM | POA: Diagnosis not present

## 2023-10-14 NOTE — Assessment & Plan Note (Signed)
 Started PT but not really doing any of the exercises they gave her to do on her own at home.  Just reminded her that to really get gains she will have to do the homework.

## 2023-10-14 NOTE — Progress Notes (Signed)
   Acute Office Visit  Subjective:     Patient ID: Robin Murillo, female    DOB: 1951-04-03, 73 y.o.   MRN: [id number]  Chief Complaint  Patient presents with   Rib Injury    HPI Patient is in today for right rib pain.  She said about a week ago she actually fell out of the car.  Her husband was on the other side of the car so he did not see exactly how she fell but when he came around the side of the car she was flat on her back but since then she has complained of right lateral rib pain it hurts if she takes a deep breath and it is sore if she turns a certain way.  She has not really had a lot of back pain which is reassuring.  She did start physical therapy recently to try to improve her mobility but is only been once and so far has not done any of the home exercises so her husband is concerned that she is not going to really get better if she is not putting in the effort and it is a lot for him to transport her.  ROS      Objective:    There were no vitals taken for this visit.   Physical Exam Vitals and nursing note reviewed.  Constitutional:      Appearance: Normal appearance.  HENT:     Head: Normocephalic and atraumatic.  Eyes:     Conjunctiva/sclera: Conjunctivae normal.  Cardiovascular:     Rate and Rhythm: Normal rate and regular rhythm.  Pulmonary:     Effort: Pulmonary effort is normal.     Breath sounds: Normal breath sounds.  Musculoskeletal:     Comments: And are over the right lateral rib cage nontender over the thoracic spine.  Skin:    General: Skin is warm and dry.  Neurological:     Mental Status: She is alert.  Psychiatric:        Mood and Affect: Mood normal.     No results found for any visits on 10/14/23.      Assessment & Plan:   Problem List Items Addressed This Visit       Other   Frequent falls   Started PT but not really doing any of the exercises they gave her to do on her own at home.  Just reminded her that to really get  gains she will have to do the homework.      Other Visit Diagnoses       Rib pain on right side    -  Primary   Relevant Orders   DG Ribs Unilateral W/Chest Right       Possible rib fracture-we did discuss option of getting x-rays will move forward with that.  Can splint the area if she coughs or sneezes we do not recommend using rib belts anymore we will call the results once available.  Strongly encouraged her to continue to work on her PT for strengthening her lower extremities.  She says sometimes she just does not feel like it but I reminded her the importance of sticking to it to try to really get some gains with strength and balance and mobility  No orders of the defined types were placed in this encounter.   No follow-ups on file.  Nani Gasser, MD

## 2023-10-16 ENCOUNTER — Telehealth: Payer: Self-pay | Admitting: Family Medicine

## 2023-10-16 ENCOUNTER — Telehealth: Payer: Self-pay

## 2023-10-16 ENCOUNTER — Ambulatory Visit: Payer: Self-pay

## 2023-10-16 NOTE — Telephone Encounter (Signed)
 Copied from CRM [id number]. Topic: Clinical - Medication Question >> Oct 16, 2023  9:02 AM Robin Murillo wrote: Reason for CRM:   Patient wanted to have Dr. Linford Arnold look into a natural supplement for liver called [facility]. The patient stated its suppose to only include natural ingredients (one being milk thistle). The patient was hoping it would help her with her recent diagnosis of fatty liver. Please advise.    Patient was looking into to taking apple cider vinegar 450 mg tablets but wanted to ensure it was ok to take as well.  Call back Number: [phone]

## 2023-10-16 NOTE — Telephone Encounter (Signed)
 Imaging has not resulted  Did you want me to call and request change to STAT read?

## 2023-10-16 NOTE — Telephone Encounter (Signed)
 Copied from CRM [id number]. Topic: Clinical - Lab/Test Results >> Oct 16, 2023  8:59 AM [name] F wrote: Reason for CRM:   Patient called in looking to speak to someone regarding the results of her DG Ribs Unilateral W/Chest Right (Accession [id number]) (Order [id number]) Imaging completed on 10/14/2023   Phone Number: [phone]

## 2023-10-16 NOTE — Telephone Encounter (Signed)
  Chief Complaint: pain in right rib cage Symptoms: pain Frequency:over 1 week Pertinent Negatives: Patient denies sob Disposition: [] ED /[] Urgent Care (no appt availability in office) / [x] Appointment(In office/virtual)/ []  [facility]/ [x] Home Care/ [x] Refused Recommended Disposition /[] [facility]/ []  Follow-up with PCP Additional Notes: pt state that she is still having pain in right right rib cage after her fall last week. States that she also wanted to know the results of her x-ray. States that she thought they would have given her a muscle relaxer or some else for the pain. States that she was taking tylenol for pain but hasn't taken anything for pain in 2 days.Patient offered appt but declined.   Copied from CRM [id number]. Topic: Clinical - Red Word Triage >> Oct 16, 2023  9:14 AM [name] F wrote: [facility] that prompted transfer to Nurse Triage:  Pain on Right side after a fall about a week Reason for Disposition  [1] After 72 hours AND [2] chest pain not improving  Answer Assessment - Initial Assessment Questions 1. LOCATION: "Where does it hurt?"       Right rib cage 2. RADIATION: "Does the pain go anywhere else?" (e.g., into neck, jaw, arms, back)     no 3. ONSET: "When did the chest pain begin?" (Minutes, hours or days)      A week 4. PATTERN: "Does the pain come and go, or has it been constant since it started?"  "Does it get worse with exertion?"      constant 5. DURATION: "How long does it last" (e.g., seconds, minutes, hours)     constant 6. SEVERITY: "How bad is the pain?"  (e.g., Scale 1-10; mild, moderate, or severe)    - MILD (1-3): doesn't interfere with normal activities     - MODERATE (4-7): interferes with normal activities or awakens from sleep    - SEVERE (8-10): excruciating pain, unable to do any normal activities       6/10 with out medication  9. CAUSE: "What do you think is causing the chest pain?"     Larey Seat a week ago  10. OTHER  SYMPTOMS: "Do you have any other symptoms?" (e.g., dizziness, nausea, vomiting, sweating, fever, difficulty breathing, cough)       no  Protocols used: Chest Pain-A-AH, Chest Injury-A-AH

## 2023-10-16 NOTE — Telephone Encounter (Signed)
 Please review the previous msg for E2C2. Sent a teams msg to the imaging department to change the reading to a STAT request.

## 2023-10-16 NOTE — Telephone Encounter (Signed)
 Yes please

## 2023-10-16 NOTE — Telephone Encounter (Signed)
 I would say no on the liver 1.  Milk thistle has actually been shown to increase liver enzymes.  Biggest thing to help her liver is to really clean up her diet eat more fish and vegetables less carbs and crackers and processed foods and get moving.  It is much more powerful than a supplement.    But if she wants to do the apple cider vinegar that is perfectly fine.

## 2023-10-17 NOTE — Telephone Encounter (Signed)
 Called and spoke with pt letting her know the info per Dr. Linford Arnold and she verbalized understanding. Nothing further needed.

## 2023-10-17 NOTE — Telephone Encounter (Signed)
 Spoke with Gabe in radiology reading room - he is changing image to STAT read.

## 2023-10-20 ENCOUNTER — Encounter: Payer: Self-pay | Admitting: Family Medicine

## 2023-10-20 NOTE — Progress Notes (Signed)
 Henreitta, they did not see any displaced rib fractures that still does not rule out a tiny crack so it may still take a couple of weeks for you to feel a lot better.  Just try to make sure that you are up and moving around if you need to put a little pressure on that area when you cough or sneeze sometimes that can help as well.

## 2023-10-22 DIAGNOSIS — R531 Weakness: Secondary | ICD-10-CM | POA: Diagnosis not present

## 2023-10-24 IMAGING — US US ABDOMEN COMPLETE W/ ELASTOGRAPHY
2 series · 12 of 25 positions shown · non-contrast
Comparison: None.

CLINICAL DATA: Elevated liver enzymes

EXAM:
ULTRASOUND ABDOMEN
ULTRASOUND HEPATIC ELASTOGRAPHY
TECHNIQUE: Sonography of the upper abdomen was performed. In addition,
ultrasound elastography evaluation of the liver was performed. A
region of interest was placed within the right lobe of the liver.
Following application of a compressive sonographic pulse, tissue
compressibility was assessed. Multiple assessments were performed at
the selected site. Median tissue compressibility was determined.
Previously, hepatic stiffness was assessed by shear wave velocity.
Based on recently published Society of Radiologists in Ultrasound
consensus article, reporting is now recommended to be performed in
the SI units of pressure (kiloPascals) representing hepatic
stiffness/elasticity. The obtained result is compared to the
published reference standards. (cACLD = compensated Advanced Chronic
Liver Disease)

[Series 1: us abdomen complete w/elastography · 11 of 98 slices shown]
[im 5/98]
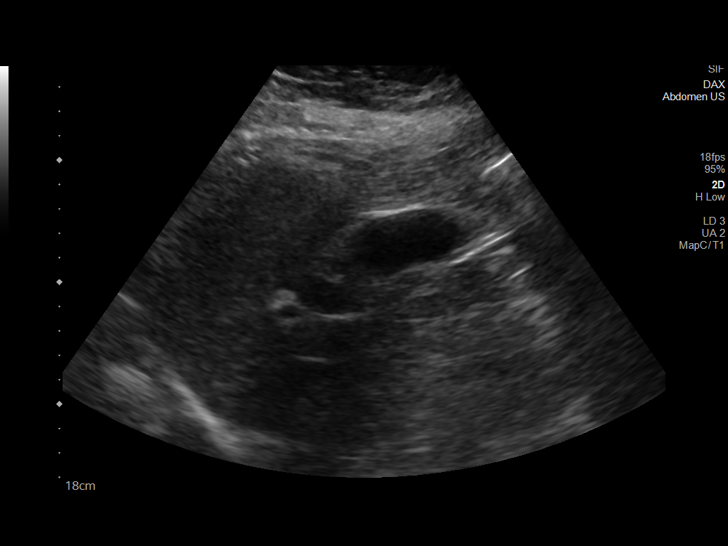
[im 14/98]
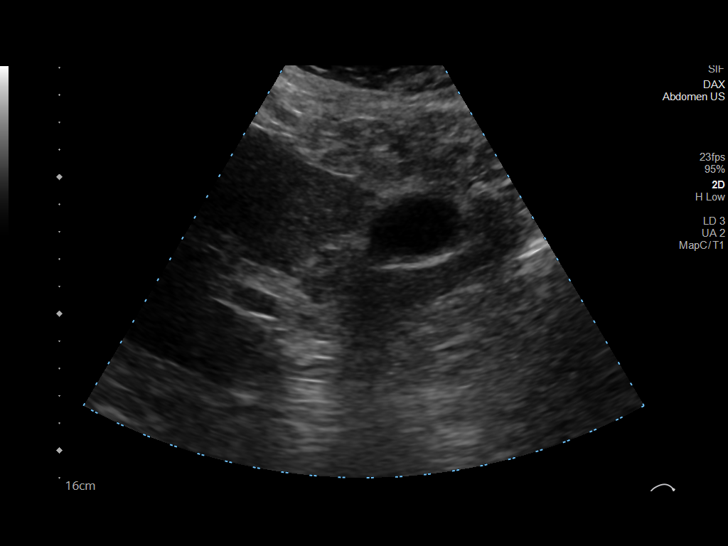
[im 24/98]
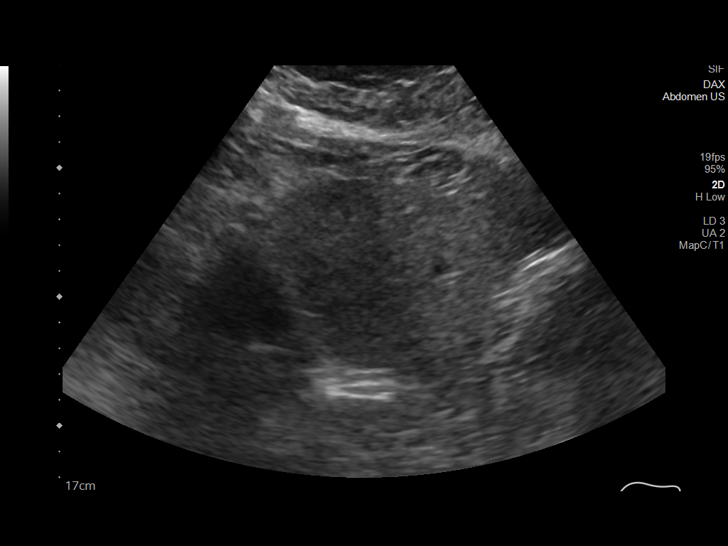
[im 33/98]
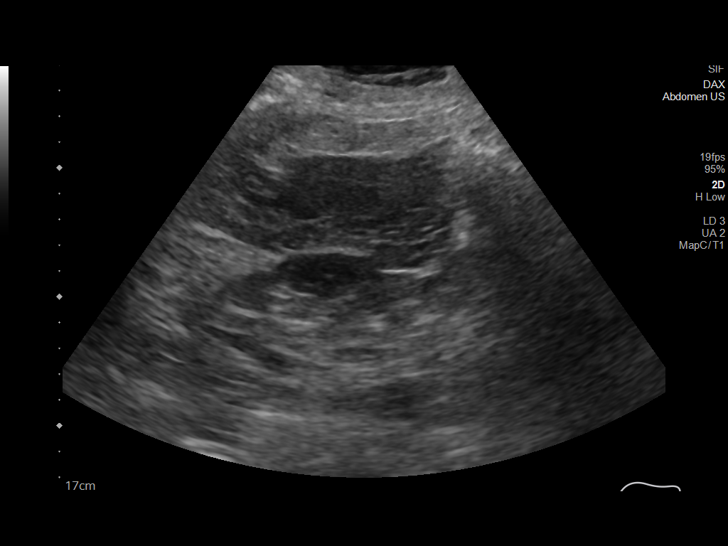
[im 42/98]
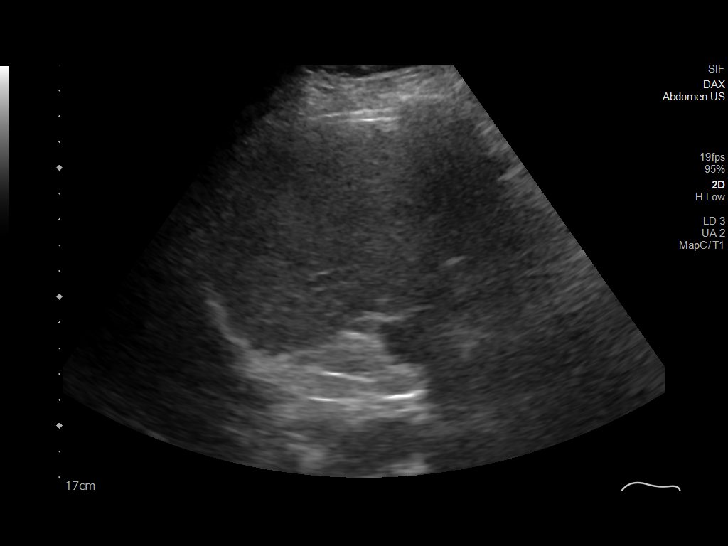
[im 51/98]
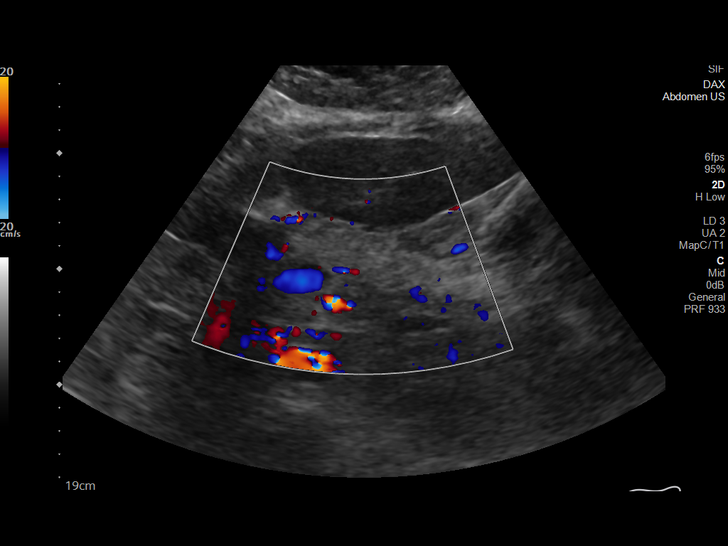
[im 61/98]
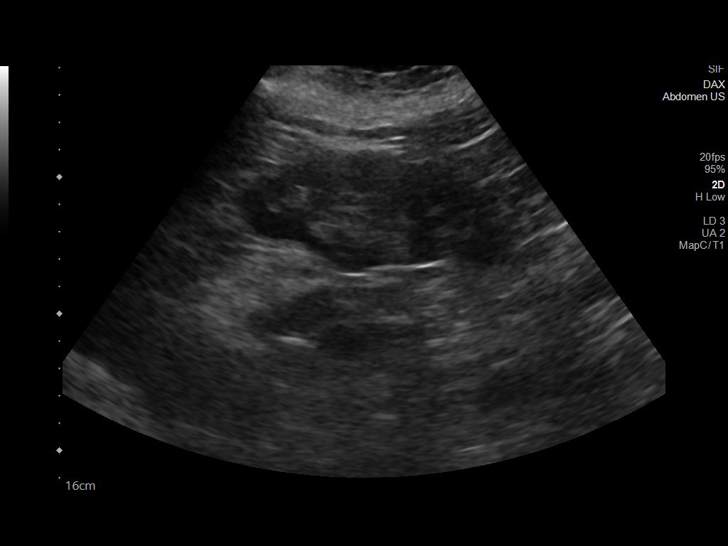
[im 70/98]
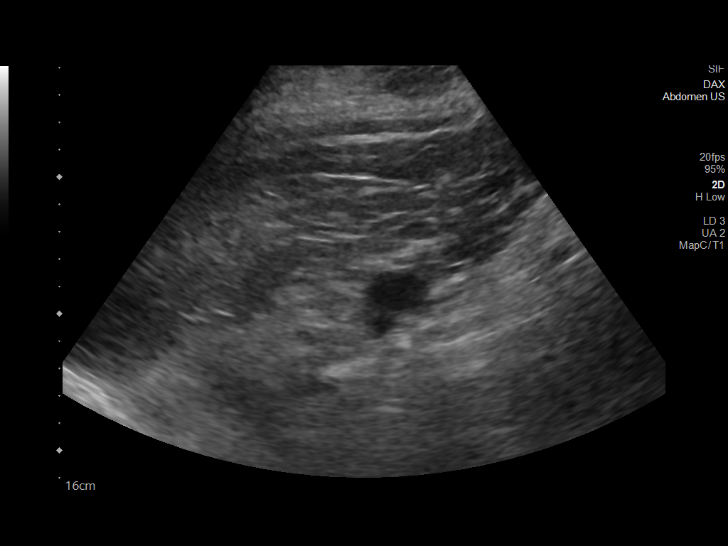
[im 79/98]
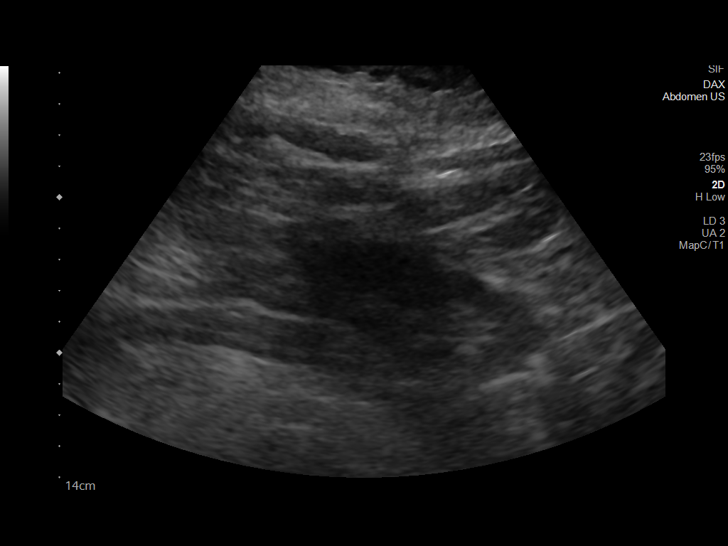
[im 88/98]
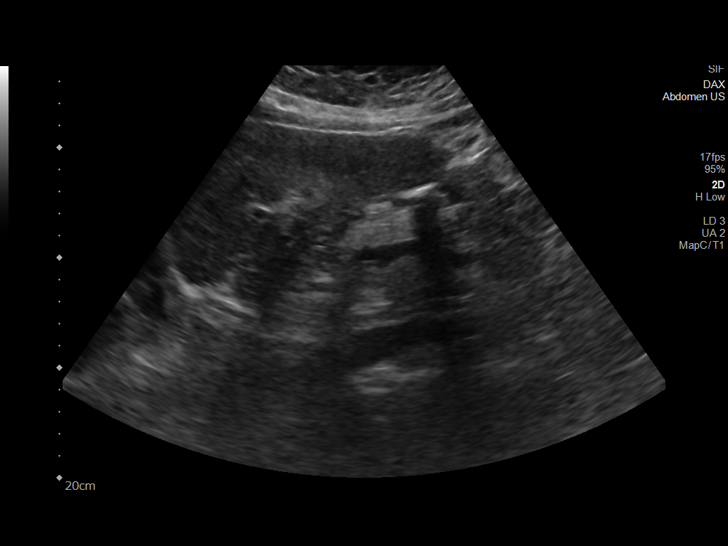
[im 98/98]
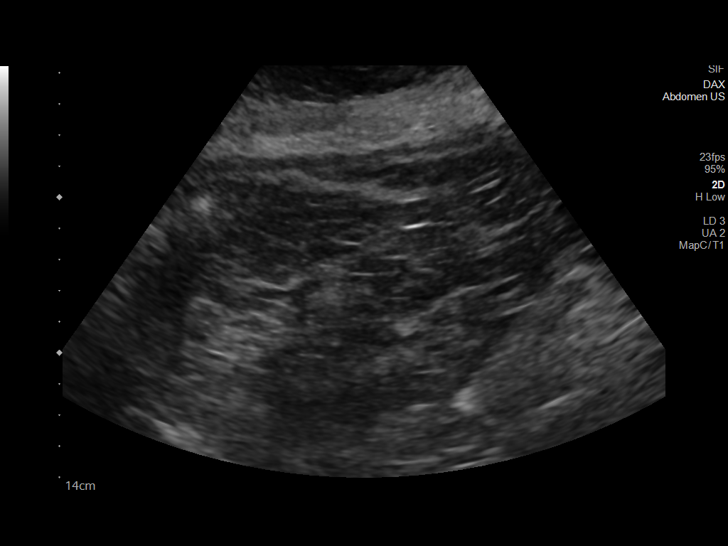

[Series 1001: abdomen us · 1 of 13 slices shown]
[im 7/13]
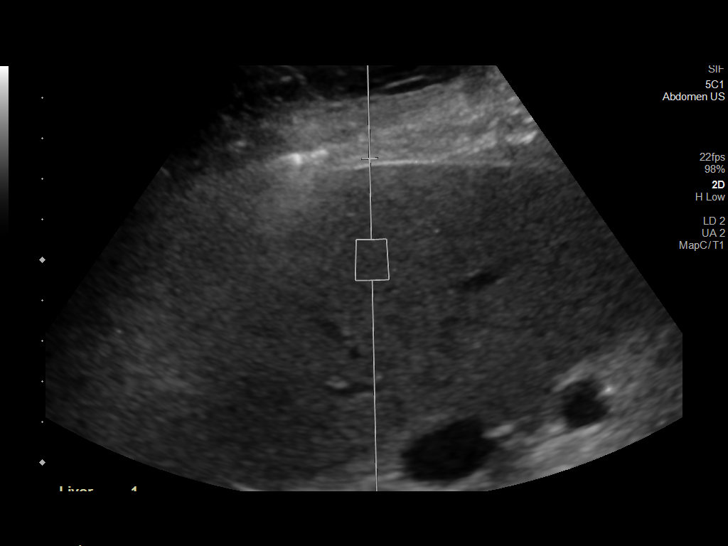

[12 of 25 positions shown; findings below may reference images not displayed]

FINDINGS: ULTRASOUND ABDOMEN

Gallbladder: No gallstones or wall thickening visualized. No
sonographic Murphy sign noted by sonographer.

Common bile duct: Diameter: 0.5 cm

Liver: No focal lesion identified. Increased parenchymal
echogenicity. Portal vein is patent on color Doppler imaging with
normal direction of blood flow towards the liver.

IVC: No abnormality visualized.

Pancreas: Visualized portion unremarkable.

Spleen: Size and appearance within normal limits.

Right Kidney: Length: 11.3 cm. Echogenicity within normal limits. No
mass or hydronephrosis visualized.

Left Kidney: Length: 11.2 cm. Echogenicity within normal limits. No
mass or hydronephrosis visualized.

Abdominal aorta: No aneurysm visualized.

Other findings: None.

ULTRASOUND HEPATIC ELASTOGRAPHY

Device: Siemens Helix VTQ

Patient position: Oblique

Transducer 5C1

Number of measurements: 10

Hepatic segment:  8

Median kPa:

IQR:

IQR/Median kPa ratio:

Data quality: IQR/Median kPa ratio of 0.3 or greater indicates
reduced accuracy

Diagnostic category:  < or = 5 kPa: high probability of being normal

The use of hepatic elastography is applicable to patients with viral
hepatitis and non-alcoholic fatty liver disease. At this time, there
is insufficient data for the referenced cut-off values and use in
other causes of liver disease, including alcoholic liver disease.
Patients, however, may be assessed by elastography and serve as
their own reference standard/baseline.

In patients with non-alcoholic liver disease, the values suggesting
compensated advanced chronic liver disease (cACLD) may be lower, and
patients may need additional testing with elasticity results of [DATE]
kPa.

Please note that abnormal hepatic elasticity and shear wave
velocities may also be identified in clinical settings other than
with hepatic fibrosis, such as: acute hepatitis, elevated right
heart and central venous pressures including use of beta blockers,
Blythe disease (Sadowski), infiltrative processes such as
mastocytosis/amyloidosis/infiltrative tumor/lymphoma, extrahepatic
cholestasis, with hyperemia in the post-prandial state, and with
liver transplantation. Correlation with patient history, laboratory
data, and clinical condition recommended.

Diagnostic Categories:

< or =5 kPa: high probability of being normal

< or =9 kPa: in the absence of other known clinical signs, rules [DATE] kPa and ?13 kPa: suggestive of cACLD, but needs further testing

>13 kPa: highly suggestive of cACLD

> or =17 kPa: highly suggestive of cACLD with an increased
probability of clinically significant portal hypertension
IMPRESSION: ULTRASOUND ABDOMEN:

Hepatic steatosis.

ULTRASOUND HEPATIC ELASTOGRAPHY:

Median kPa:

Diagnostic category:  < or = 5 kPa: high probability of being normal

## 2023-10-27 DIAGNOSIS — R531 Weakness: Secondary | ICD-10-CM | POA: Diagnosis not present

## 2023-11-08 ENCOUNTER — Other Ambulatory Visit: Payer: Self-pay | Admitting: Family Medicine

## 2023-11-08 DIAGNOSIS — E1129 Type 2 diabetes mellitus with other diabetic kidney complication: Secondary | ICD-10-CM

## 2023-11-10 DIAGNOSIS — I472 Ventricular tachycardia, unspecified: Secondary | ICD-10-CM | POA: Diagnosis not present

## 2023-11-10 DIAGNOSIS — Z95810 Presence of automatic (implantable) cardiac defibrillator: Secondary | ICD-10-CM | POA: Diagnosis not present

## 2023-12-04 ENCOUNTER — Ambulatory Visit: Payer: Medicare HMO | Admitting: Family Medicine

## 2023-12-04 NOTE — Assessment & Plan Note (Deleted)
 Using Breo for maintenance.

## 2023-12-04 NOTE — Progress Notes (Deleted)
   Established Patient Office Visit  Subjective  Patient ID: Robin Murillo, female    DOB: [birth year]  Age: 73 y.o. MRN: [id number]  No chief complaint on file.   HPI  Diabetes - no hypoglycemic events. No wounds or sores that are not healing well. No increased thirst or urination. Checking glucose at home. Taking medications as prescribed without any side effects.  Hypothyroidism - Taking medication regularly in the AM away from food and vitamins, etc. No recent change to skin, hair, or energy levels.  F/U COPD -   {History (Optional):23778}  ROS    Objective:     There were no vitals taken for this visit. {Vitals History (Optional):23777}  Physical Exam   No results found for any visits on 12/04/23.  {Labs (Optional):23779}  The 10-year ASCVD risk score (Arnett DK, et al., 2019) is: 27.9%    Assessment & Plan:   Problem List Items Addressed This Visit       Respiratory   COPD (chronic obstructive pulmonary disease) (HCC)   Using Breo for maintenance.        Endocrine   Microalbuminuria due to type 2 diabetes mellitus (HCC)   Hypothyroid   Diabetes type 2, controlled (HCC) - Primary    No follow-ups on file.    Duaine German, MD

## 2024-02-05 ENCOUNTER — Other Ambulatory Visit: Payer: Self-pay | Admitting: Family Medicine

## 2024-02-05 DIAGNOSIS — E119 Type 2 diabetes mellitus without complications: Secondary | ICD-10-CM

## 2024-02-05 DIAGNOSIS — F418 Other specified anxiety disorders: Secondary | ICD-10-CM

## 2024-02-09 DIAGNOSIS — I472 Ventricular tachycardia, unspecified: Secondary | ICD-10-CM | POA: Diagnosis not present

## 2024-02-09 DIAGNOSIS — Z95810 Presence of automatic (implantable) cardiac defibrillator: Secondary | ICD-10-CM | POA: Diagnosis not present

## 2024-02-12 ENCOUNTER — Ambulatory Visit

## 2024-02-12 VITALS — Ht 63.0 in | Wt 240.0 lb

## 2024-02-12 DIAGNOSIS — Z0000 Encounter for general adult medical examination without abnormal findings: Secondary | ICD-10-CM | POA: Diagnosis not present

## 2024-02-12 NOTE — Patient Instructions (Signed)
 Robin Murillo , Thank you for taking time to come for your Medicare Wellness Visit. I appreciate your ongoing commitment to your health goals. Please review the following plan we discussed and let me know if I can assist you in the future.   These are the goals we discussed:  Goals       Medication Management      Patient Goals/Self-Care Activities Over the next 180 days, patient will:  take medications as prescribed  Follow Up Plan: Telephone follow up appointment with care management team member scheduled for:  6-8 months       Patient Stated (pt-stated)      09/08/2020 AWV Goal: Exercise for General Health  Patient will verbalize understanding of the benefits of increased physical activity: Exercising regularly is important. It will improve your overall fitness, flexibility, and endurance. Regular exercise also will improve your overall health. It can help you control your weight, reduce stress, and improve your bone density. Over the next year, patient will increase physical activity as tolerated with a goal of at least 150 minutes of moderate physical activity per week.  You can tell that you are exercising at a moderate intensity if your heart starts beating faster and you start breathing faster but can still hold a conversation. Moderate-intensity exercise ideas include: Walking 1 mile (1.6 km) in about 15 minutes Biking Hiking Golfing Dancing Water aerobics Patient will verbalize understanding of everyday activities that increase physical activity by providing examples like the following: Yard work, such as: Insurance underwriter Gardening Washing windows or floors Patient will be able to explain general safety guidelines for exercising:  Before you start a new exercise program, talk with your health care provider. Do not exercise so much that you hurt yourself, feel dizzy, or get very short of  breath. Wear comfortable clothes and wear shoes with good support. Drink plenty of water while you exercise to prevent dehydration or heat stroke. Work out until your breathing and your heartbeat get faster.       Patient Stated (pt-stated)      Would like to loose 100 lbs.      Patient Stated      Patient states she would like to lose weight.       Weight (lb) < 200 lb (90.7 kg)      Would like to loose 30 pounds and gain more strength in her legs with her cycle that sits on the floor.        This is a list of the screening recommended for you and due dates:  Health Maintenance  Topic Date Due   DEXA scan (bone density measurement)  04/19/2023   Cologuard (Stool DNA test)  09/21/2023   Eye exam for diabetics  11/21/2023   Hemoglobin A1C  02/03/2024   COVID-19 Vaccine (3 - Pfizer risk series) 05/14/2024*   Flu Shot  02/13/2024   Yearly kidney function blood test for diabetes  04/08/2024   Yearly kidney health urinalysis for diabetes  04/08/2024   Complete foot exam   04/08/2024   Medicare Annual Wellness Visit  02/11/2025   Mammogram  05/06/2025   DTaP/Tdap/Td vaccine (5 - Td or Tdap) 08/27/2033   Pneumococcal Vaccine for age over 41  Completed   Hepatitis C Screening  Completed   Zoster (Shingles) Vaccine  Completed   Hepatitis B Vaccine  Aged Out   HPV Vaccine  Aged  Out   Meningitis B Vaccine  Aged Out  *Topic was postponed. The date shown is not the original due date.

## 2024-02-12 NOTE — Progress Notes (Signed)
 Subjective:   Robin Murillo is a 73 y.o. female who presents for Medicare Annual (Subsequent) preventive examination.  Visit Complete: Virtual I connected with  [name] on 02/12/24 by a audio enabled telemedicine application and verified that I am speaking with the correct person using two identifiers.  Patient Location: Home  Provider Location: Office/Clinic  I discussed the limitations of evaluation and management by telemedicine. The patient expressed understanding and agreed to proceed.  Vital Signs: Because this visit was a virtual/telehealth visit, some criteria may be missing or patient reported. Any vitals not documented were not able to be obtained and vitals that have been documented are patient reported.  Patient Medicare AWV questionnaire was completed by the patient on n/a; I have confirmed that all information answered by patient is correct and no changes since this date.  Cardiac Risk Factors include: advanced age (>96men, >35 women);obesity (BMI >30kg/m2);Other (see comment);dyslipidemia;hypertension     Objective:    Today's Vitals   02/12/24 1302  Weight: 240 lb (108.9 kg)  Height: 5' 3 (1.6 m)   Body mass index is 42.51 kg/m.     02/12/2024    1:21 PM 01/28/2022    9:59 AM 09/08/2020    8:51 AM 05/12/2019    9:05 AM 10/12/2014    9:14 AM  Advanced Directives  Does Patient Have a Medical Advance Directive? No No No No No   Would patient like information on creating a medical advance directive? No - Patient declined No - Patient declined No - Patient declined No - Patient declined Yes - Educational materials given      Data saved with a previous flowsheet row definition     Current Medications (verified) Outpatient Encounter Medications as of 02/12/2024  Medication Sig   acetaminophen  (TYLENOL ) 325 MG tablet Take by mouth.   albuterol  (VENTOLIN  HFA) 108 (90 Base) MCG/ACT inhaler Inhale 2 puffs into the lungs every 6 (six) hours as needed for  wheezing or shortness of breath.   ALPRAZolam  (XANAX ) 0.25 MG tablet Take 1 tablet (0.25 mg total) by mouth daily as needed for anxiety.   aspirin 81 MG tablet Take 81 mg by mouth daily.   atorvastatin  (LIPITOR) 20 MG tablet TAKE 1 TABLET by mouth EVERY NIGHT.   Cholecalciferol (VITAMIN D  PO) Take 2,000 Int'l Units by mouth daily.    FLUoxetine  (PROZAC ) 40 MG capsule TAKE 1 CAPSULE TWICE DAILY   levothyroxine  (SYNTHROID ) 125 MCG tablet TAKE 1 TABLET EVERY DAY (NEED LABS)   lisinopril  (ZESTRIL ) 5 MG tablet TAKE 1 TABLET EVERY DAY (NEED MD APPOINTMENT)   magnesium oxide (MAG-OX) 400 MG tablet Take 400 mg by mouth 2 (two) times daily.   metFORMIN  (GLUCOPHAGE -XR) 500 MG 24 hr tablet TAKE 1 TABLET EVERY DAY WITH BREAKFAST   metoprolol (LOPRESSOR) 50 MG tablet Take 50 mg by mouth 2 (two) times daily.   pantoprazole  (PROTONIX ) 40 MG tablet TAKE 1 TABLET EVERY DAY   vitamin C (ASCORBIC ACID) 500 MG tablet Take 500 mg by mouth daily.   zinc gluconate 50 MG tablet Take 50 mg by mouth daily.   fluticasone  furoate-vilanterol (BREO ELLIPTA ) 100-25 MCG/ACT AEPB Inhale 1 puff into the lungs daily. (Patient not taking: Reported on 02/12/2024)   No facility-administered encounter medications on file as of 02/12/2024.    Allergies (verified) Morphine and codeine and Oxycodone   History: Past Medical History:  Diagnosis Date   Heart disease    pacemaker/defiberlator   Thyroid  disease    Past  Surgical History:  Procedure Laterality Date   CARDIAC DEFIBRILLATOR PLACEMENT     06/2010 at [facility]   CARDIAC ELECTROPHYSIOLOGY STUDY AND ABLATION     06/2010   PACEMAKER PLACEMENT     06/2010 at [facility]   Family History  Problem Relation Age of Onset   Heart attack Brother    Heart disease Sister        Murmur   Thyroid  disease Sister    Diabetes Brother    Social History   Socioeconomic History   Marital status: Married    Spouse name: Marinda   Number of children: 3   Years of  education: 12   Highest education level: 12th grade  Occupational History   Occupation: disability    Comment: office work  Tobacco Use   Smoking status: Former    Current packs/day: 0.00    Types: Cigarettes    Quit date: 04/04/2003    Years since quitting: 20.8   Smokeless tobacco: Never  Vaping Use   Vaping status: Never Used  Substance and Sexual Activity   Alcohol use: No   Drug use: No   Sexual activity: Not Currently  Other Topics Concern   Not on file  Social History Narrative   Lives with her spouse, son and 2 grand daughters. She enjoys playing word search and bingo on her tablet.   Social Drivers of Corporate investment banker Strain: Low Risk  (02/12/2024)   Overall Financial Resource Strain (CARDIA)    Difficulty of Paying Living Expenses: Not hard at all  Food Insecurity: No Food Insecurity (02/12/2024)   Hunger Vital Sign    Worried About Running Out of Food in the Last Year: Never true    Ran Out of Food in the Last Year: Never true  Transportation Needs: No Transportation Needs (02/12/2024)   PRAPARE - Administrator, Civil Service (Medical): No    Lack of Transportation (Non-Medical): No  Physical Activity: Inactive (02/12/2024)   Exercise Vital Sign    Days of Exercise per Week: 0 days    Minutes of Exercise per Session: 0 min  Stress: No Stress Concern Present (02/12/2024)   [employer] of Occupational Health - Occupational Stress Questionnaire    Feeling of Stress: Only a little  Social Connections: Moderately Isolated (02/12/2024)   Social Connection and Isolation Panel    Frequency of Communication with Friends and Family: Once a week    Frequency of Social Gatherings with Friends and Family: More than three times a week    Attends Religious Services: Never    Database administrator or Organizations: No    Attends Engineer, structural: Never    Marital Status: Married    Tobacco Counseling Counseling given: Not  Answered   Clinical Intake:  Pre-visit preparation completed: Yes  Pain : No/denies pain     BMI - recorded: 42.51 Nutritional Status: BMI > 30  Obese Nutritional Risks: None Diabetes: No  How often do you need to have someone help you when you read instructions, pamphlets, or other written materials from your doctor or pharmacy?: 1 - Never What is the last grade level you completed in school?: 12  Interpreter Needed?: No      Activities of Daily Living    02/12/2024    1:05 PM  In your present state of health, do you have any difficulty performing the following activities:  Hearing? 1  Vision? 1  Difficulty concentrating  or making decisions? 1  Walking or climbing stairs? 1  Dressing or bathing? 1  Doing errands, shopping? 0  Preparing Food and eating ? N  Using the Toilet? N  In the past six months, have you accidently leaked urine? Y  Do you have problems with loss of bowel control? N  Managing your Medications? N  Managing your Finances? N  Housekeeping or managing your Housekeeping? Y    Patient Care Team: Alvan Dorothyann BIRCH, MD as PCP - General (Family Medicine) Javaid, Adnan, MD as Referring Physician (Pulmonary Disease) Merilee Oneil Faden, MD as Referring Physician (Cardiology) Beverli Cara PARAS, [facility] (Inactive) as Pharmacist (Pharmacist) Oneita Tresea DEL, OD as Referring Physician (Optometry)  Indicate any recent Medical Services you may have received from other than [facility] providers in the past year (date may be approximate).     Assessment:   This is a routine wellness examination for Juncal.  Hearing/Vision screen No results found.   Goals Addressed             This Visit's Progress    Patient Stated       Patient states she would like to lose weight.        Depression Screen    02/12/2024    1:16 PM 04/09/2023   10:03 AM 04/24/2022   10:49 AM 01/28/2022   10:00 AM 01/21/2022    9:54 AM 10/02/2021    9:47 AM 06/04/2021    9:28 AM   PHQ 2/9 Scores  PHQ - 2 Score 3 2 3 1 4 4 2   PHQ- 9 Score 8 12 10 6 9 12 5     Fall Risk    02/12/2024    1:21 PM 04/09/2023   10:02 AM 04/24/2022   10:47 AM 01/28/2022    9:59 AM 01/21/2022    9:54 AM  Fall Risk   Falls in the past year? 1 1 1 1 1   Number falls in past yr: 1 1 0 0 1  Injury with Fall? 1 1 0 1 0  Risk for fall due to : Impaired mobility;Impaired balance/gait Impaired balance/gait History of fall(s) History of fall(s);Impaired mobility History of fall(s)  Follow up Falls evaluation completed Falls evaluation completed Falls evaluation completed  Falls evaluation completed;Education provided;Falls prevention discussed  Falls prevention discussed      Data saved with a previous flowsheet row definition     MEDICARE RISK AT HOME: Medicare Risk at Home Any stairs in or around the home?: No Home free of loose throw rugs in walkways, pet beds, electrical cords, etc?: Yes Adequate lighting in your home to reduce risk of falls?: Yes Life alert?: No Use of a cane, walker or w/c?: Yes Grab bars in the bathroom?: No Shower chair or bench in shower?: Yes Elevated toilet seat or a handicapped toilet?: No  TIMED UP AND GO:  Was the test performed?  No    Cognitive Function:        02/12/2024    1:23 PM 01/28/2022   10:12 AM 09/08/2020    9:07 AM 05/12/2019    9:10 AM  6CIT Screen  What Year? 0 points 0 points 0 points 0 points  What month? 0 points 0 points 0 points 0 points  What time? 0 points 0 points 0 points 0 points  Count back from 20 0 points 0 points 0 points 0 points  Months in reverse 0 points 0 points 0 points 0 points  Repeat phrase 0 points  0 points 0 points 0 points  Total Score 0 points 0 points 0 points 0 points    Immunizations Immunization History  Administered Date(s) Administered   Fluad Quad(high Dose 65+) 05/04/2019, 05/29/2020, 06/04/2021, 04/17/2022   Fluad Trivalent(High Dose 65+) 04/09/2023   Influenza, High Dose Seasonal PF  04/24/2017, 04/02/2018   Influenza,inj,Quad PF,6+ Mos 04/27/2014, 05/05/2015, 04/19/2016   Influenza-Unspecified 04/08/2013, 05/04/2019, 05/29/2020   PFIZER(Purple Top)SARS-COV-2 Vaccination 08/10/2019, 08/27/2019   Pneumococcal Conjugate-13 01/15/2016   Pneumococcal Polysaccharide-23 04/21/2013, 09/21/2020   Td 05/10/2008   Td (Adult), 2 Lf Tetanus Toxid, Preservative Free 05/10/2008   Tdap 05/10/2008, 08/28/2023   Zoster Recombinant(Shingrix) 10/12/2020, 12/29/2020   Zoster, Live 10/01/2012    TDAP status: Up to date  Flu Vaccine status: Up to date  Pneumococcal vaccine status: Up to date  Covid-19 vaccine status: Information provided on how to obtain vaccines.   Qualifies for Shingles Vaccine? Yes   Zostavax completed Yes   Shingrix Completed?: Yes  Screening Tests Health Maintenance  Topic Date Due   DEXA SCAN  04/19/2023   Fecal DNA (Cologuard)  09/21/2023   OPHTHALMOLOGY EXAM  11/21/2023   HEMOGLOBIN A1C  02/03/2024   COVID-19 Vaccine (3 - Pfizer risk series) 05/14/2024 (Originally 09/24/2019)   INFLUENZA VACCINE  02/13/2024   Diabetic kidney evaluation - eGFR measurement  04/08/2024   Diabetic kidney evaluation - Urine ACR  04/08/2024   FOOT EXAM  04/08/2024   Medicare Annual Wellness (AWV)  02/11/2025   MAMMOGRAM  05/06/2025   DTaP/Tdap/Td (5 - Td or Tdap) 08/27/2033   Pneumococcal Vaccine: 50+ Years  Completed   Hepatitis C Screening  Completed   Zoster Vaccines- Shingrix  Completed   Hepatitis B Vaccines  Aged Out   HPV VACCINES  Aged Out   Meningococcal B Vaccine  Aged Out    Health Maintenance  Health Maintenance Due  Topic Date Due   DEXA SCAN  04/19/2023   Fecal DNA (Cologuard)  09/21/2023   OPHTHALMOLOGY EXAM  11/21/2023   HEMOGLOBIN A1C  02/03/2024   Colorectal cancer screening - patient declined colonoscopy   Mammogram status: Completed 05/07/2023. Repeat every year  Bone Density status: Ordered 02/12/2024. Pt provided with contact info and  advised to call to schedule appt.  Lung Cancer Screening: (Low Dose CT Chest recommended if Age 23-80 years, 20 pack-year currently smoking OR have quit w/in 15years.) does not qualify.   Lung Cancer Screening Referral: n/a  Additional Screening:  Hepatitis C Screening: does qualify; Completed 09/29/2014  Vision Screening: Recommended annual ophthalmology exams for early detection of glaucoma and other disorders of the eye. Is the patient up to date with their annual eye exam?  No  She states she has it checked every 2 years. Who is the provider or what is the name of the office in which the patient attends annual eye exams? [facility] If pt is not established with a provider, would they like to be referred to a provider to establish care? N/a.   Dental Screening: Recommended annual dental exams for proper oral hygiene  Diabetic Foot Exam: Diabetic Foot Exam: Completed 04/09/2023  Community Resource Referral / Chronic Care Management: CRR required this visit?  No   CCM required this visit?  No     Plan:     I have personally reviewed and noted the following in the patient's chart:   Medical and social history Use of alcohol, tobacco or illicit drugs  Current medications and supplements including opioid prescriptions. Patient is  not currently taking opioid prescriptions. Functional ability and status Nutritional status Physical activity Advanced directives List of other physicians Hospitalizations # 0, surgeries # 0, and ER # 1 visits in previous 12 months Vitals Screenings to include cognitive, depression, and falls Referrals and appointments  In addition, I have reviewed and discussed with patient certain preventive protocols, quality metrics, and best practice recommendations. A written personalized care plan for preventive services as well as general preventive health recommendations were provided to patient.     Bonny Jon Mayor, CMA   02/12/2024   After Visit  Summary: (MyChart) Due to this being a telephonic visit, the after visit summary with patients personalized plan was offered to patient via MyChart   Nurse Notes:   Timmia Cogburn is a 73 y.o. female patient of Metheney, Dorothyann BIRCH, MD who had a Medicare Annual Wellness Visit today via telephone. Meron is Retired and Disabled and lives with their spouse, son and 2 granddaughters. She has 3 children. She reports that she is socially active and does interact with friends/family regularly. She is minimally physically active and enjoys playing word search and bingo on her tablet. She does have some balance issues. She has been to physical therapy in the past but cannot afford to go.

## 2024-03-29 ENCOUNTER — Other Ambulatory Visit: Payer: Self-pay | Admitting: Family Medicine

## 2024-03-29 DIAGNOSIS — J069 Acute upper respiratory infection, unspecified: Secondary | ICD-10-CM

## 2024-03-31 NOTE — Telephone Encounter (Unsigned)
 Copied from CRM [id number]. Topic: Clinical - Request for Lab/Test Order >> Mar 31, 2024  1:44 PM Zane F wrote: Reason for CRM:   Patient's husband, Mr. Benn is calling in to request the patient's flu vaccine be ordered so they can have them administered at his next appointment on 04/12/2024.   Please contact the patient's husband when the order has been placed.  Callback Number: [phone]

## 2024-03-31 NOTE — Telephone Encounter (Signed)
 Spoke with spouse, he was made aware that pt was put on for a nurse visit on 04/12/24 so she could get her flu shot while at his appt with him. He verbalized understanding.

## 2024-04-12 ENCOUNTER — Ambulatory Visit (INDEPENDENT_AMBULATORY_CARE_PROVIDER_SITE_OTHER)

## 2024-04-12 ENCOUNTER — Ambulatory Visit

## 2024-04-12 ENCOUNTER — Telehealth: Payer: Self-pay | Admitting: Family Medicine

## 2024-04-12 VITALS — Temp 99.3°F

## 2024-04-12 DIAGNOSIS — Z23 Encounter for immunization: Secondary | ICD-10-CM | POA: Diagnosis not present

## 2024-04-12 NOTE — Telephone Encounter (Signed)
 Pt rescheduled missed appointment for 05/10/2024 at 1:20 pm with Dr. Alvan. Pt wants to know if she can come in early for labs. Please advise.

## 2024-04-12 NOTE — Progress Notes (Signed)
 Patient is in office today for a nurse visit to receive a High Dose Flu vaccine. Injection was given in the  Right deltoid. Patient tolerated injection well. Per Dr. Alvan it was okay to give patient immunization today.

## 2024-04-12 NOTE — Progress Notes (Deleted)
 Pt here for her HD flu vaccine. Pt denies fever, chill, medication issues, and SOB. Pt given injection in his LD RD tolerated well. No redness or swelling noted at the site. Pt advised to RTC PRN

## 2024-04-13 ENCOUNTER — Ambulatory Visit

## 2024-04-15 ENCOUNTER — Other Ambulatory Visit: Payer: Self-pay | Admitting: Family Medicine

## 2024-04-15 DIAGNOSIS — Z1231 Encounter for screening mammogram for malignant neoplasm of breast: Secondary | ICD-10-CM

## 2024-04-15 NOTE — Telephone Encounter (Signed)
 Not this time because she may need additional labs that may not get ordered if she gets them done now. Will wait until her appointment.

## 2024-04-15 NOTE — Telephone Encounter (Signed)
 Contacted patient ,she is aware that labs will be ordered on the day of her appointment.

## 2024-04-16 NOTE — Progress Notes (Signed)
 Robin Murillo                                          MRN: [id number]   04/16/2024   The [facility] Quality Team Specialist reviewed this patient medical record for the purposes of chart review for care gap closure. The following were reviewed: chart review for care gap closure-kidney health evaluation for diabetes:eGFR  and uACR.    [facility]BCI Quality Team

## 2024-04-29 ENCOUNTER — Ambulatory Visit: Payer: Self-pay

## 2024-04-29 NOTE — Telephone Encounter (Signed)
 FYI Only or Action Required?: Action required by provider: update on patient condition.  Patient was last seen in primary care on 10/14/2023 by Alvan Dorothyann BIRCH, MD.  Called Nurse Triage reporting Back Pain.  Symptoms began several days ago.  Interventions attempted: Rest, hydration, or home remedies.  Symptoms are: gradually worsening.  Triage Disposition: Go to ED Now (Notify PCP)  Patient/caregiver understands and will follow disposition?: Unsure         Copied from CRM [id number]. Topic: Clinical - Red Word Triage >> Apr 29, 2024  2:29 PM Emylou G wrote: [facility] that prompted transfer to Nurse Triage: spasms in her back.. full back pain.. can't breathe deeply w/o pain.SABRA just started hurting w/ no fall? Reason for Disposition  [1] SEVERE back pain (e.g., excruciating) AND [2] sudden onset AND [3] age > 60 years  Answer Assessment - Initial Assessment Questions 1. ONSET: When did the pain begin? (e.g., minutes, hours, days)     Monday 2. LOCATION: Where does it hurt? (upper, mid or lower back)     Shoulder area 3. SEVERITY: How bad is the pain?  (e.g., Scale 1-10; mild, moderate, or severe)     10/10 with movement 4. PATTERN: Is the pain constant? (e.g., yes, no; constant, intermittent)      Yes, spasm-ing 5. RADIATION: Does the pain shoot into your legs or somewhere else?     denies 6. CAUSE:  What do you think is causing the back pain?      unknown 7. BACK OVERUSE:  Any recent lifting of heavy objects, strenuous work or exercise?     denies 8. MEDICINES: What have you taken so far for the pain? (e.g., nothing, acetaminophen , NSAIDS)     Ibuprofen - minimal relief 9. NEUROLOGIC SYMPTOMS: Do you have any weakness, numbness, or problems with bowel/bladder control?     denies 10. OTHER SYMPTOMS: Do you have any other symptoms? (e.g., fever, abdomen pain, burning with urination, blood in urine)       Denies. Endorses being hot 11. PREGNANCY:  Is there any chance you are pregnant? When was your last menstrual period?       N/a  Protocols used: Back Pain-A-AH

## 2024-04-29 NOTE — Telephone Encounter (Signed)
 Spoke with patient. She states she has had sudden onset of pain with movement, raising her arms and deep inhalation. States pain does not let up.  She did not want to go to ER  Spoke with Dr. Alvan - she mentioned that she is concerned over the sudden onset of symptoms and pain not letting up. She would recommend she at least be seen at Urgent care and someone there could at least evaluate her to see  if the ER is needed or what could be done to help with the pain.  Her husband is picking up her daughter from school . She will discuss him taking her to urgent care when he returns.

## 2024-05-02 DIAGNOSIS — M4186 Other forms of scoliosis, lumbar region: Secondary | ICD-10-CM | POA: Diagnosis not present

## 2024-05-02 DIAGNOSIS — E119 Type 2 diabetes mellitus without complications: Secondary | ICD-10-CM | POA: Diagnosis not present

## 2024-05-02 DIAGNOSIS — J449 Chronic obstructive pulmonary disease, unspecified: Secondary | ICD-10-CM | POA: Diagnosis not present

## 2024-05-02 DIAGNOSIS — I472 Ventricular tachycardia, unspecified: Secondary | ICD-10-CM | POA: Diagnosis not present

## 2024-05-02 DIAGNOSIS — M4184 Other forms of scoliosis, thoracic region: Secondary | ICD-10-CM | POA: Diagnosis not present

## 2024-05-02 DIAGNOSIS — M889 Osteitis deformans of unspecified bone: Secondary | ICD-10-CM | POA: Diagnosis not present

## 2024-05-02 DIAGNOSIS — R0602 Shortness of breath: Secondary | ICD-10-CM | POA: Diagnosis not present

## 2024-05-02 DIAGNOSIS — M47816 Spondylosis without myelopathy or radiculopathy, lumbar region: Secondary | ICD-10-CM | POA: Diagnosis not present

## 2024-05-02 DIAGNOSIS — E079 Disorder of thyroid, unspecified: Secondary | ICD-10-CM | POA: Diagnosis not present

## 2024-05-02 DIAGNOSIS — Z87891 Personal history of nicotine dependence: Secondary | ICD-10-CM | POA: Diagnosis not present

## 2024-05-02 DIAGNOSIS — I10 Essential (primary) hypertension: Secondary | ICD-10-CM | POA: Diagnosis not present

## 2024-05-02 DIAGNOSIS — M47814 Spondylosis without myelopathy or radiculopathy, thoracic region: Secondary | ICD-10-CM | POA: Diagnosis not present

## 2024-05-02 DIAGNOSIS — M5416 Radiculopathy, lumbar region: Secondary | ICD-10-CM | POA: Diagnosis not present

## 2024-05-02 DIAGNOSIS — M545 Low back pain, unspecified: Secondary | ICD-10-CM | POA: Diagnosis not present

## 2024-05-02 DIAGNOSIS — M79602 Pain in left arm: Secondary | ICD-10-CM | POA: Diagnosis not present

## 2024-05-02 DIAGNOSIS — M546 Pain in thoracic spine: Secondary | ICD-10-CM | POA: Diagnosis not present

## 2024-05-05 ENCOUNTER — Other Ambulatory Visit: Payer: Self-pay | Admitting: Family Medicine

## 2024-05-07 ENCOUNTER — Telehealth: Payer: Self-pay

## 2024-05-07 DIAGNOSIS — E559 Vitamin D deficiency, unspecified: Secondary | ICD-10-CM

## 2024-05-07 DIAGNOSIS — E119 Type 2 diabetes mellitus without complications: Secondary | ICD-10-CM

## 2024-05-07 DIAGNOSIS — E1129 Type 2 diabetes mellitus with other diabetic kidney complication: Secondary | ICD-10-CM

## 2024-05-07 DIAGNOSIS — M8580 Other specified disorders of bone density and structure, unspecified site: Secondary | ICD-10-CM

## 2024-05-07 NOTE — Telephone Encounter (Signed)
 Copied from CRM [id number]. Topic: Clinical - Lab/Test Results >> May 06, 2024 10:44 AM Charlet HERO wrote: Reason for CRM: Patient is calling to have the lab work paper sent to the lab so she can get the blood work done the same day of her appt 10/27 @ 1:20 so she can get it done that morning.

## 2024-05-07 NOTE — Telephone Encounter (Signed)
 Patient advised.

## 2024-05-07 NOTE — Telephone Encounter (Signed)
 Orders Placed This Encounter  Procedures   Hemoglobin A1c   CMP14+EGFR   Urine Microalbumin w/creat. ratio   Lipid panel   VITAMIN D  25 Hydroxy (Vit-D Deficiency, Fractures)

## 2024-05-10 ENCOUNTER — Ambulatory Visit (INDEPENDENT_AMBULATORY_CARE_PROVIDER_SITE_OTHER): Admitting: Family Medicine

## 2024-05-10 ENCOUNTER — Encounter: Payer: Self-pay | Admitting: Family Medicine

## 2024-05-10 VITALS — BP 113/56 | HR 72 | Ht 63.0 in | Wt 231.1 lb

## 2024-05-10 DIAGNOSIS — E1129 Type 2 diabetes mellitus with other diabetic kidney complication: Secondary | ICD-10-CM | POA: Diagnosis not present

## 2024-05-10 DIAGNOSIS — E119 Type 2 diabetes mellitus without complications: Secondary | ICD-10-CM

## 2024-05-10 DIAGNOSIS — E559 Vitamin D deficiency, unspecified: Secondary | ICD-10-CM

## 2024-05-10 DIAGNOSIS — E039 Hypothyroidism, unspecified: Secondary | ICD-10-CM

## 2024-05-10 DIAGNOSIS — J449 Chronic obstructive pulmonary disease, unspecified: Secondary | ICD-10-CM | POA: Diagnosis not present

## 2024-05-10 DIAGNOSIS — R809 Proteinuria, unspecified: Secondary | ICD-10-CM | POA: Diagnosis not present

## 2024-05-10 DIAGNOSIS — Z95810 Presence of automatic (implantable) cardiac defibrillator: Secondary | ICD-10-CM | POA: Diagnosis not present

## 2024-05-10 DIAGNOSIS — I472 Ventricular tachycardia, unspecified: Secondary | ICD-10-CM

## 2024-05-10 DIAGNOSIS — Z7984 Long term (current) use of oral hypoglycemic drugs: Secondary | ICD-10-CM | POA: Diagnosis not present

## 2024-05-10 LAB — POCT GLYCOSYLATED HEMOGLOBIN (HGB A1C): Hemoglobin A1C: 5.7 % — AB (ref 4.0–5.6)

## 2024-05-10 LAB — POCT UA - MICROALBUMIN
Creatinine, POC: 300 mg/dL
Microalbumin Ur, POC: 150 mg/L

## 2024-05-10 MED ORDER — ALBUTEROL SULFATE HFA 108 (90 BASE) MCG/ACT IN AERS: 2.0000 | INHALATION_SPRAY | Freq: Four times a day (QID) | RESPIRATORY_TRACT | 3 refills | Status: AC | PRN

## 2024-05-10 NOTE — Assessment & Plan Note (Signed)
 Due to recheck TSH.

## 2024-05-10 NOTE — Assessment & Plan Note (Addendum)
 Type 2 diabetes mellitus without complications Type 2 diabetes well-controlled with A1c of 5.7%. Continue statin and ASA On metformin  and ACE and statin.  Urine micro due today

## 2024-05-10 NOTE — Progress Notes (Signed)
 Established Patient Office Visit  Patient ID: Robin Murillo, female    DOB: 11-22-1950  Age: 73 y.o. MRN: [id number] PCP: Alvan Dorothyann BIRCH, MD  Chief Complaint  Patient presents with   Diabetes   Hypertension   Hypothyroidism    Subjective:     HPI  Discussed the use of AI scribe software for clinical note transcription with the patient, who gave verbal consent to proceed.  History of Present Illness Robin Murillo is a 73 year old female who presents with persistent upper back pain and muscle spasms.  Upper back pain and muscle spasms - Persistent upper back pain and muscle spasms for three weeks, located between the shoulders and shifting in location - Pain is exacerbated by certain movements, particularly twisting to the right - No improvement in pain over the past week despite treatment - Muscle relaxer prescribed in the emergency room, possibly tizanidine , caused drowsiness - No use of topical treatments such as Icy Hot or Biofreeze - No heat or ice therapy attempted - CT scan performed in the emergency room; CT angio showed no acute pulmonary embolus or thoracic aortic dissection.  She did have a 5 mm posterior right upper lobe pulmonary nodule. - No other medications used for back pain aside from the muscle relaxer - X-rays of the lumbar and thoracic spine showed some mild disc Stroh scoliosis moderate degenerative change with no sign of acute fracture or compression deformity.  Bowel habit changes - Occasional constipation managed with a stool softener provided by the hospital - Variable bowel habits, including episodes of diarrhea following hospital visits  Functional limitations - Sedentary lifestyle, spending most of her time in bed watching television - Difficulty sitting for prolonged periods due to discomfort - Prefers to lean rather than sit  Visual disturbance - Difficulty with vision due to current lenses being two years old and film coming off - Eye  exam scheduled for the following day     ROS    Objective:     BP (!) 113/56   Pulse 72   Ht 5' 3 (1.6 m)   Wt 231 lb 1.3 oz (104.8 kg)   SpO2 95%   BMI 40.93 kg/m    Physical Exam Vitals and nursing note reviewed.  Constitutional:      Appearance: Normal appearance.  HENT:     Head: Normocephalic and atraumatic.  Eyes:     Conjunctiva/sclera: Conjunctivae normal.  Cardiovascular:     Rate and Rhythm: Normal rate and regular rhythm.  Pulmonary:     Effort: Pulmonary effort is normal.     Breath sounds: Normal breath sounds.  Musculoskeletal:     Comments: Normal flexion, extension, pain with rotation right, and less with rotation the leftt.  Tender over her upper thoracic spine but she does have tenderness to the right of the spine over the muscles in her mid back.  Skin:    General: Skin is warm and dry.  Neurological:     Mental Status: She is alert.  Psychiatric:        Mood and Affect: Mood normal.      Results for orders placed or performed in visit on 05/10/24  POCT HgB A1C  Result Value Ref Range   Hemoglobin A1C 5.7 (A) 4.0 - 5.6 %   HbA1c POC (<> result, manual entry)     HbA1c, POC (prediabetic range)     HbA1c, POC (controlled diabetic range)    POCT UA - Microalbumin  Result  Value Ref Range   Microalbumin Ur, POC 150 mg/L   Creatinine, POC 300 mg/dL   Albumin/Creatinine Ratio, Urine, POC 30-300       The 10-year ASCVD risk score (Arnett DK, et al., 2019) is: 23.7%    Assessment & Plan:   Problem List Items Addressed This Visit       Cardiovascular and Mediastinum   VT (ventricular tachycardia) (HCC)   On metoprolol  aymptomatic.       Relevant Orders   Lipid Panel With LDL/HDL Ratio   CMP14+EGFR   Vitamin D  (25 hydroxy)   TSH     Respiratory   COPD (chronic obstructive pulmonary disease) (HCC)   No longer on  Breo. Just using albuterol .  Needs refills.       Relevant Medications   albuterol  (VENTOLIN  HFA) 108 (90 Base)  MCG/ACT inhaler     Endocrine   Microalbuminuria due to type 2 diabetes mellitus (HCC) - Primary   Type 2 diabetes mellitus without complications Type 2 diabetes well-controlled with A1c of 5.7%. Continue statin and ASA On metformin  and ACE and statin.  Urine micro due today       Relevant Orders   POCT HgB A1C (Completed)   Lipid Panel With LDL/HDL Ratio   CMP14+EGFR   Vitamin D  (25 hydroxy)   TSH   POCT UA - Microalbumin (Completed)   Hypothyroid   Due to recheck TSH.       Relevant Orders   POCT HgB A1C (Completed)   Lipid Panel With LDL/HDL Ratio   CMP14+EGFR   Vitamin D  (25 hydroxy)   TSH     Other   Vitamin D  deficiency   Relevant Orders   POCT HgB A1C (Completed)   Lipid Panel With LDL/HDL Ratio   CMP14+EGFR   Vitamin D  (25 hydroxy)   TSH   Other Visit Diagnoses       Controlled type 2 diabetes mellitus without complication, without long-term current use of insulin (HCC)       Relevant Orders   Lipid Panel With LDL/HDL Ratio   CMP14+EGFR   Vitamin D  (25 hydroxy)   TSH       Assessment and Plan Assessment & Plan Back muscle spasm Chronic upper back muscle spasm for three weeks, exacerbated by twisting. Right paraspinal muscle tightness noted. Previous muscle relaxer ineffective. Differential includes muscle strain or spasm. - Provide stretches twice daily. - Advise heat therapy 5-10 minutes before and after stretching. - Encourage mentholated muscle rubs like Biofreeze or Icy Hot. - Advise against prolonged sitting; recommend movement every 20-30 minutes. - Consider physical therapy if no improvement.  General Health Maintenance Routine health maintenance discussed, including eye exam and colorectal cancer screening. Cologuard kit due in 2025. - Confirm eye exam scheduled for tomorrow. - Order Cologuard kit for colorectal cancer screening.    Return in about 4 months (around 09/10/2024) for Diabetes follow-up.    Dorothyann Byars, MD [facility]  Health Primary Care & Sports Medicine at [facility]

## 2024-05-10 NOTE — Assessment & Plan Note (Addendum)
 No longer on  Breo. Just using albuterol .  Needs refills.

## 2024-05-10 NOTE — Assessment & Plan Note (Signed)
 On metoprolol  aymptomatic.

## 2024-05-11 ENCOUNTER — Ambulatory Visit: Payer: Self-pay | Admitting: Family Medicine

## 2024-05-11 DIAGNOSIS — E1129 Type 2 diabetes mellitus with other diabetic kidney complication: Secondary | ICD-10-CM

## 2024-05-11 DIAGNOSIS — E039 Hypothyroidism, unspecified: Secondary | ICD-10-CM

## 2024-05-11 LAB — SPECIMEN STATUS

## 2024-05-11 NOTE — Progress Notes (Signed)
 HI Robin Murillo,  Your kidney function is stable.  Your alkaline phosphatase which is a liver function enzyme is still significantly elevated but better than it was a year ago which is great.  Your ALT is back to normal which is great but your AST is still slightly elevated.  Your thyroid  level is up as well.  We will call the lab and see if we can add a free T4 to your labs.  Cholesterol overall looks good and your vitamin D  levels look great. CBC was canceled.

## 2024-05-13 LAB — CMP14+EGFR
ALT: 32 IU/L (ref 0–32)
AST: 50 IU/L — ABNORMAL HIGH (ref 0–40)
Albumin: 4.3 g/dL (ref 3.8–4.8)
Alkaline Phosphatase: 260 IU/L — ABNORMAL HIGH (ref 49–135)
BUN/Creatinine Ratio: 10 — ABNORMAL LOW (ref 12–28)
BUN: 11 mg/dL (ref 8–27)
Bilirubin Total: 0.5 mg/dL (ref 0.0–1.2)
CO2: 22 mmol/L (ref 20–29)
Calcium: 10.1 mg/dL (ref 8.7–10.3)
Chloride: 102 mmol/L (ref 96–106)
Creatinine, Ser: 1.05 mg/dL — ABNORMAL HIGH (ref 0.57–1.00)
Globulin, Total: 3.1 g/dL (ref 1.5–4.5)
Glucose: 125 mg/dL — ABNORMAL HIGH (ref 70–99)
Potassium: 5.1 mmol/L (ref 3.5–5.2)
Sodium: 140 mmol/L (ref 134–144)
Total Protein: 7.4 g/dL (ref 6.0–8.5)
eGFR: 56 mL/min/1.73 — ABNORMAL LOW (ref >59)

## 2024-05-13 LAB — VITAMIN D 25 HYDROXY (VIT D DEFICIENCY, FRACTURES): Vit D, 25-Hydroxy: 63.9 ng/mL (ref 30.0–100.0)

## 2024-05-13 LAB — LIPID PANEL WITH LDL/HDL RATIO
Cholesterol, Total: 165 mg/dL (ref 100–199)
HDL: 52 mg/dL (ref >39)
LDL Chol Calc (NIH): 93 mg/dL (ref 0–99)
LDL/HDL Ratio: 1.8 ratio (ref 0.0–3.2)
Triglycerides: 108 mg/dL (ref 0–149)
VLDL Cholesterol Cal: 20 mg/dL (ref 5–40)

## 2024-05-13 LAB — TSH: TSH: 6.61 u[IU]/mL — ABNORMAL HIGH (ref 0.450–4.500)

## 2024-05-13 LAB — SPECIMEN STATUS REPORT

## 2024-05-14 LAB — CBC WITH DIFFERENTIAL/PLATELET
Basophils Absolute: 0.1 x10E3/uL (ref 0.0–0.2)
Basos: 1 %
EOS (ABSOLUTE): 0.3 x10E3/uL (ref 0.0–0.4)
Eos: 3 %
Hematocrit: 47.8 % — ABNORMAL HIGH (ref 34.0–46.6)
Hemoglobin: 15 g/dL (ref 11.1–15.9)
Immature Grans (Abs): 0 x10E3/uL (ref 0.0–0.1)
Immature Granulocytes: 0 %
Lymphocytes Absolute: 2.3 x10E3/uL (ref 0.7–3.1)
Lymphs: 23 %
MCH: 30.5 pg (ref 26.6–33.0)
MCHC: 31.4 g/dL — ABNORMAL LOW (ref 31.5–35.7)
MCV: 97 fL (ref 79–97)
Monocytes Absolute: 1 x10E3/uL — ABNORMAL HIGH (ref 0.1–0.9)
Monocytes: 11 %
Neutrophils Absolute: 6.1 x10E3/uL (ref 1.4–7.0)
Neutrophils: 62 %
Platelets: 336 x10E3/uL (ref 150–450)
RBC: 4.92 x10E6/uL (ref 3.77–5.28)
RDW: 12.8 % (ref 11.7–15.4)
WBC: 9.8 x10E3/uL (ref 3.4–10.8)

## 2024-05-14 LAB — SPECIMEN STATUS REPORT

## 2024-05-14 NOTE — Progress Notes (Signed)
 Free T4 looks good so lets plan to recheck the TSH and free T4 again in 3 months since it was a little borderline this time.

## 2024-05-17 NOTE — Progress Notes (Signed)
 Hi Ms. Borden, hemoglobin is stable.

## 2024-05-26 ENCOUNTER — Other Ambulatory Visit: Payer: Self-pay | Admitting: Family Medicine

## 2024-05-26 DIAGNOSIS — E119 Type 2 diabetes mellitus without complications: Secondary | ICD-10-CM

## 2024-06-04 LAB — T4, FREE: Free T4: 1.64 ng/dL (ref 0.82–1.77)

## 2024-06-04 LAB — SPECIMEN STATUS REPORT

## 2024-06-16 ENCOUNTER — Other Ambulatory Visit: Payer: Self-pay | Admitting: Family Medicine

## 2024-06-16 DIAGNOSIS — K2100 Gastro-esophageal reflux disease with esophagitis, without bleeding: Secondary | ICD-10-CM

## 2024-07-19 ENCOUNTER — Other Ambulatory Visit: Payer: Self-pay | Admitting: Family Medicine

## 2024-09-08 ENCOUNTER — Ambulatory Visit: Admitting: Family Medicine

## 2025-02-15 ENCOUNTER — Ambulatory Visit
